# Patient Record
Sex: Female | Born: 1940 | Race: White | Hispanic: No | Smoking: Former smoker
Health system: Southern US, Community
[De-identification: ages and names within clinical notes are randomized; demographics above are authoritative.]

## PROBLEM LIST (undated history)

## (undated) DIAGNOSIS — K5792 Diverticulitis of intestine, part unspecified, without perforation or abscess without bleeding: Secondary | ICD-10-CM

## (undated) DIAGNOSIS — E559 Vitamin D deficiency, unspecified: Secondary | ICD-10-CM

## (undated) DIAGNOSIS — R011 Cardiac murmur, unspecified: Secondary | ICD-10-CM

## (undated) DIAGNOSIS — H409 Unspecified glaucoma: Secondary | ICD-10-CM

## (undated) DIAGNOSIS — K5790 Diverticulosis of intestine, part unspecified, without perforation or abscess without bleeding: Secondary | ICD-10-CM

## (undated) DIAGNOSIS — J189 Pneumonia, unspecified organism: Secondary | ICD-10-CM

## (undated) DIAGNOSIS — E782 Mixed hyperlipidemia: Secondary | ICD-10-CM

## (undated) DIAGNOSIS — F32A Depression, unspecified: Secondary | ICD-10-CM

## (undated) DIAGNOSIS — K219 Gastro-esophageal reflux disease without esophagitis: Secondary | ICD-10-CM

## (undated) DIAGNOSIS — E119 Type 2 diabetes mellitus without complications: Secondary | ICD-10-CM

## (undated) DIAGNOSIS — I10 Essential (primary) hypertension: Secondary | ICD-10-CM

## (undated) DIAGNOSIS — F329 Major depressive disorder, single episode, unspecified: Secondary | ICD-10-CM

## (undated) DIAGNOSIS — D649 Anemia, unspecified: Secondary | ICD-10-CM

## (undated) DIAGNOSIS — C801 Malignant (primary) neoplasm, unspecified: Secondary | ICD-10-CM

## (undated) DIAGNOSIS — D126 Benign neoplasm of colon, unspecified: Secondary | ICD-10-CM

## (undated) HISTORY — PX: ABDOMINAL HYSTERECTOMY: SHX81

## (undated) HISTORY — DX: Gastro-esophageal reflux disease without esophagitis: K21.9

## (undated) HISTORY — DX: Mixed hyperlipidemia: E78.2

## (undated) HISTORY — DX: Essential (primary) hypertension: I10

## (undated) HISTORY — DX: Diverticulitis of intestine, part unspecified, without perforation or abscess without bleeding: K57.92

## (undated) HISTORY — DX: Diverticulosis of intestine, part unspecified, without perforation or abscess without bleeding: K57.90

## (undated) HISTORY — DX: Type 2 diabetes mellitus without complications: E11.9

## (undated) HISTORY — PX: OTHER SURGICAL HISTORY: SHX169

## (undated) HISTORY — PX: LITHOTRIPSY: SUR834

## (undated) HISTORY — DX: Depression, unspecified: F32.A

## (undated) HISTORY — PX: BREAST BIOPSY: SHX20

## (undated) HISTORY — DX: Unspecified glaucoma: H40.9

## (undated) HISTORY — PX: EYE SURGERY: SHX253

## (undated) HISTORY — DX: Major depressive disorder, single episode, unspecified: F32.9

## (undated) HISTORY — DX: Vitamin D deficiency, unspecified: E55.9

## (undated) HISTORY — DX: Benign neoplasm of colon, unspecified: D12.6

## (undated) HISTORY — DX: Anemia, unspecified: D64.9

---

## 1998-08-14 ENCOUNTER — Emergency Department (HOSPITAL_COMMUNITY): Admission: EM | Admit: 1998-08-14 | Discharge: 1998-08-14 | Payer: Self-pay | Admitting: Emergency Medicine

## 1999-08-15 ENCOUNTER — Other Ambulatory Visit: Admission: RE | Admit: 1999-08-15 | Discharge: 1999-08-15 | Payer: Self-pay | Admitting: Gynecology

## 2000-01-11 ENCOUNTER — Ambulatory Visit (HOSPITAL_COMMUNITY): Admission: RE | Admit: 2000-01-11 | Discharge: 2000-01-11 | Payer: Self-pay | Admitting: Internal Medicine

## 2000-01-11 ENCOUNTER — Encounter: Payer: Self-pay | Admitting: Internal Medicine

## 2001-02-21 ENCOUNTER — Ambulatory Visit (HOSPITAL_COMMUNITY): Admission: RE | Admit: 2001-02-21 | Discharge: 2001-02-21 | Payer: Self-pay | Admitting: Internal Medicine

## 2001-02-21 ENCOUNTER — Encounter: Payer: Self-pay | Admitting: Internal Medicine

## 2003-04-09 ENCOUNTER — Encounter: Payer: Self-pay | Admitting: Internal Medicine

## 2003-04-09 ENCOUNTER — Ambulatory Visit (HOSPITAL_COMMUNITY): Admission: RE | Admit: 2003-04-09 | Discharge: 2003-04-09 | Payer: Self-pay | Admitting: Internal Medicine

## 2003-04-26 ENCOUNTER — Other Ambulatory Visit: Admission: RE | Admit: 2003-04-26 | Discharge: 2003-04-26 | Payer: Self-pay | Admitting: Gynecology

## 2004-04-12 ENCOUNTER — Ambulatory Visit (HOSPITAL_COMMUNITY): Admission: RE | Admit: 2004-04-12 | Discharge: 2004-04-12 | Payer: Self-pay | Admitting: Internal Medicine

## 2005-04-17 ENCOUNTER — Ambulatory Visit (HOSPITAL_COMMUNITY): Admission: RE | Admit: 2005-04-17 | Discharge: 2005-04-17 | Payer: Self-pay | Admitting: Internal Medicine

## 2005-06-23 ENCOUNTER — Emergency Department (HOSPITAL_COMMUNITY): Admission: EM | Admit: 2005-06-23 | Discharge: 2005-06-23 | Payer: Self-pay | Admitting: Emergency Medicine

## 2005-06-25 ENCOUNTER — Ambulatory Visit (HOSPITAL_COMMUNITY): Admission: RE | Admit: 2005-06-25 | Discharge: 2005-06-25 | Payer: Self-pay | Admitting: Internal Medicine

## 2005-08-13 ENCOUNTER — Ambulatory Visit (HOSPITAL_COMMUNITY): Admission: RE | Admit: 2005-08-13 | Discharge: 2005-08-13 | Payer: Self-pay | Admitting: Internal Medicine

## 2005-12-31 ENCOUNTER — Other Ambulatory Visit: Admission: RE | Admit: 2005-12-31 | Discharge: 2005-12-31 | Payer: Self-pay | Admitting: Gynecology

## 2006-04-22 ENCOUNTER — Ambulatory Visit (HOSPITAL_COMMUNITY): Admission: RE | Admit: 2006-04-22 | Discharge: 2006-04-22 | Payer: Self-pay | Admitting: Internal Medicine

## 2007-04-24 ENCOUNTER — Ambulatory Visit (HOSPITAL_COMMUNITY): Admission: RE | Admit: 2007-04-24 | Discharge: 2007-04-24 | Payer: Self-pay | Admitting: Internal Medicine

## 2007-07-02 ENCOUNTER — Ambulatory Visit: Payer: Self-pay | Admitting: Internal Medicine

## 2007-07-17 ENCOUNTER — Ambulatory Visit: Payer: Self-pay | Admitting: Internal Medicine

## 2007-07-17 ENCOUNTER — Encounter: Payer: Self-pay | Admitting: Internal Medicine

## 2008-06-14 ENCOUNTER — Ambulatory Visit (HOSPITAL_COMMUNITY): Admission: RE | Admit: 2008-06-14 | Discharge: 2008-06-14 | Payer: Self-pay | Admitting: Internal Medicine

## 2008-09-27 ENCOUNTER — Ambulatory Visit: Payer: Self-pay

## 2008-09-27 ENCOUNTER — Encounter (INDEPENDENT_AMBULATORY_CARE_PROVIDER_SITE_OTHER): Payer: Self-pay | Admitting: Internal Medicine

## 2010-05-10 ENCOUNTER — Ambulatory Visit (HOSPITAL_COMMUNITY): Admission: RE | Admit: 2010-05-10 | Discharge: 2010-05-10 | Payer: Self-pay | Admitting: Internal Medicine

## 2011-04-09 ENCOUNTER — Other Ambulatory Visit (HOSPITAL_COMMUNITY): Payer: Self-pay | Admitting: Orthopedic Surgery

## 2011-04-09 DIAGNOSIS — R52 Pain, unspecified: Secondary | ICD-10-CM

## 2011-04-12 ENCOUNTER — Ambulatory Visit (HOSPITAL_COMMUNITY)
Admission: RE | Admit: 2011-04-12 | Discharge: 2011-04-12 | Disposition: A | Discharge status: Home / Self Care | Payer: Medicare Other | Source: Ambulatory Visit | Attending: Orthopedic Surgery | Admitting: Orthopedic Surgery

## 2011-04-12 DIAGNOSIS — Z8781 Personal history of (healed) traumatic fracture: Secondary | ICD-10-CM | POA: Insufficient documentation

## 2011-04-12 DIAGNOSIS — M25579 Pain in unspecified ankle and joints of unspecified foot: Secondary | ICD-10-CM | POA: Insufficient documentation

## 2011-04-12 DIAGNOSIS — Z13820 Encounter for screening for osteoporosis: Secondary | ICD-10-CM | POA: Insufficient documentation

## 2011-04-12 DIAGNOSIS — R52 Pain, unspecified: Secondary | ICD-10-CM

## 2011-08-15 ENCOUNTER — Other Ambulatory Visit (HOSPITAL_COMMUNITY): Payer: Self-pay | Admitting: Internal Medicine

## 2011-08-15 ENCOUNTER — Ambulatory Visit (HOSPITAL_COMMUNITY)
Admission: RE | Admit: 2011-08-15 | Discharge: 2011-08-15 | Disposition: A | Discharge status: Home / Self Care | Payer: Medicare Other | Source: Ambulatory Visit | Attending: Internal Medicine | Admitting: Internal Medicine

## 2011-08-15 DIAGNOSIS — I10 Essential (primary) hypertension: Secondary | ICD-10-CM

## 2011-08-15 DIAGNOSIS — Z0000 Encounter for general adult medical examination without abnormal findings: Secondary | ICD-10-CM | POA: Insufficient documentation

## 2011-08-15 DIAGNOSIS — E119 Type 2 diabetes mellitus without complications: Secondary | ICD-10-CM | POA: Insufficient documentation

## 2011-08-21 ENCOUNTER — Encounter: Payer: Self-pay | Admitting: Internal Medicine

## 2011-10-03 ENCOUNTER — Other Ambulatory Visit: Payer: Self-pay | Admitting: Dermatology

## 2011-10-12 ENCOUNTER — Encounter: Payer: Self-pay | Admitting: Internal Medicine

## 2011-10-17 ENCOUNTER — Ambulatory Visit (INDEPENDENT_AMBULATORY_CARE_PROVIDER_SITE_OTHER): Payer: Medicare Other | Admitting: Internal Medicine

## 2011-10-17 ENCOUNTER — Encounter: Payer: Self-pay | Admitting: Internal Medicine

## 2011-10-17 VITALS — BP 132/70 | HR 77 | Ht 66.0 in | Wt 129.9 lb

## 2011-10-17 DIAGNOSIS — R195 Other fecal abnormalities: Secondary | ICD-10-CM

## 2011-10-17 MED ORDER — PEG-KCL-NACL-NASULF-NA ASC-C 100 G PO SOLR: 1.0000 | Freq: Once | ORAL | Status: DC

## 2011-10-17 NOTE — Progress Notes (Signed)
Brandy Short July 15, 1941 MRN [id number]   History of Present Illness:  This is a 70 year old white female with one out of three stool cards positive for blood. She denies visible blood per rectum or abdominal pain. She has occasional diarrhea which she attributes to irritable bowel syndrome. She is a diabetic on metformin which may be causing diarrhea. She had a colonoscopy in 2005 with findings of an adenomatous polyp. She also had severe diverticulosis of the sigmoid colon. A repeat colonoscopy in July 2008 showed a small polyp which was shown to be polypoid mucosa. She says that she collected her Hemoccult cards several days after she ate a rare roast beef. She also points out that she has some rectal itching at times which may be causing local irritation.    Past Medical History  Diagnosis Date  . Hypertension   . Type II or unspecified type diabetes mellitus without mention of complication, not stated as uncontrolled   . Mixed hyperlipidemia   . Depression   . Diverticulosis   . Colon polyps   . Asthma    Past Surgical History  Procedure Date  . Abdominal hysterectomy   . Breast biopsy   . Cyst removed from lower spine   . Lithotripsy     reports that she quit smoking about 12 years ago. She has never used smokeless tobacco. She reports that she drinks alcohol. She reports that she does not use illicit drugs. family history includes Breast cancer in her sister; Diabetes in her mother; and Hyperlipidemia in her sister. Allergies  Allergen Reactions  . Dilaudid (Hydromorphone Hcl)     rash  . Quinine Derivatives   . Sulfur     Breaks out into a rash  . Tetracyclines & Related     rash        Review of Systems: She denies any upper GI symptoms of heartburn epigastric pain all melenic stools  The remainder of the 10 point ROS is negative except as outlined in H&P   Physical Exam: General appearance  Well developed, in no distress. Eyes- non icteric. HEENT  nontraumatic, normocephalic. Mouth no lesions, tongue papillated, no cheilosis. Neck supple without adenopathy, thyroid not enlarged, no carotid bruits, no JVD. Lungs Clear to auscultation bilaterally. Cor normal S1, normal S2, regular rhythm, no murmur,  quiet precordium. Abdomen: Soft nontender with normal active bowel sounds. No tenderness. Liver edge at costal margin. Rectal: Normal perianal area. Normal rectal sphincter tone. Hemoccult negative stool. Extremities no pedal edema. Skin no lesions. Neurological alert and oriented x 3. Psychological normal mood and affect.  Assessment and Plan:  Problem # 1 Hemoccult-positive stool on a home test. Patient denies any visible blood. A positive test could not be reproduced on today's rectal exam. She has a personal history of adenomatous polyps. We have discussed at length advantages of colonoscopy versus virtual colonoscopy. We also considered the possibility of repeating the Hemoccult cards. Apparently, she is not anemic. She would like to go ahead and have a colonoscopy now instead of at a recall date in 2015. We have discussed her prep and also modification of her diabetic medications prior to the colonoscopy.   10/17/2011 [name]

## 2011-10-17 NOTE — Patient Instructions (Signed)
You have been scheduled for a colonoscopy. Please follow written instructions given to you at your visit today.  Please pick up your prep kit at the pharmacy within the next 2-3 days. CC: Dr Oneta Rack

## 2011-10-25 ENCOUNTER — Encounter: Payer: Self-pay | Admitting: Internal Medicine

## 2011-10-25 ENCOUNTER — Ambulatory Visit (AMBULATORY_SURGERY_CENTER): Payer: Medicare Other | Admitting: Internal Medicine

## 2011-10-25 VITALS — BP 139/68 | HR 67 | Temp 96.9°F | Resp 18 | Ht 66.0 in | Wt 129.0 lb

## 2011-10-25 DIAGNOSIS — K921 Melena: Secondary | ICD-10-CM

## 2011-10-25 DIAGNOSIS — D126 Benign neoplasm of colon, unspecified: Secondary | ICD-10-CM

## 2011-10-25 DIAGNOSIS — R195 Other fecal abnormalities: Secondary | ICD-10-CM

## 2011-10-25 LAB — GLUCOSE, CAPILLARY

## 2011-10-25 MED ORDER — SODIUM CHLORIDE 0.9 % IV SOLN: 500.0000 mL | INTRAVENOUS | Status: DC

## 2011-10-25 NOTE — Patient Instructions (Signed)
PLease read the handouts given to you by your recovery room nurse.    You need to increase the fiber in your diet due to the severe diverticulosis in your colon.   You may resume your routine medications today.    The polyp results will be mailed to you within two weeks.   Metamucil 1 tsp would be good for you every day.   Thank-you for choosing [facility] today for your medical needs.

## 2011-10-26 ENCOUNTER — Telehealth: Payer: Self-pay | Admitting: *Deleted

## 2011-10-26 NOTE — Telephone Encounter (Signed)

## 2011-10-30 ENCOUNTER — Encounter: Payer: Self-pay | Admitting: Internal Medicine

## 2011-11-07 ENCOUNTER — Telehealth: Payer: Self-pay | Admitting: Internal Medicine

## 2011-11-07 NOTE — Telephone Encounter (Signed)
Spoke with patient and reviewed letter she was sent re: pathology. Faxed a copy of path to Dr. Evern Bio as requested by patient.

## 2012-01-08 ENCOUNTER — Other Ambulatory Visit: Payer: Self-pay | Admitting: Dermatology

## 2012-06-04 ENCOUNTER — Other Ambulatory Visit: Payer: Self-pay

## 2012-06-17 ENCOUNTER — Other Ambulatory Visit: Payer: Self-pay | Admitting: Dermatology

## 2012-07-28 ENCOUNTER — Other Ambulatory Visit: Payer: Self-pay

## 2012-08-14 ENCOUNTER — Other Ambulatory Visit: Payer: Self-pay | Admitting: Dermatology

## 2012-08-29 ENCOUNTER — Other Ambulatory Visit: Payer: Self-pay

## 2012-09-04 ENCOUNTER — Other Ambulatory Visit: Payer: Self-pay | Admitting: Dermatology

## 2012-09-15 ENCOUNTER — Other Ambulatory Visit: Payer: Self-pay | Admitting: Dermatology

## 2012-09-24 ENCOUNTER — Other Ambulatory Visit: Payer: Self-pay | Admitting: Dermatology

## 2012-11-28 ENCOUNTER — Other Ambulatory Visit: Payer: Self-pay

## 2013-01-08 ENCOUNTER — Other Ambulatory Visit: Payer: Self-pay | Admitting: Dermatology

## 2013-01-28 ENCOUNTER — Other Ambulatory Visit: Payer: Self-pay

## 2013-02-16 ENCOUNTER — Other Ambulatory Visit: Payer: Self-pay | Admitting: Dermatology

## 2013-02-18 ENCOUNTER — Emergency Department (HOSPITAL_COMMUNITY)
Admission: EM | Admit: 2013-02-18 | Discharge: 2013-02-19 | Disposition: A | Discharge status: Home / Self Care | Payer: Medicare Other | Attending: Emergency Medicine | Admitting: Emergency Medicine

## 2013-02-18 ENCOUNTER — Encounter (HOSPITAL_COMMUNITY): Payer: Self-pay | Admitting: Emergency Medicine

## 2013-02-18 DIAGNOSIS — Y929 Unspecified place or not applicable: Secondary | ICD-10-CM | POA: Insufficient documentation

## 2013-02-18 DIAGNOSIS — W010XXA Fall on same level from slipping, tripping and stumbling without subsequent striking against object, initial encounter: Secondary | ICD-10-CM | POA: Insufficient documentation

## 2013-02-18 DIAGNOSIS — IMO0002 Reserved for concepts with insufficient information to code with codable children: Secondary | ICD-10-CM | POA: Insufficient documentation

## 2013-02-18 DIAGNOSIS — N390 Urinary tract infection, site not specified: Secondary | ICD-10-CM | POA: Insufficient documentation

## 2013-02-18 DIAGNOSIS — Z87891 Personal history of nicotine dependence: Secondary | ICD-10-CM | POA: Insufficient documentation

## 2013-02-18 DIAGNOSIS — Z860100 Personal history of colon polyps, unspecified: Secondary | ICD-10-CM | POA: Insufficient documentation

## 2013-02-18 DIAGNOSIS — Z79899 Other long term (current) drug therapy: Secondary | ICD-10-CM | POA: Insufficient documentation

## 2013-02-18 DIAGNOSIS — S0993XA Unspecified injury of face, initial encounter: Secondary | ICD-10-CM | POA: Insufficient documentation

## 2013-02-18 DIAGNOSIS — I10 Essential (primary) hypertension: Secondary | ICD-10-CM | POA: Insufficient documentation

## 2013-02-18 DIAGNOSIS — Z8659 Personal history of other mental and behavioral disorders: Secondary | ICD-10-CM | POA: Insufficient documentation

## 2013-02-18 DIAGNOSIS — Y939 Activity, unspecified: Secondary | ICD-10-CM | POA: Insufficient documentation

## 2013-02-18 DIAGNOSIS — E119 Type 2 diabetes mellitus without complications: Secondary | ICD-10-CM | POA: Insufficient documentation

## 2013-02-18 DIAGNOSIS — Z8719 Personal history of other diseases of the digestive system: Secondary | ICD-10-CM | POA: Insufficient documentation

## 2013-02-18 DIAGNOSIS — E782 Mixed hyperlipidemia: Secondary | ICD-10-CM | POA: Insufficient documentation

## 2013-02-18 DIAGNOSIS — J45909 Unspecified asthma, uncomplicated: Secondary | ICD-10-CM | POA: Insufficient documentation

## 2013-02-18 LAB — CBC
HCT: 34.7 % — ABNORMAL LOW (ref 36.0–46.0)
Hemoglobin: 12.2 g/dL (ref 12.0–15.0)
RBC: 3.68 MIL/uL — ABNORMAL LOW (ref 3.87–5.11)
WBC: 10.4 10*3/uL (ref 4.0–10.5)

## 2013-02-18 LAB — BASIC METABOLIC PANEL
BUN: 30 mg/dL — ABNORMAL HIGH (ref 6–23)
CO2: 23 mEq/L (ref 19–32)
Chloride: 101 mEq/L (ref 96–112)
Glucose, Bld: 206 mg/dL — ABNORMAL HIGH (ref 70–99)
Potassium: 4.4 mEq/L (ref 3.5–5.1)

## 2013-02-18 LAB — URINE MICROSCOPIC-ADD ON

## 2013-02-18 LAB — URINALYSIS, ROUTINE W REFLEX MICROSCOPIC
Bilirubin Urine: NEGATIVE
Specific Gravity, Urine: 1.015 (ref 1.005–1.030)
pH: 6 (ref 5.0–8.0)

## 2013-02-18 NOTE — ED Notes (Addendum)
Patient fell today while walking; denies loss of consciousness.  Patient hypertensive; has history of hypertension (patient has not taken her evening dose of hypertension medications).  Patient has abrasions to head (superficial abrasion), right pinky, right knee, and left side of face/lip.  Patient denies any pain.  Denies dizziness.  Patient alert and oriented x4; PERRL present.  Patient ambulatory on scene.  Patient was driven to fire station by family member.  Hand grips and foot pushes are bilaterally equal and strong; no facial droop present.

## 2013-02-19 ENCOUNTER — Emergency Department (HOSPITAL_COMMUNITY): Payer: Medicare Other

## 2013-02-19 ENCOUNTER — Encounter (HOSPITAL_COMMUNITY): Payer: Self-pay | Admitting: Radiology

## 2013-02-19 LAB — GLUCOSE, CAPILLARY: Glucose-Capillary: 172 mg/dL — ABNORMAL HIGH (ref 70–99)

## 2013-02-19 MED ORDER — CEPHALEXIN 500 MG PO CAPS: 500.0000 mg | ORAL_CAPSULE | Freq: Four times a day (QID) | ORAL | Status: DC

## 2013-02-19 NOTE — ED Provider Notes (Signed)
History     CSN: [id number]  Arrival date & time 02/18/13  1925   First MD Initiated Contact with Patient 02/19/13 [phone]      Chief Complaint  Patient presents with  . Fall    (Consider location/radiation/quality/duration/timing/severity/associated sxs/prior treatment) Patient is a 72 y.o. female presenting with fall. The history is provided by the patient and a relative.  Fall   patient here complaining of head and face pain after fall today when she tripped. No loss of consciousness. Complains of pain to the top of her scalp as well as her mid face. No blurred vision. Denies any neck pain. No upper lower extremity weakness. Denies any abdominal or chest pain. Denies any dysuria or hematuria. Nausea used prior to arrival. Patient unable to have a tetanus shot  Past Medical History  Diagnosis Date  . Hypertension   . Type II or unspecified type diabetes mellitus without mention of complication, not stated as uncontrolled   . Mixed hyperlipidemia   . Depression   . Diverticulosis   . Colon polyps   . Asthma     Past Surgical History  Procedure Laterality Date  . Abdominal hysterectomy    . Breast biopsy    . Cyst removed from lower spine    . Lithotripsy      Family History  Problem Relation Age of Onset  . Breast cancer Sister   . Diabetes Mother     brother and sisters  . Hyperlipidemia Sister     History  Substance Use Topics  . Smoking status: Former Smoker    Quit date: 10/17/1999  . Smokeless tobacco: Never Used  . Alcohol Use: Yes     Comment: ocassional    OB History   Grav Para Term Preterm Abortions TAB SAB Ect Mult Living                  Review of Systems  All other systems reviewed and are negative.    Allergies  Dilaudid; Losartan; Sulfur; Tetracyclines & related; and Quinine derivatives  Home Medications   Current Outpatient Rx  Name  Route  Sig  Dispense  Refill  . acidophilus (RISAQUAD) CAPS   Oral   Take 1 capsule by mouth  daily.           Marland Kitchen amLODipine (NORVASC) 10 MG tablet   Oral   Take 10 mg by mouth daily.           . bisoprolol (ZEBETA) 10 MG tablet   Oral   Take 5 mg by mouth daily.          . cholecalciferol (VITAMIN D) 1000 UNITS tablet   Oral   Take 1,000 Units by mouth daily.           Marland Kitchen glipiZIDE (GLUCOTROL) 10 MG tablet   Oral   Take 10 mg by mouth 2 (two) times daily before a meal.           . hydrochlorothiazide (HYDRODIURIL) 25 MG tablet   Oral   Take 25 mg by mouth daily.         . MetFORMIN HCl (FORTAMET PO)   Oral   Take 500 mg by mouth 4 (four) times daily.          . mometasone (ASMANEX) 220 MCG/INH inhaler   Inhalation   Inhale 2 puffs into the lungs 2 (two) times daily as needed (for congestion due to allergies).         Marland Kitchen  phenylephrine (SUDAFED PE) 10 MG TABS   Oral   Take 10 mg by mouth every 4 (four) hours as needed (for nasal congestion).         . Red Yeast Rice 600 MG CAPS   Oral   Take 600 mg by mouth daily.           . sitaGLIPtin (JANUVIA) 100 MG tablet   Oral   Take 100 mg by mouth daily.           . vitamin B-12 (CYANOCOBALAMIN) 100 MCG tablet   Oral   Take 50 mcg by mouth daily.           . ONE TOUCH ULTRA TEST test strip                 BP 168/72  Pulse 73  Temp(Src) 97.2 F (36.2 C) (Oral)  Resp 16  SpO2 96%  Physical Exam  Nursing note and vitals reviewed. Constitutional: She is oriented to person, place, and time. She appears well-developed and well-nourished.  Non-toxic appearance. No distress.  HENT:  Head: Normocephalic and atraumatic.  Multiple facial contusions along with abrasion to top of head.  Eyes: Conjunctivae, EOM and lids are normal. Pupils are equal, round, and reactive to light.  Neck: Normal range of motion. Neck supple. No tracheal deviation present. No mass present.  Cardiovascular: Normal rate, regular rhythm and normal heart sounds.  Exam reveals no gallop.   No murmur  heard. Pulmonary/Chest: Effort normal and breath sounds normal. No stridor. No respiratory distress. She has no decreased breath sounds. She has no wheezes. She has no rhonchi. She has no rales.  Abdominal: Soft. Normal appearance and bowel sounds are normal. She exhibits no distension. There is no tenderness. There is no rebound and no CVA tenderness.  Musculoskeletal: Normal range of motion. She exhibits no edema and no tenderness.  Neurological: She is alert and oriented to person, place, and time. She has normal strength. No cranial nerve deficit or sensory deficit. GCS eye subscore is 4. GCS verbal subscore is 5. GCS motor subscore is 6.  Skin: Skin is warm and dry. No abrasion and no rash noted.  Psychiatric: She has a normal mood and affect. Her speech is normal and behavior is normal.    ED Course  Procedures (including critical care time)  Labs Reviewed  GLUCOSE, CAPILLARY - Abnormal; Notable for the following:    Glucose-Capillary 184 (*)    All other components within normal limits  CBC - Abnormal; Notable for the following:    RBC 3.68 (*)    HCT 34.7 (*)    All other components within normal limits  BASIC METABOLIC PANEL - Abnormal; Notable for the following:    Glucose, Bld 206 (*)    BUN 30 (*)    GFR calc non Af Amer 55 (*)    GFR calc Af Amer 64 (*)    All other components within normal limits  URINALYSIS, ROUTINE W REFLEX MICROSCOPIC - Abnormal; Notable for the following:    APPearance CLOUDY (*)    Hgb urine dipstick TRACE (*)    Protein, ur >300 (*)    Leukocytes, UA TRACE (*)    All other components within normal limits  URINE MICROSCOPIC-ADD ON - Abnormal; Notable for the following:    Bacteria, UA MANY (*)    Casts HYALINE CASTS (*)    All other components within normal limits  URINE CULTURE   No results found.  No diagnosis found.    MDM  Patient with negative workup here with the exception of urinary tract infection. She is stable for  discharge        Toy Baker, MD 02/19/13 [phone]

## 2013-02-20 LAB — URINE CULTURE

## 2013-02-21 ENCOUNTER — Telehealth (HOSPITAL_COMMUNITY): Payer: Self-pay | Admitting: Emergency Medicine

## 2013-02-21 NOTE — ED Notes (Signed)
+   urine culture. Treated with Keflex, sensitive to same per protocol MD. 

## 2013-07-08 ENCOUNTER — Encounter: Payer: Self-pay | Admitting: *Deleted

## 2013-08-04 ENCOUNTER — Other Ambulatory Visit: Payer: Self-pay

## 2013-08-11 ENCOUNTER — Other Ambulatory Visit (INDEPENDENT_AMBULATORY_CARE_PROVIDER_SITE_OTHER): Payer: Medicare Other

## 2013-08-11 ENCOUNTER — Ambulatory Visit (INDEPENDENT_AMBULATORY_CARE_PROVIDER_SITE_OTHER): Payer: Medicare Other | Admitting: Internal Medicine

## 2013-08-11 ENCOUNTER — Encounter: Payer: Self-pay | Admitting: Internal Medicine

## 2013-08-11 VITALS — BP 110/60 | HR 60 | Ht 66.0 in | Wt 118.4 lb

## 2013-08-11 DIAGNOSIS — D509 Iron deficiency anemia, unspecified: Secondary | ICD-10-CM

## 2013-08-11 LAB — CBC WITH DIFFERENTIAL/PLATELET
Basophils Relative: 0.3 % (ref 0.0–3.0)
Eosinophils Absolute: 0.2 10*3/uL (ref 0.0–0.7)
Eosinophils Relative: 2.9 % (ref 0.0–5.0)
Hemoglobin: 11.8 g/dL — ABNORMAL LOW (ref 12.0–15.0)
Lymphocytes Relative: 13.6 % (ref 12.0–46.0)
MCHC: 34.5 g/dL (ref 30.0–36.0)
Monocytes Relative: 4.9 % (ref 3.0–12.0)
Neutro Abs: 5.4 10*3/uL (ref 1.4–7.7)
Neutrophils Relative %: 78.3 % — ABNORMAL HIGH (ref 43.0–77.0)
RBC: 3.52 Mil/uL — ABNORMAL LOW (ref 3.87–5.11)
WBC: 6.9 10*3/uL (ref 4.5–10.5)

## 2013-08-11 LAB — IBC PANEL: Transferrin: 216.7 mg/dL (ref 212.0–360.0)

## 2013-08-11 NOTE — Patient Instructions (Addendum)
You have been scheduled for an endoscopy with propofol. Please follow written instructions given to you at your visit today. If you use inhalers (even only as needed), please bring them with you on the day of your procedure. Your physician has requested that you go to www.[email or web address].com and enter the access code given to you at your visit today. This web site gives a general overview about your procedure. However, you should still follow specific instructions given to you by our office regarding your preparation for the procedure.  Your physician has requested that you go to the basement for the following lab work before leaving today: Celiac 10, IBC, CBC  CC:Dr Lucky Cowboy

## 2013-08-11 NOTE — Progress Notes (Signed)
Brandy Short January 03, 1941 MRN [id number]  History of Present Illness:  This is a 72 year old white female who is here today to evaluate  a drop in her hemoglobin and hematocrit. In December 2013, her hemoglobin was 11.6. Currently, it is 10.6 with  hematocrit is 31.3. Iron saturation was 21% in December 2013. She denies visible blood per rectum. She has normal bowel habits 3 times a day.  She gives a history of a hiatal hernia ,she has never had an endoscopy. She has occasional dysphagia. She takes over-the-counter antacids for indigestion and reflux. A screening colonoscopy in 2005 showed adenomatous polyps. In 2008, she had a hyperplastic polyp and most recently, in November 2012, she had severe diverticulosis of the left colon and a tubular adenoma which was removed. There is no family history of colon cancer. She denies a history of anemia.   Past Medical History  Diagnosis Date  . Hypertension   . Type II or unspecified type diabetes mellitus without mention of complication, not stated as uncontrolled   . Mixed hyperlipidemia   . Depression   . Diverticulosis   . Adenomatous colon polyp   . Asthma    Past Surgical History  Procedure Laterality Date  . Abdominal hysterectomy    . Breast biopsy    . Cyst removed from lower spine    . Lithotripsy      reports that she quit smoking about 13 years ago. She has never used smokeless tobacco. She reports that  drinks alcohol. She reports that she does not use illicit drugs. family history includes Breast cancer in her sister; Diabetes in her brother, mother, and sister; Hyperlipidemia in her sister. Allergies  Allergen Reactions  . Dilaudid [Hydromorphone Hcl]     rash  . Sulfur     Breaks out into a rash  . Tetracyclines & Related     rash  . Quinine Derivatives Rash        Review of Systems: Positive for intermittent dysphagia and reflux, also mild weight loss  The remainder of the 10 point ROS is negative except as outlined in  H&P   Physical Exam: General appearance  Well developed, in no distress. Eyes- non icteric. HEENT nontraumatic, normocephalic. Mouth no lesions, tongue papillated, no cheilosis. Neck supple without adenopathy, thyroid not enlarged, no carotid bruits, no JVD. Lungs Clear to auscultation bilaterally. Mild kyphosis Cor normal S1, normal S2, regular rhythm, no murmur,  quiet precordium. Abdomen: Soft nontender with hyperactive bowel sounds. No tenderness. No palpable mass. Liver edge at costal margin. Rectal: Normal rectal sphincter tone. Normal perianal area. Soft Hemoccult negative stool. Extremities no pedal edema. Skin no lesions. Dupuytren's contractures. Neurological alert and oriented x 3. Psychological normal mood and affect.  Assessment and Plan:  Problem #53 72 year old white female with decrease in her hemoglobin, she is hemoccult negative  on my exam today. She had a colonoscopy in 2012 for Hemoccult-positive stool but no underlying reason was found. She has a history of adenomatous polyps. She is having upper GI symptoms suggestive of a hiatal hernia and reflux which could potentially be the source of her low-grade GI blood loss. She has mild weight loss which she attributes to dietary modifications as a diabetic. We will proceed with an upper endoscopy and small bowel biopsy as well as appropriate biopsies from the stomach or esophagus as indicated. I will also check her sprue profile today and make sure she can absorb the iron. We will repeat her iron levels today.  I told her to continue taking the antacids and will consider using PPI or H2RA depending on the findings on upper endoscopy.   08/11/2013 Brandy Short

## 2013-08-12 LAB — CELIAC PANEL 10
Gliadin IgG: 4.2 U/mL (ref <20)
IgA: 274 mg/dL (ref 69–380)

## 2013-08-21 ENCOUNTER — Ambulatory Visit (AMBULATORY_SURGERY_CENTER): Payer: Medicare Other | Admitting: Internal Medicine

## 2013-08-21 ENCOUNTER — Encounter: Payer: Self-pay | Admitting: Internal Medicine

## 2013-08-21 VITALS — BP 155/70 | HR 85 | Temp 98.1°F | Resp 20 | Ht 66.0 in | Wt 118.0 lb

## 2013-08-21 DIAGNOSIS — K2960 Other gastritis without bleeding: Secondary | ICD-10-CM

## 2013-08-21 DIAGNOSIS — D509 Iron deficiency anemia, unspecified: Secondary | ICD-10-CM

## 2013-08-21 DIAGNOSIS — D131 Benign neoplasm of stomach: Secondary | ICD-10-CM

## 2013-08-21 DIAGNOSIS — D1330 Benign neoplasm of unspecified part of small intestine: Secondary | ICD-10-CM

## 2013-08-21 DIAGNOSIS — K222 Esophageal obstruction: Secondary | ICD-10-CM

## 2013-08-21 MED ORDER — SODIUM CHLORIDE 0.9 % IV SOLN: 500.0000 mL | INTRAVENOUS | Status: DC

## 2013-08-21 MED ORDER — RANITIDINE HCL 150 MG PO TABS: 150.0000 mg | ORAL_TABLET | Freq: Every day | ORAL | Status: DC

## 2013-08-21 NOTE — Patient Instructions (Addendum)
YOU HAD AN ENDOSCOPIC PROCEDURE TODAY AT THE [facility]: Refer to the procedure report that was given to you for any specific questions about what was found during the examination.  If the procedure report does not answer your questions, please call your gastroenterologist to clarify.  If you requested that your care partner not be given the details of your procedure findings, then the procedure report has been included in a sealed envelope for you to review at your convenience later.  YOU SHOULD EXPECT: Some feelings of bloating in the abdomen. Passage of more gas than usual.  Walking can help get rid of the air that was put into your GI tract during the procedure and reduce the bloating. If you had a lower endoscopy (such as a colonoscopy or flexible sigmoidoscopy) you may notice spotting of blood in your stool or on the toilet paper. If you underwent a bowel prep for your procedure, then you may not have a normal bowel movement for a few days.  DIET: FOLLOW DILATATION DIET GIVEN TO YOU TODAY , TOMORROW MAY RETURN TO USUAL DIET   ACTIVITY: Your care partner should take you home directly after the procedure.  You should plan to take it easy, moving slowly for the rest of the day.  You can resume normal activity the day after the procedure however you should NOT DRIVE or use heavy machinery for 24 hours (because of the sedation medicines used during the test).    SYMPTOMS TO REPORT IMMEDIATELY: A gastroenterologist can be reached at any hour.  During normal business hours, 8:30 AM to 5:00 PM Monday through Friday, call [phone].  After hours and on weekends, please call the GI answering service at [phone] who will take a message and have the physician on call contact you.   Following lower endoscopy (colonoscopy or flexible sigmoidoscopy):  Excessive amounts of blood in the stool  Significant tenderness or worsening of abdominal pains  Swelling of the abdomen that is new,  acute  Fever of 100F or higher  Following upper endoscopy (EGD)  Vomiting of blood or coffee ground material  New chest pain or pain under the shoulder blades  Painful or persistently difficult swallowing  New shortness of breath  Fever of 100F or higher  Black, tarry-looking stools  FOLLOW UP: If any biopsies were taken you will be contacted by phone or by letter within the next 1-3 weeks.  Call your gastroenterologist if you have not heard about the biopsies in 3 weeks.  Our staff will call the home number listed on your records the next business day following your procedure to check on you and address any questions or concerns that you may have at that time regarding the information given to you following your procedure. This is a courtesy call and so if there is no answer at the home number and we have not heard from you through the emergency physician on call, we will assume that you have returned to your regular daily activities without incident.  SIGNATURES/CONFIDENTIALITY: You and/or your care partner have signed paperwork which will be entered into your electronic medical record.  These signatures attest to the fact that that the information above on your After Visit Summary has been reviewed and is understood.  Full responsibility of the confidentiality of this discharge information lies with you and/or your care-partner.   INFORMATION ON HIATAL HERNIA  GIVEN TO YOU TODAY  DILATATION DIET( LIQUIDS THEN SOFT FOODS THE REST OF TODAY )  GIVEN TO YOU TO FOLLOW TODAY DUE TO DILATION OF YOUR ESOPHAGUS  RANITIDINE (ZANTAC) RX SENT TO YOUR [facility] [address] , TO TAKE ONE EACH NIGHT

## 2013-08-21 NOTE — Progress Notes (Signed)
Called to room to assist during endoscopic procedure.  Patient ID and intended procedure confirmed with present staff. Received instructions for my participation in the procedure from the performing physician.  

## 2013-08-21 NOTE — Progress Notes (Signed)
Patient did not experience any of the following events: a burn prior to discharge; a fall within the facility; wrong site/side/patient/procedure/implant event; or a hospital transfer or hospital admission upon discharge from the facility. (G8907) Patient did not have preoperative order for IV antibiotic SSI prophylaxis. (G8918)  

## 2013-08-21 NOTE — Op Note (Signed)
[facility] [address] [place], [address]   ENDOSCOPY PROCEDURE REPORT  PATIENT: Brandy, Short  MR#: [id number] BIRTHDATE: Aug 31, 1941 , 72  yrs. old GENDER: Female ENDOSCOPIST: Hart Carwin, MD REFERRED BY:  Lucky Cowboy, M.D. PROCEDURE DATE:  08/21/2013 PROCEDURE:  EGD w/ biopsy and Maloney dilation of esophagus ASA CLASS:     Class III INDICATIONS:  Unexplained iron deficiency anemia.   Hgb has dropped from 11.6 to 10.6, stool is hemoccult negative, Iron saturation 21%, she is up to date on colonoscopy ( 2012).. MEDICATIONS: MAC sedation, administered by CRNA and Fentanyl-Detailed 120 mg IV TOPICAL ANESTHETIC: none  DESCRIPTION OF PROCEDURE: After the risks benefits and alternatives of the procedure were thoroughly explained, informed consent was obtained.  The LB [id number] endoscope was introduced through the mouth and advanced to the second portion of the duodenum. Without limitations.  The instrument was slowly withdrawn as the mucosa was fully examined.     esophagus: Esophageal mucosa appeared normal in the proximal and mid esophagus. There was a mild esophageal stricture at the GE junction which allowed the endoscope to traverse into a hiatal hernia. With several Cameron erosions in the hiatal hernia which was sliding between 0-4 cm, biopsies were taken from the [facility] erosions Stomach: Gastric by gastric antrum appeared normal. there was a bile reflux with coating of the gastric mucosa but the bile.  Gastric outlet was patent. The retroflexion confirmed presence of small hiatal hernia Duodenum duodenal bulb and descending duodenum was normal. Biopsies were frank taken from second portion duodenum to rule out villous atrophy[          The scope was then withdrawn from the patient and the procedure completed. Maloney dilators 48 [place] passed through the esophageal stricture without resistance. There was small amount of blood on  the dilator  COMPLICATIONS: There were no complications. ENDOSCOPIC IMPRESSION: 3-4 cm sliding hiatal hernia with Sheria Lang erosions she may because of the GI blood loss That is post biopsies from a gastric cardia Normal descending duodenum. Status post biopsies to rule out sprue Mild nonobstructing esophageal stricture. Status post passage of 51 [place] Maloney dilator RECOMMENDATIONS: Await biopsy results antireflux measures cont meds Iron supplements, monitor H/H  REPEAT EXAM: no recall  eSigned:  Hart Carwin, MD 08/21/2013 10:12 AM   CC:  PATIENT NAME:  Brandy, Short MR#: [id number]

## 2013-08-21 NOTE — Progress Notes (Signed)
No egg or soy allergy. ewm No problems with past sedation. ewm 

## 2013-08-21 NOTE — Progress Notes (Signed)
Report to pacu rn, vss, bbs=clear 

## 2013-08-25 ENCOUNTER — Telehealth: Payer: Self-pay

## 2013-08-25 NOTE — Telephone Encounter (Signed)
  Follow up Call-  Call back number 08/21/2013 10/25/2011  Post procedure Call Back phone  # [phone]  Permission to leave phone message Yes -     Patient questions:  Do you have a fever, pain , or abdominal swelling? no Pain Score  0 *  Have you tolerated food without any problems? yes  Have you been able to return to your normal activities? yes  Do you have any questions about your discharge instructions: Diet   no Medications  no Follow up visit  no  Do you have questions or concerns about your Care? no  Actions: * If pain score is 4 or above: No action needed, pain <4.

## 2013-08-27 ENCOUNTER — Encounter: Payer: Self-pay | Admitting: Internal Medicine

## 2013-09-01 ENCOUNTER — Telehealth: Payer: Self-pay | Admitting: Internal Medicine

## 2013-09-01 NOTE — Telephone Encounter (Signed)
Spoke with patient and sent procedure note and letter to Dr. Oneta Rack via [employer].

## 2013-10-21 ENCOUNTER — Other Ambulatory Visit: Payer: Self-pay | Admitting: Internal Medicine

## 2013-10-31 ENCOUNTER — Encounter: Payer: Self-pay | Admitting: Internal Medicine

## 2013-10-31 DIAGNOSIS — I10 Essential (primary) hypertension: Secondary | ICD-10-CM | POA: Insufficient documentation

## 2013-10-31 DIAGNOSIS — K219 Gastro-esophageal reflux disease without esophagitis: Secondary | ICD-10-CM | POA: Insufficient documentation

## 2013-10-31 DIAGNOSIS — E782 Mixed hyperlipidemia: Secondary | ICD-10-CM | POA: Insufficient documentation

## 2013-10-31 DIAGNOSIS — E559 Vitamin D deficiency, unspecified: Secondary | ICD-10-CM | POA: Insufficient documentation

## 2013-10-31 DIAGNOSIS — H409 Unspecified glaucoma: Secondary | ICD-10-CM | POA: Insufficient documentation

## 2013-10-31 DIAGNOSIS — D649 Anemia, unspecified: Secondary | ICD-10-CM | POA: Insufficient documentation

## 2013-11-02 ENCOUNTER — Other Ambulatory Visit: Payer: Self-pay | Admitting: Internal Medicine

## 2013-11-03 ENCOUNTER — Ambulatory Visit: Payer: Medicare Other | Admitting: Internal Medicine

## 2013-11-03 ENCOUNTER — Encounter: Payer: Self-pay | Admitting: Internal Medicine

## 2013-11-03 VITALS — BP 132/70 | HR 68 | Temp 97.9°F | Resp 16 | Ht 65.0 in | Wt 119.0 lb

## 2013-11-03 DIAGNOSIS — Z4802 Encounter for removal of sutures: Secondary | ICD-10-CM

## 2013-11-03 NOTE — Patient Instructions (Signed)
Observe biopsy site fir recurrence and return if questions  Basal Cell Carcinoma Basal cell carcinoma is the most common form of skin cancer. It begins in the basal cells, which are at the bottom of the outer skin layer (epidermis). CAUSES  Sun exposure is the most common cause of basal cell carcinoma. Basal cell carcinoma occurs most often on parts of the body that are frequently exposed to the sun, including the:  Scalp.  Ears.  Neck.  Face.  Arms.  Backs of the hands.  Legs. However, basal cell carcinoma can occur anywhere on the body. Rarely, tumors develop on areas not exposed to the sun. Other causes of basal cell carcinoma can include:  Exposure to arsenic.  Exposure to radiation.  Certain genetic syndromes, such as xeroderma pigmentosum. RISK FACTORS People at highest risk for basal cell carcinoma include those with:  Fair skin.  Blonde or red hair.  Blue, green, or gray eyes.  Childhood freckling. Factors that increase your risk for basal cell carcinoma include:  Sun exposure over long periods of time. Childhood sun exposure appears to be a more significant factor than sun exposure as an adult.  Repeated sunburns.  Use of tanning beds.  Having a weakened immune system. SYMPTOMS Five signs of basal cell carcinoma are:  An open sore that bleeds, oozes, or crusts. The sore may remain open for 3 or more weeks. This can be an early sign of basal cell carcinoma. Basal cell carcinoma can mimic a pimple that will not heal.  A reddish or irritated area which may crust, itch, or cause discomfort. This may occur on areas expose d to the sun. These patches might be easier felt than seen.  A shiny, pearly, or translucent bump that is pink, red, or white. The bump may also be tan, black, or brown, especially in dark haired people. These bumps can be confused with moles.  A pink growth with a slightly elevated, rolled border, and a crusted indentation in the center.  As the growth slowly enlarges, tiny blood vessels may develop on the surface.  A scar-like white, yellow, or waxy area that looks like shiny, stretched skin. It often has irregular borders. This may be a sign of more aggressive basal cell carcinoma. DIAGNOSIS  Your caregiver may be able to tell what is wrong by doing a physical exam. Often, a tissue sample (biopsy) is also taken. The tissue is examined under a microscope.  TREATMENT  The treatment for basal cell carcinoma depends on the type, size, location, and number of tumors. Possible treatments include:   Mohs surgery. This is a procedure done by a skin doctor (dermatologist or Mohs surgeon) in his or her office. The cancerous cells are removed layer by layer. This treatment has a high cure rate.  Surgical removal of the tumor.  Freezing the tumor with liquid nitrogen (cryosurgery).  Plastic surgery to remove the tumor, in the case of large tumors.  Radiation. This may be used for tumors on the face.  Photodynamic therapy. A chemical cream is applied to the skin and light exposure is used to activate the chemical.  Chemical treatments, such as imiquimod cream and interferon injections. This may be used to remove superficial tumors with minimal scarring.  Electrodesiccation and curettage. This involves alternately scraping and burning the tumor, using an electric current to control bleeding. Basal cell carcinoma can almost always be cured. It rarely spreads to other areas of the body (metastasizes). Basal cell carcinoma may come back  at the same location (recur), but it can be treated again if this occurs. PREVENTION  Avoid the sun between 10:00 a.m. and 4:00 pm when it is the strongest.  Use a sunscreen or sunblock with a sun protection factor of 30 or greater.  Apply sunscreen at least 30 minutes before exposure to the sun.  Reapply sunscreen every 2 to 4 hours while you are outside, after swimming, and after excessive  sweating.  Always wear protective hats, clothing, and sunglasses with ultraviolet protection.  Avoid tanning beds. HOME CARE INSTRUCTIONS   Avoid unprotected sun exposure.  Follow your caregiver's instructions for self-exams. Look for new spots or changes in your skin.  Keep all follow-up appointments as directed by your caregiver. SEEK MEDICAL CARE IF:   You notice any new spots or changes in your skin.  You have had a basal cell carcinoma tumor removed and you notice a new growth in the same location. Document Released: 06/16/2003 Document Revised: 06/10/2012 Document Reviewed: 09/03/2011 [facility] Patient Information 2014 [place], [place].

## 2013-11-03 NOTE — Progress Notes (Signed)
Patient ID: Brandy Short, female   DOB: [birth year], 72 y.o.   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 The wound is well healed without signs of infection.  The sutures are removed. Wound care and activity instructions given. Return prn.

## 2013-11-16 ENCOUNTER — Ambulatory Visit: Payer: Medicare Other | Admitting: Emergency Medicine

## 2013-11-16 ENCOUNTER — Encounter: Payer: Self-pay | Admitting: Emergency Medicine

## 2013-11-16 VITALS — BP 138/62 | HR 78 | Temp 98.2°F | Resp 18 | Ht 65.0 in | Wt 117.0 lb

## 2013-11-16 DIAGNOSIS — J209 Acute bronchitis, unspecified: Secondary | ICD-10-CM

## 2013-11-16 DIAGNOSIS — J329 Chronic sinusitis, unspecified: Secondary | ICD-10-CM

## 2013-11-16 DIAGNOSIS — J309 Allergic rhinitis, unspecified: Secondary | ICD-10-CM

## 2013-11-16 MED ORDER — FEXOFENADINE HCL 60 MG PO TABS: 60.0000 mg | ORAL_TABLET | Freq: Two times a day (BID) | ORAL | Status: DC

## 2013-11-16 MED ORDER — PREDNISONE 10 MG PO TABS: ORAL_TABLET | ORAL | Status: DC

## 2013-11-16 MED ORDER — AZITHROMYCIN 250 MG PO TABS: ORAL_TABLET | ORAL | Status: AC

## 2013-11-16 MED ORDER — BENZONATATE 100 MG PO CAPS: 100.0000 mg | ORAL_CAPSULE | Freq: Three times a day (TID) | ORAL | Status: DC | PRN

## 2013-11-16 MED ORDER — ALBUTEROL SULFATE HFA 108 (90 BASE) MCG/ACT IN AERS: 2.0000 | INHALATION_SPRAY | Freq: Four times a day (QID) | RESPIRATORY_TRACT | Status: DC | PRN

## 2013-11-16 NOTE — Patient Instructions (Signed)
Sinusitis Sinusitis is redness, soreness, and puffiness (inflammation) of the air pockets in the bones of your face (sinuses). The redness, soreness, and puffiness can cause air and mucus to get trapped in your sinuses. This can allow germs to grow and cause an infection.  HOME CARE   Drink enough fluids to keep your pee (urine) clear or pale yellow.  Use a humidifier in your home.  Run a hot shower to create steam in the bathroom. Sit in the bathroom with the door closed. Breathe in the steam 3 4 times a day.  Put a warm, moist washcloth on your face 3 4 times a day, or as told by your doctor.  Use salt water sprays (saline sprays) to wet the thick fluid in your nose. This can help the sinuses drain.  Only take medicine as told by your doctor. GET HELP RIGHT AWAY IF:   Your pain gets worse.  You have very bad headaches.  You are sick to your stomach (nauseous).  You throw up (vomit).  You are very sleepy (drowsy) all the time.  Your face is puffy (swollen).  Your vision changes.  You have a stiff neck.  You have trouble breathing. MAKE SURE YOU:   Understand these instructions.  Will watch your condition.  Will get help right away if you are not doing well or get worse. Document Released: 05/28/2008 Document Revised: 09/03/2012 Document Reviewed: 07/15/2012 [employer] Patient Information 2014 [employer].  

## 2013-11-16 NOTE — Progress Notes (Signed)
Subjective:    Patient ID: Brandy Short, female    DOB: [birth year], 72 y.o.   MRN: [id number]  HPI Comments: 1 week of increased allergy drainage was clear and has now turned green. She has noticed increased sinus pressure behind eyes and forehead. She has a dry cough with occasional wheeze.  No relief with sudafed.  Sinusitis Associated symptoms include coughing, headaches and sinus pressure.  Headache  Associated symptoms include coughing and sinus pressure.  Cough Associated symptoms include headaches and postnasal drip.    Current Outpatient Prescriptions on File Prior to Visit  Medication Sig Dispense Refill  . acidophilus (RISAQUAD) CAPS Take 1 capsule by mouth daily.       . betamethasone dipropionate (DIPROLENE) 0.05 % cream Apply topically 2 (two) times daily.       . bisoprolol (ZEBETA) 10 MG tablet Take 5 mg by mouth daily.       . cholecalciferol (VITAMIN D) 1000 UNITS tablet Take 1,000 Units by mouth daily. Takes 3 qd      . clobetasol (TEMOVATE) 0.05 % external solution Apply 1 application topically 2 (two) times daily.       . dorzolamide-timolol (COSOPT) 22.3-6.8 MG/ML ophthalmic solution Place 1 drop into the left eye 2 (two) times daily.       Marland Kitchen glipiZIDE (GLUCOTROL) 10 MG tablet Take 10 mg by mouth 2 (two) times daily before a meal.        . hydrochlorothiazide (HYDRODIURIL) 25 MG tablet Take 25 mg by mouth daily.      Marland Kitchen losartan (COZAAR) 100 MG tablet Take 100 mg by mouth daily.       . MetFORMIN HCl (FORTAMET PO) Take 500 mg by mouth 4 (four) times daily.       . mometasone (ASMANEX) 220 MCG/INH inhaler Inhale 2 puffs into the lungs 2 (two) times daily as needed (for congestion due to allergies).      . ONE TOUCH ULTRA TEST test strip       . phenylephrine (SUDAFED PE) 10 MG TABS Take 10 mg by mouth every 4 (four) hours as needed (for nasal congestion).      . ranitidine (ZANTAC) 150 MG tablet Take 1 tablet (150 mg total) by mouth at bedtime.  30 tablet  6  . Red  Yeast Rice 600 MG CAPS Take 600 mg by mouth daily.        . vitamin B-12 (CYANOCOBALAMIN) 100 MCG tablet Take 50 mcg by mouth daily. Takes 3 qd       No current facility-administered medications on file prior to visit.   ALLERGIES Ace inhibitors; Actonel; Dilaudid; Hyosol-sl; Metformin and related; Phenobarbital; Sulfur; Tetracyclines & related; and Quinine derivatives  Past Medical History  Diagnosis Date  . Depression   . Diverticulosis   . Adenomatous colon polyp   . Asthma   . Hypertension   . Mixed hyperlipidemia   . Type II or unspecified type diabetes mellitus without mention of complication, not stated as uncontrolled   . Anemia   . GERD (gastroesophageal reflux disease)   . Vitamin D deficiency   . Glaucoma   . Diverticulitis      Review of Systems  HENT: Positive for postnasal drip and sinus pressure.   Respiratory: Positive for cough.   Neurological: Positive for headaches.  All other systems reviewed and are negative.    BP 138/62  Pulse 78  Temp(Src) 98.2 F (36.8 C) (Temporal)  Resp 18  Ht 5\' 5"  (  1.651 m)  Wt 117 lb (53.071 kg)  BMI 19.47 kg/m2     Objective:   Physical Exam  Nursing note and vitals reviewed. Constitutional: She appears well-developed and well-nourished.  HENT:  Head: Normocephalic and atraumatic.  Right Ear: External ear normal.  Left Ear: External ear normal.  Nose: Nose normal.  Mouth/Throat: Oropharynx is clear and moist.  Yellow TMs, frontal/ maxillary tenderness  Eyes: Pupils are equal, round, and reactive to light.  Neck: Normal range of motion.  Cardiovascular: Normal rate, regular rhythm, normal heart sounds and intact distal pulses.   Pulmonary/Chest: Effort normal. She has wheezes.  Musculoskeletal: Normal range of motion.  Lymphadenopathy:    She has no cervical adenopathy.  Neurological: She is alert.  Skin: Skin is warm and dry.  Psychiatric: She has a normal mood and affect. Judgment normal.           Assessment & Plan:  Sinus/ bronchitis/ allergic rhinitis-  Zpak/ Tesalo perles, Allegra, Alb inhaler AD. Increase H2o W/C if symptoms increase

## 2013-11-26 ENCOUNTER — Ambulatory Visit (INDEPENDENT_AMBULATORY_CARE_PROVIDER_SITE_OTHER): Payer: Medicare Other | Admitting: Physician Assistant

## 2013-11-26 ENCOUNTER — Encounter: Payer: Self-pay | Admitting: Physician Assistant

## 2013-11-26 VITALS — BP 118/68 | HR 60 | Temp 98.4°F | Resp 16 | Ht 65.0 in | Wt 116.0 lb

## 2013-11-26 DIAGNOSIS — E782 Mixed hyperlipidemia: Secondary | ICD-10-CM

## 2013-11-26 DIAGNOSIS — E559 Vitamin D deficiency, unspecified: Secondary | ICD-10-CM

## 2013-11-26 DIAGNOSIS — E119 Type 2 diabetes mellitus without complications: Secondary | ICD-10-CM

## 2013-11-26 DIAGNOSIS — I10 Essential (primary) hypertension: Secondary | ICD-10-CM

## 2013-11-26 LAB — HEPATIC FUNCTION PANEL
ALT: 10 U/L (ref 0–35)
Albumin: 3.8 g/dL (ref 3.5–5.2)
Alkaline Phosphatase: 67 U/L (ref 39–117)
Total Bilirubin: 0.6 mg/dL (ref 0.3–1.2)
Total Protein: 6.2 g/dL (ref 6.0–8.3)

## 2013-11-26 LAB — BASIC METABOLIC PANEL WITH GFR
BUN: 22 mg/dL (ref 6–23)
CO2: 27 mEq/L (ref 19–32)
Calcium: 9.3 mg/dL (ref 8.4–10.5)
GFR, Est African American: 68 mL/min
Glucose, Bld: 207 mg/dL — ABNORMAL HIGH (ref 70–99)

## 2013-11-26 LAB — LIPID PANEL
Cholesterol: 185 mg/dL (ref 0–200)
HDL: 47 mg/dL (ref >39)
Total CHOL/HDL Ratio: 3.9 Ratio
Triglycerides: 176 mg/dL — ABNORMAL HIGH (ref <150)

## 2013-11-26 LAB — CBC WITH DIFFERENTIAL/PLATELET
Basophils Relative: 1 % (ref 0–1)
Eosinophils Absolute: 0.1 10*3/uL (ref 0.0–0.7)
HCT: 34 % — ABNORMAL LOW (ref 36.0–46.0)
Hemoglobin: 11.5 g/dL — ABNORMAL LOW (ref 12.0–15.0)
Lymphocytes Relative: 15 % (ref 12–46)
MCH: 33.1 pg (ref 26.0–34.0)
MCHC: 33.8 g/dL (ref 30.0–36.0)
Monocytes Absolute: 0.4 10*3/uL (ref 0.1–1.0)
Monocytes Relative: 6 % (ref 3–12)
Neutrophils Relative %: 76 % (ref 43–77)
RDW: 13.4 % (ref 11.5–15.5)
WBC: 6.8 10*3/uL (ref 4.0–10.5)

## 2013-11-26 LAB — HEMOGLOBIN A1C: Mean Plasma Glucose: 258 mg/dL — ABNORMAL HIGH (ref <117)

## 2013-11-26 NOTE — Progress Notes (Signed)
HPI Patient presents for 3 month follow up with hypertension, hyperlipidemia, prediabetes and vitamin D. Patient's blood pressure has been controlled at home. Patient denies chest pain, shortness of breath, dizziness.  Patient's cholesterol is diet controlled. In addition they are on RYRE  and denies myalgias. The cholesterol last visit was LDL 93. The patient has been working on diet and exercise for prediabetes, and denies changes in vision, polys, and paresthesias. Patient states that her sugars have been higher in the morning since she has had her sinus infection and was treated for that here. She never took the prednisone that she was given and states it is doing better. A1C 8.3. She has increased her glipizide to 10mg  TID.Sugars at home have been 160's.   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  Patient is on Vitamin D supplement.  Current Medications:  Current Outpatient Prescriptions on File Prior to Visit  Medication Sig Dispense Refill  . acidophilus (RISAQUAD) CAPS Take 1 capsule by mouth daily.       Marland Kitchen albuterol (PROVENTIL HFA;VENTOLIN HFA) 108 (90 BASE) MCG/ACT inhaler Inhale 2 puffs into the lungs every 6 (six) hours as needed for wheezing or shortness of breath.  1 Inhaler  2  . benzonatate (TESSALON PERLES) 100 MG capsule Take 1 capsule (100 mg total) by mouth 3 (three) times daily as needed for cough.  42 capsule  1  . betamethasone dipropionate (DIPROLENE) 0.05 % cream Apply topically 2 (two) times daily.       . bisoprolol (ZEBETA) 10 MG tablet Take 5 mg by mouth daily.       . cholecalciferol (VITAMIN D) 1000 UNITS tablet Take 1,000 Units by mouth daily. Takes 3 qd      . clobetasol (TEMOVATE) 0.05 % external solution Apply 1 application topically 2 (two) times daily.       . dorzolamide-timolol (COSOPT) 22.3-6.8 MG/ML ophthalmic solution Place 1 drop into the left eye 2 (two) times daily.       . fexofenadine (ALLEGRA) 60 MG tablet Take 1 tablet (60 mg total) by  mouth 2 (two) times daily.  60 tablet  2  . glipiZIDE (GLUCOTROL) 10 MG tablet Take 10 mg by mouth 2 (two) times daily before a meal.        . hydrochlorothiazide (HYDRODIURIL) 25 MG tablet Take 25 mg by mouth daily.      Marland Kitchen losartan (COZAAR) 100 MG tablet Take 100 mg by mouth daily.       . MetFORMIN HCl (FORTAMET PO) Take 500 mg by mouth 4 (four) times daily.       . mometasone (ASMANEX) 220 MCG/INH inhaler Inhale 2 puffs into the lungs 2 (two) times daily as needed (for congestion due to allergies).      . ONE TOUCH ULTRA TEST test strip       . phenylephrine (SUDAFED PE) 10 MG TABS Take 10 mg by mouth every 4 (four) hours as needed (for nasal congestion).      . predniSONE (DELTASONE) 10 MG tablet 1 po TID x 3days, 1 po BID x 3 days, 1 po QD x 5days  20 tablet  0  . ranitidine (ZANTAC) 150 MG tablet Take 1 tablet (150 mg total) by mouth at bedtime.  30 tablet  6  . Red Yeast Rice 600 MG CAPS Take 600 mg by mouth daily.        . vitamin B-12 (CYANOCOBALAMIN) 100 MCG tablet Take 50 mcg by mouth daily.  Takes 3 qd       No current facility-administered medications on file prior to visit.   Medical History:  Past Medical History  Diagnosis Date  . Depression   . Diverticulosis   . Adenomatous colon polyp   . Asthma   . Hypertension   . Mixed hyperlipidemia   . Type II or unspecified type diabetes mellitus without mention of complication, not stated as uncontrolled   . Anemia   . GERD (gastroesophageal reflux disease)   . Vitamin D deficiency   . Glaucoma   . Diverticulitis    Allergies:  Allergies  Allergen Reactions  . Ace Inhibitors   . Actonel [Risedronate Sodium]     Dyspepsia  . Dilaudid [Hydromorphone Hcl]     rash  . Hyosol-Sl [Hyoscyamine]     Dry mouth  . Metformin And Related     Diarrhea  . Phenobarbital   . Sulfur     Breaks out into a rash  . Tetracyclines & Related     rash  . Quinine Derivatives Rash    ROS Constitutional: Denies fever, chills, weight  loss/gain, headaches, insomnia, fatigue, night sweats, and change in appetite. Eyes: Denies redness, blurred vision, diplopia, discharge, itchy, watery eyes.  ENT: Denies discharge, congestion, post nasal drip, sore throat, earache, dental pain, Tinnitus, Vertigo, Sinus pain, snoring.  Cardio: Denies chest pain, palpitations, irregular heartbeat,  dyspnea, diaphoresis, orthopnea, PND, claudication, edema Respiratory: denies cough, dyspnea,pleurisy, hoarseness, wheezing.  Gastrointestinal: Denies dysphagia, heartburn,  water brash, pain, cramps, nausea, vomiting, bloating, diarrhea, constipation, hematemesis, melena, hematochezia,  hemorrhoids Genitourinary: Denies dysuria, frequency, urgency, nocturia, hesitancy, discharge, hematuria, flank pain Musculoskeletal: Denies arthralgia, myalgia, stiffness, Jt. Swelling, pain, limp, and strain/sprain. Skin: Denies pruritis, rash, hives, warts, acne, eczema, changing in skin lesion Neuro: Denies Weakness, tremor, incoordination, spasms, paresthesia, pain Psychiatric: Denies confusion, memory loss, sensory loss Endocrine: Denies change in weight, skin, hair change, nocturia, and paresthesia, Diabetic Polys, Denies visual blurring, hyper /hypo glycemic episodes.  Heme/Lymph: Denies Excessive bleeding, bruising, enlarged lymph nodes  Family history- Review and unchanged Social history- Review and unchanged Physical Exam: Filed Vitals:   11/26/13 1004  BP: 118/68  Pulse: 60  Temp: 98.4 F (36.9 C)  Resp: 16   Filed Weights   11/26/13 1004  Weight: 116 lb (52.617 kg)   General Appearance: Well nourished, in no apparent distress. Eyes: PERRLA, EOMs, conjunctiva no swelling or erythema, normal fundi and vessels. Sinuses: No Frontal/maxillary tenderness ENT/Mouth: Ext aud canals clear, with TMs without erythema, bulging.No erythema, swelling, or exudate on post pharynx.  Tonsils not swollen or erythematous. Hearing normal.  Neck: Supple, thyroid  normal.  Respiratory: Respiratory effort normal, BS equal bilaterally without rales, rhonchi, wheezing or stridor.  Cardio: Heart sounds normal, regular rate and rhythm without murmurs, rubs or gallops. Peripheral pulses brisk and equal bilaterally, without edema.  Abdomen: Flat, soft, with bowel sounds. Non tender, no guarding, rebound, hernias, masses, or organomegaly.  Lymphatics: Non tender without lymphadenopathy.  Musculoskeletal: Full ROM all peripheral extremities, joint stability, 5/5 strength, and normal gait. Skin: Warm, dry without rashes, lesions, ecchymosis.  Neuro: Cranial nerves intact, reflexes equal bilaterally. Normal muscle tone, no cerebellar symptoms. Sensation intact.  Psych: Awake and oriented X 3, normal affect, Insight and Judgment appropriate.   Assessment and Plan:  Hypertension: Continue medication, monitor blood pressure at home. Continue DASH diet. Cholesterol: Continue diet and exercise. Check cholesterol.  Pre-diabetes-Long discussion on diet and exercise. Check A1C Vitamin D Def- check  level and continue medications.  Hypomag- can not tolerate pill will look into getting a liquid form online.   Quentin Mulling 10:28 AM

## 2013-11-26 NOTE — Patient Instructions (Addendum)
   Bad carbs also include fruit juice, alcohol, and sweet tea. These are empty calories that do not signal to your brain that you are full.   Please remember the good carbs are still carbs which convert into sugar. So please measure them out no more than 1/2-1 cup of rice, oatmeal, pasta, and beans.  Veggies are however free foods! Pile them on.   I like lean protein at every meal such as chicken, [place], pork chops, cottage cheese, etc. Just do not fry these meats and please center your meal around vegetable, the meats should be a side dish.   No all fruit is created equal. Please see the list below, the fruit at the bottom is higher in sugars than the fruit at the top   Look into buying liquid magnesium can find it in a spray or a liquid online.

## 2013-11-27 LAB — INSULIN, FASTING: Insulin fasting, serum: 8 u[IU]/mL (ref 3–28)

## 2014-01-01 ENCOUNTER — Other Ambulatory Visit: Payer: Self-pay

## 2014-01-06 ENCOUNTER — Ambulatory Visit: Payer: Self-pay | Admitting: Physician Assistant

## 2014-01-07 ENCOUNTER — Ambulatory Visit (INDEPENDENT_AMBULATORY_CARE_PROVIDER_SITE_OTHER): Payer: Medicare Other | Admitting: Physician Assistant

## 2014-01-07 ENCOUNTER — Encounter: Payer: Self-pay | Admitting: Physician Assistant

## 2014-01-07 VITALS — BP 140/70 | HR 72 | Temp 98.1°F | Resp 16 | Ht 65.0 in | Wt 117.0 lb

## 2014-01-07 DIAGNOSIS — I10 Essential (primary) hypertension: Secondary | ICD-10-CM

## 2014-01-07 DIAGNOSIS — E782 Mixed hyperlipidemia: Secondary | ICD-10-CM

## 2014-01-07 DIAGNOSIS — E559 Vitamin D deficiency, unspecified: Secondary | ICD-10-CM

## 2014-01-07 DIAGNOSIS — E119 Type 2 diabetes mellitus without complications: Secondary | ICD-10-CM

## 2014-01-07 NOTE — Patient Instructions (Signed)
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
## 2014-01-20 ENCOUNTER — Emergency Department (HOSPITAL_COMMUNITY): Payer: Medicare Other

## 2014-01-20 ENCOUNTER — Encounter: Payer: Self-pay | Admitting: Internal Medicine

## 2014-01-20 ENCOUNTER — Emergency Department (HOSPITAL_COMMUNITY)
Admission: EM | Admit: 2014-01-20 | Discharge: 2014-01-20 | Discharge status: Against Medical Advice | Payer: Medicare Other | Attending: Emergency Medicine | Admitting: Emergency Medicine

## 2014-01-20 ENCOUNTER — Ambulatory Visit (INDEPENDENT_AMBULATORY_CARE_PROVIDER_SITE_OTHER): Payer: Medicare Other | Admitting: Internal Medicine

## 2014-01-20 ENCOUNTER — Encounter (HOSPITAL_COMMUNITY): Payer: Self-pay | Admitting: Emergency Medicine

## 2014-01-20 ENCOUNTER — Other Ambulatory Visit: Payer: Self-pay | Admitting: Internal Medicine

## 2014-01-20 VITALS — BP 156/80 | HR 84 | Temp 98.6°F | Resp 18 | Wt 118.2 lb

## 2014-01-20 DIAGNOSIS — E1165 Type 2 diabetes mellitus with hyperglycemia: Secondary | ICD-10-CM

## 2014-01-20 DIAGNOSIS — E119 Type 2 diabetes mellitus without complications: Secondary | ICD-10-CM | POA: Insufficient documentation

## 2014-01-20 DIAGNOSIS — J45909 Unspecified asthma, uncomplicated: Secondary | ICD-10-CM

## 2014-01-20 DIAGNOSIS — J209 Acute bronchitis, unspecified: Secondary | ICD-10-CM

## 2014-01-20 DIAGNOSIS — R0602 Shortness of breath: Secondary | ICD-10-CM

## 2014-01-20 DIAGNOSIS — R059 Cough, unspecified: Secondary | ICD-10-CM

## 2014-01-20 DIAGNOSIS — J3489 Other specified disorders of nose and nasal sinuses: Secondary | ICD-10-CM | POA: Insufficient documentation

## 2014-01-20 DIAGNOSIS — R509 Fever, unspecified: Secondary | ICD-10-CM | POA: Insufficient documentation

## 2014-01-20 DIAGNOSIS — R05 Cough: Secondary | ICD-10-CM

## 2014-01-20 DIAGNOSIS — E559 Vitamin D deficiency, unspecified: Secondary | ICD-10-CM | POA: Insufficient documentation

## 2014-01-20 DIAGNOSIS — Z860100 Personal history of colon polyps, unspecified: Secondary | ICD-10-CM | POA: Insufficient documentation

## 2014-01-20 DIAGNOSIS — I10 Essential (primary) hypertension: Secondary | ICD-10-CM | POA: Insufficient documentation

## 2014-01-20 DIAGNOSIS — Z87891 Personal history of nicotine dependence: Secondary | ICD-10-CM | POA: Insufficient documentation

## 2014-01-20 DIAGNOSIS — E860 Dehydration: Secondary | ICD-10-CM

## 2014-01-20 DIAGNOSIS — Z79899 Other long term (current) drug therapy: Secondary | ICD-10-CM | POA: Insufficient documentation

## 2014-01-20 DIAGNOSIS — IMO0002 Reserved for concepts with insufficient information to code with codable children: Secondary | ICD-10-CM | POA: Insufficient documentation

## 2014-01-20 DIAGNOSIS — IMO0001 Reserved for inherently not codable concepts without codable children: Secondary | ICD-10-CM

## 2014-01-20 DIAGNOSIS — H40009 Preglaucoma, unspecified, unspecified eye: Secondary | ICD-10-CM | POA: Insufficient documentation

## 2014-01-20 DIAGNOSIS — R9431 Abnormal electrocardiogram [ECG] [EKG]: Secondary | ICD-10-CM | POA: Insufficient documentation

## 2014-01-20 DIAGNOSIS — Z862 Personal history of diseases of the blood and blood-forming organs and certain disorders involving the immune mechanism: Secondary | ICD-10-CM | POA: Insufficient documentation

## 2014-01-20 DIAGNOSIS — J45901 Unspecified asthma with (acute) exacerbation: Secondary | ICD-10-CM | POA: Insufficient documentation

## 2014-01-20 DIAGNOSIS — K219 Gastro-esophageal reflux disease without esophagitis: Secondary | ICD-10-CM | POA: Insufficient documentation

## 2014-01-20 DIAGNOSIS — Z8659 Personal history of other mental and behavioral disorders: Secondary | ICD-10-CM | POA: Insufficient documentation

## 2014-01-20 LAB — BASIC METABOLIC PANEL
BUN: 24 mg/dL — ABNORMAL HIGH (ref 6–23)
CO2: 22 meq/L (ref 19–32)
Calcium: 8.5 mg/dL (ref 8.4–10.5)
Chloride: 98 mEq/L (ref 96–112)
Creatinine, Ser: 1.16 mg/dL — ABNORMAL HIGH (ref 0.50–1.10)
GFR calc Af Amer: 53 mL/min — ABNORMAL LOW (ref >90)
GFR calc non Af Amer: 46 mL/min — ABNORMAL LOW (ref >90)
Glucose, Bld: 313 mg/dL — ABNORMAL HIGH (ref 70–99)
POTASSIUM: 4.2 meq/L (ref 3.7–5.3)
SODIUM: 136 meq/L — AB (ref 137–147)

## 2014-01-20 LAB — TROPONIN I

## 2014-01-20 LAB — CBC
HEMATOCRIT: 30.9 % — AB (ref 36.0–46.0)
HEMOGLOBIN: 10.5 g/dL — AB (ref 12.0–15.0)
MCH: 32.9 pg (ref 26.0–34.0)
MCHC: 34 g/dL (ref 30.0–36.0)
MCV: 96.9 fL (ref 78.0–100.0)
Platelets: 327 10*3/uL (ref 150–400)
RBC: 3.19 MIL/uL — AB (ref 3.87–5.11)
RDW: 12.7 % (ref 11.5–15.5)
WBC: 11.7 10*3/uL — ABNORMAL HIGH (ref 4.0–10.5)

## 2014-01-20 LAB — GLUCOSE, CAPILLARY: Glucose-Capillary: 300 mg/dL — ABNORMAL HIGH (ref 70–99)

## 2014-01-20 LAB — PRO B NATRIURETIC PEPTIDE: Pro B Natriuretic peptide (BNP): 5546 pg/mL — ABNORMAL HIGH (ref 0–125)

## 2014-01-20 LAB — D-DIMER, QUANTITATIVE (NOT AT ARMC): D DIMER QUANT: 1.28 ug{FEU}/mL — AB (ref 0.00–0.48)

## 2014-01-20 MED ORDER — IOHEXOL 350 MG/ML SOLN
80.0000 mL | Freq: Once | INTRAVENOUS | Status: AC | PRN
  Administered 2014-01-20: 80 mL via INTRAVENOUS

## 2014-01-20 MED ORDER — PREDNISONE 20 MG PO TABS: 20.0000 mg | ORAL_TABLET | Freq: Every day | ORAL | Status: DC

## 2014-01-20 MED ORDER — SODIUM CHLORIDE 0.9 % IV BOLUS (SEPSIS)
500.0000 mL | Freq: Once | INTRAVENOUS | Status: AC
  Administered 2014-01-20: 500 mL via INTRAVENOUS

## 2014-01-20 MED ORDER — LEVOFLOXACIN 500 MG PO TABS: 500.0000 mg | ORAL_TABLET | Freq: Every day | ORAL | Status: AC

## 2014-01-20 MED ORDER — ALBUTEROL SULFATE (2.5 MG/3ML) 0.083% IN NEBU
5.0000 mg | INHALATION_SOLUTION | Freq: Once | RESPIRATORY_TRACT | Status: AC
  Administered 2014-01-20: 5 mg via RESPIRATORY_TRACT
  Filled 2014-01-20: qty 6

## 2014-01-20 NOTE — Progress Notes (Signed)
   Subjective:    Patient ID: Brandy Short, female    DOB: [birth year], 73 y.o.   MRN: [id number]  Cough Associated symptoms include chest pain, chills, a fever, headaches, myalgias, nasal congestion, rhinorrhea, a sore throat, shortness of breath and wheezing. Pertinent negatives include no ear pain or hemoptysis. Her past medical history is significant for asthma.  Fever  Associated symptoms include chest pain, congestion, coughing, headaches, a sore throat and wheezing. Pertinent negatives include no ear pain.  Asthma She complains of chest tightness, cough, difficulty breathing, shortness of breath, sputum production and wheezing. There is no frequent throat clearing, hemoptysis or hoarse voice. This is a recurrent problem. The current episode started in the past 7 days. The problem occurs 2 to 4 times per day. The problem has been gradually worsening. The cough is productive of sputum, barking, croupy, dry and hacking. Associated symptoms include chest pain, a fever, headaches, malaise/fatigue, myalgias, nasal congestion, rhinorrhea, a sore throat and trouble swallowing. Pertinent negatives include no ear pain. Her past medical history is significant for asthma.      Review of Systems  Constitutional: Positive for fever, chills, malaise/fatigue, diaphoresis and fatigue.  HENT: Positive for congestion, rhinorrhea, sinus pressure, sore throat, trouble swallowing and voice change. Negative for ear discharge, ear pain, facial swelling, hoarse voice, mouth sores and nosebleeds.   Eyes: Negative.   Respiratory: Positive for cough, sputum production, chest tightness, shortness of breath and wheezing. Negative for apnea, hemoptysis, choking and stridor.   Cardiovascular: Positive for chest pain.  Gastrointestinal: Negative.   Endocrine: Negative.   Genitourinary: Negative.   Musculoskeletal: Positive for myalgias.  Neurological: Positive for headaches.       Objective:   Physical Exam   Constitutional: She appears distressed.  HENT:  Head: Normocephalic and atraumatic.  Eyes: EOM are normal. Pupils are equal, round, and reactive to light. Right eye exhibits no discharge. Left eye exhibits no discharge.  Neck: Normal range of motion. Neck supple. No JVD present. No thyromegaly present.  Cardiovascular: Normal rate and regular rhythm.   No murmur heard. Pulmonary/Chest: No stridor. No respiratory distress. She has wheezes. She has rales. She exhibits tenderness.  Abdominal: Soft. She exhibits no distension and no mass. There is no tenderness. There is no rebound and no guarding.  Lymphadenopathy:    She has no cervical adenopathy.  Neurological: No cranial nerve deficit. Coordination normal.  Seemed somewhat dulled   Skin: Skin is warm and dry. No rash noted. She is not diaphoretic. No erythema.   Stat glucose was 354 mg% O2 sat was 97 % on Rm Air  Assessment & Plan:   Patient was felt to be dehydrated and possibly in early DKA with possible PNA vs Asthmatic TracheoBronchitis and was not felt alert enough to drive, so EMS was called for transport to the ER and apparently she had labs, Chest CTscan and was given IVF, but declined admission and signed out of the ER AMA. Sh e the returned to the office feeling somewhat better after the IVF and was given an RX Levaquin 500 mg x 10 days and Prednisone pulse/taper.

## 2014-01-20 NOTE — ED Notes (Signed)
Patient presents today via EMS from her Dr's office with a chief complaint of cough and shortness of breath. MD office sent patient due to elevated blood pressure in 579'J systolic. Patient alert and orientedx4 and denies chest pain. Patient also reports elevated blood sugar over past 3 months and reports compliance with medication.

## 2014-01-20 NOTE — ED Notes (Signed)
Bed: YM41 Expected date:  Expected time:  Means of arrival:  Comments: htn 362 cbg

## 2014-01-20 NOTE — ED Provider Notes (Signed)
CSN: [id number]     Arrival date & time 01/20/14  1246 History   First MD Initiated Contact with Patient 01/20/14 1255     Chief Complaint  Patient presents with  . Cough   (Consider location/radiation/quality/duration/timing/severity/associated sxs/prior Treatment) HPI Pt presents with c/o cough and nasal congestion.  She states she has had symptoms for the past 3-4 days with some mild shortness of breath.  She feels she may have had low grade fever as well.  Some hoarse voice.   She states she has had contact with multiple other people with similar symptoms.  No leg swelling, denies chest pain.  No vomiting or diarrhea.  She reports her blood sugar has not been under good control over the past several months.  She states she has been taking her medications regularly and was given a rx by her doctor for new DM med.  She was seen by PMD this morning and sent to the ED due to elevated blood glucose and hypertension.  There are no other associated systemic symptoms, there are no other alleviating or modifying factors.   Past Medical History  Diagnosis Date  . Depression   . Diverticulosis   . Adenomatous colon polyp   . Asthma   . Hypertension   . Mixed hyperlipidemia   . Type II or unspecified type diabetes mellitus without mention of complication, not stated as uncontrolled   . Anemia   . GERD (gastroesophageal reflux disease)   . Vitamin D deficiency   . Glaucoma   . Diverticulitis    Past Surgical History  Procedure Laterality Date  . Abdominal hysterectomy    . Breast biopsy    . Cyst removed from lower spine    . Lithotripsy    . Eye surgery Bilateral 09/2003 Dr. Katy Fitch     Glaucoma   Family History  Problem Relation Age of Onset  . Breast cancer Sister   . Diabetes Sister   . Heart disease Sister   . Diabetes Mother   . Hypertension Mother   . Heart disease Mother   . Hyperlipidemia Sister   . Arthritis Brother   . Diabetes Sister   . Esophageal cancer Neg Hx   .  Rectal cancer Neg Hx   . Stomach cancer Neg Hx   . Colon cancer Neg Hx   . Heart disease Father   . Diabetes Brother    History  Substance Use Topics  . Smoking status: Former Smoker    Quit date: 12/24/2002  . Smokeless tobacco: Never Used  . Alcohol Use: Yes     Comment: ocassional   OB History   Grav Para Term Preterm Abortions TAB SAB Ect Mult Living                 Review of Systems ROS reviewed and all otherwise negative except for mentioned in HPI  Allergies  Actonel; Hyosol-sl; Metformin and related; Phenobarbital; Ace inhibitors; Dilaudid; Quinine derivatives; Sulfur; and Tetracyclines & related  Home Medications   Current Outpatient Rx  Name  Route  Sig  Dispense  Refill  . acidophilus (RISAQUAD) CAPS   Oral   Take 1 capsule by mouth daily.          Marland Kitchen albuterol (PROVENTIL HFA;VENTOLIN HFA) 108 (90 BASE) MCG/ACT inhaler   Inhalation   Inhale 1 puff into the lungs 2 (two) times daily as needed for wheezing or shortness of breath.         . betamethasone dipropionate (  DIPROLENE) 0.05 % cream   Topical   Apply 1 application topically daily. Uses for skin cancer         . bisoprolol (ZEBETA) 10 MG tablet   Oral   Take 5 mg by mouth daily.          . brimonidine (ALPHAGAN) 0.15 % ophthalmic solution      1 drop.          . cholecalciferol (VITAMIN D) 1000 UNITS tablet   Oral   Take 1,000 Units by mouth daily. Takes 3 qd         . clobetasol (TEMOVATE) 0.05 % external solution   Topical   Apply 1 application topically 2 (two) times daily.          . dorzolamide-timolol (COSOPT) 22.3-6.8 MG/ML ophthalmic solution   Left Eye   Place 1 drop into the left eye 2 (two) times daily.          Marland Kitchen glipiZIDE (GLUCOTROL) 10 MG tablet   Oral   Take 10 mg by mouth 2 (two) times daily before a meal.           . hydrochlorothiazide (HYDRODIURIL) 25 MG tablet   Oral   Take 25 mg by mouth daily.         Marland Kitchen losartan (COZAAR) 100 MG tablet    Oral   Take 100 mg by mouth daily.          Marland Kitchen LUMIGAN 0.01 % SOLN               . metFORMIN (GLUCOPHAGE) 500 MG tablet   Oral   Take 500 mg by mouth 4 (four) times daily.         . mometasone (ASMANEX) 220 MCG/INH inhaler   Inhalation   Inhale 2 puffs into the lungs 2 (two) times daily as needed (for congestion due to allergies).         . phenylephrine (SUDAFED PE) 10 MG TABS   Oral   Take 10 mg by mouth every 4 (four) hours as needed (for nasal congestion).         . ranitidine (ZANTAC) 150 MG tablet   Oral   Take 1 tablet (150 mg total) by mouth at bedtime.   30 tablet   6   . Red Yeast Rice 600 MG CAPS   Oral   Take 600 mg by mouth daily.           . saxagliptin HCl (ONGLYZA) 5 MG TABS tablet   Oral   Take 5 mg by mouth daily.         . vitamin B-12 (CYANOCOBALAMIN) 100 MCG tablet   Oral   Take 50 mcg by mouth daily. Takes 3 qd          BP 164/104  Temp(Src) 98.2 F (36.8 C) (Oral)  Resp 20  SpO2 98% Vitals reviewed Physical Exam Physical Examination: General appearance - alert, well appearing, and in no distress Mental status - alert, oriented to person, place, and time Eyes - no conjunctival injection, no scleral icterus Mouth - mucous membranes moist, pharynx normal without lesions Chest - clear to auscultation, no wheezes, rales or rhonchi, symmetric air entry Heart - normal rate, regular rhythm, normal S1, S2, no murmurs, rubs, clicks or gallops Abdomen - soft, nontender, nondistended, no masses or organomegaly Neurological - alert, oriented x 3, normal speech, normal gait Extremities - peripheral pulses normal, no pedal edema, no clubbing or cyanosis  Skin - normal coloration and turgor, no rashes psych- normal mood and affect  ED Course  Procedures (including critical care time)  3:49 PM pt requesting to leave the ED.  When I went to talk with her she was standing up in the room pulling her EKG stickers off.  I have explained her  results to her and my concerns about the ST changes on her EKG.  I have advised that she remain for hospitalization for further evaluation.  Pt was visibly agitated, she said she wanted to leave and go to be seen at Dr. Idell Pickles office.  I reminded her that their office was who sent her to the ED. She states that all they wanted was for her to have a chest xray.  She states she has put money down on a car and that she is afraid she will lose the car.  She asked if someone can drive her back to Dr. Jacelyn Pi office.  I asked if there was someone she could call for help or how we could assist her and re-iterated that she may be having a heart attack and it would be safest to stay for admission.  She continued to say "this is the worst day of my life" and that she would get better care at Dr. Jacelyn Pi office.  I tried multiple times to explain the workup in the ED, the reasons for the workup, her risk factors for heart disease, the results.  She then said "Lady, I'm not naive, I have got to leave".  I stressed to her that we are only trying to make sure she is as healthy as possible and she was welcome to return to the ED at any time.  I warned her of the risks of leaving without further treatment.  She verbalized understanding and continued to say that she would go back to her doctor's office and that she did not want any further treatment today.   Labs Review Labs Reviewed  GLUCOSE, CAPILLARY - Abnormal; Notable for the following:    Glucose-Capillary 300 (*)    All other components within normal limits  CBC - Abnormal; Notable for the following:    WBC 11.7 (*)    RBC 3.19 (*)    Hemoglobin 10.5 (*)    HCT 30.9 (*)    All other components within normal limits  BASIC METABOLIC PANEL - Abnormal; Notable for the following:    Sodium 136 (*)    Glucose, Bld 313 (*)    BUN 24 (*)    Creatinine, Ser 1.16 (*)    GFR calc non Af Amer 46 (*)    GFR calc Af Amer 53 (*)    All other components within normal  limits  PRO B NATRIURETIC PEPTIDE - Abnormal; Notable for the following:    Pro B Natriuretic peptide (BNP) 5546.0 (*)    All other components within normal limits  D-DIMER, QUANTITATIVE - Abnormal; Notable for the following:    D-Dimer, Quant 1.28 (*)    All other components within normal limits  TROPONIN I   Imaging Review Dg Chest 2 View  01/20/2014   CLINICAL DATA:  Cough, fever, smoker  EXAM: CHEST  2 VIEW  COMPARISON:  DG CHEST 2 VIEW dated 08/15/2011  FINDINGS: There is no focal parenchymal opacity, pleural effusion, or pneumothorax. The heart and mediastinal contours are unremarkable.  There is an issue scoliosis of the thoracolumbar spine.  IMPRESSION: No active cardiopulmonary disease.   Electronically Signed  By: Kathreen Devoid   On: 01/20/2014 13:26    EKG Interpretation    Date/Time:  Wednesday January 20 2014 12:56:09 EST Ventricular Rate:  76 PR Interval:  150 QRS Duration: 68 QT Interval:  405 QTC Calculation: 455 R Axis:   68 Text Interpretation:  Sinus rhythm Probable LVH with secondary repol abnrm Anteroseptal infarct, old ST depr, consider ischemia, inferior leads No old tracing to compare Confirmed by [facility]  MD, [place] [phone]) on 01/20/2014 2:21:12 PM            MDM   1. Cough   2. Shortness of breath   3. Abnormal finding on EKG    Pt presenting with cough, shortness of breath.  Symptoms sound most c/w bronchitis, no pneumonia on CXR.  However pt is mildly tachypneic on exam and has mild ST depressions in inferior leads on EKG.  No prior EKG available for comparison.  i have reviewed chart and there is no mention of prior ekg abnormalities in chart.  BNP elevated, d-dimer elevated as well.  Troponin is negative.  CT angio obtained to r/o PE.  No signs of fluid overload on EKG.  Pt is also hypertensive and hyperglcyemic.  Also has hx of hyperlipidemia.  With these risk factors, shrotness of breath and EKG changes she will need admission for ROMI and possible  echo.  When I discussed these results and plan with patient she became upset (had already told other staff that she wanted to leave the ED, and had expressed to me earlier that she really wanted to be sure she got to go home) and refused admission.  See note above, I tried exhaustively to discuss the importance of further workup and the risks of leaving including morbidity and even mortality, but patient continued to decline admission.     Threasa Beards, MD 01/20/14 [phone]

## 2014-01-20 NOTE — ED Notes (Addendum)
Pt was approached by several staff members to try and get her to stay, including Dr. Canary Brim who explained to her the risks to her health of leaving. Pt stated that she had business to attend to and seemed upset that her doctor had sent her here. Nurse offered to call a taxi, one of the patient's friends or give the pt a bus pass to which she all refused.Pt also refused to sign stating that she was leaving AMA.  Pt was ambulatory upon departure.

## 2014-01-28 ENCOUNTER — Encounter (INDEPENDENT_AMBULATORY_CARE_PROVIDER_SITE_OTHER): Payer: Medicare Other | Admitting: Ophthalmology

## 2014-01-28 DIAGNOSIS — E1165 Type 2 diabetes mellitus with hyperglycemia: Secondary | ICD-10-CM

## 2014-01-28 DIAGNOSIS — I10 Essential (primary) hypertension: Secondary | ICD-10-CM

## 2014-01-28 DIAGNOSIS — E11311 Type 2 diabetes mellitus with unspecified diabetic retinopathy with macular edema: Secondary | ICD-10-CM

## 2014-01-28 DIAGNOSIS — E1139 Type 2 diabetes mellitus with other diabetic ophthalmic complication: Secondary | ICD-10-CM

## 2014-01-28 DIAGNOSIS — E11319 Type 2 diabetes mellitus with unspecified diabetic retinopathy without macular edema: Secondary | ICD-10-CM

## 2014-01-28 DIAGNOSIS — H35039 Hypertensive retinopathy, unspecified eye: Secondary | ICD-10-CM

## 2014-01-28 DIAGNOSIS — H43819 Vitreous degeneration, unspecified eye: Secondary | ICD-10-CM

## 2014-01-29 ENCOUNTER — Encounter (INDEPENDENT_AMBULATORY_CARE_PROVIDER_SITE_OTHER): Payer: Medicare Other | Admitting: Ophthalmology

## 2014-01-29 DIAGNOSIS — H35039 Hypertensive retinopathy, unspecified eye: Secondary | ICD-10-CM

## 2014-01-29 DIAGNOSIS — E11311 Type 2 diabetes mellitus with unspecified diabetic retinopathy with macular edema: Secondary | ICD-10-CM

## 2014-01-29 DIAGNOSIS — I10 Essential (primary) hypertension: Secondary | ICD-10-CM

## 2014-01-29 DIAGNOSIS — E1165 Type 2 diabetes mellitus with hyperglycemia: Secondary | ICD-10-CM

## 2014-01-29 DIAGNOSIS — E1139 Type 2 diabetes mellitus with other diabetic ophthalmic complication: Secondary | ICD-10-CM

## 2014-01-29 DIAGNOSIS — H43819 Vitreous degeneration, unspecified eye: Secondary | ICD-10-CM

## 2014-01-29 DIAGNOSIS — E11319 Type 2 diabetes mellitus with unspecified diabetic retinopathy without macular edema: Secondary | ICD-10-CM

## 2014-02-01 ENCOUNTER — Other Ambulatory Visit: Payer: Self-pay | Admitting: Internal Medicine

## 2014-02-01 MED ORDER — GLIPIZIDE 10 MG PO TABS: 10.0000 mg | ORAL_TABLET | Freq: Two times a day (BID) | ORAL | Status: DC

## 2014-02-24 ENCOUNTER — Encounter: Payer: Self-pay | Admitting: Internal Medicine

## 2014-02-24 ENCOUNTER — Encounter (INDEPENDENT_AMBULATORY_CARE_PROVIDER_SITE_OTHER): Payer: Medicare Other | Admitting: Ophthalmology

## 2014-02-24 ENCOUNTER — Ambulatory Visit (INDEPENDENT_AMBULATORY_CARE_PROVIDER_SITE_OTHER): Payer: Medicare Other | Admitting: Internal Medicine

## 2014-02-24 VITALS — BP 148/82 | HR 72 | Temp 98.2°F | Resp 18 | Wt 111.2 lb

## 2014-02-24 DIAGNOSIS — E119 Type 2 diabetes mellitus without complications: Secondary | ICD-10-CM

## 2014-02-24 DIAGNOSIS — Z79899 Other long term (current) drug therapy: Secondary | ICD-10-CM

## 2014-02-24 DIAGNOSIS — E559 Vitamin D deficiency, unspecified: Secondary | ICD-10-CM

## 2014-02-24 DIAGNOSIS — E1029 Type 1 diabetes mellitus with other diabetic kidney complication: Secondary | ICD-10-CM | POA: Insufficient documentation

## 2014-02-24 DIAGNOSIS — E1129 Type 2 diabetes mellitus with other diabetic kidney complication: Secondary | ICD-10-CM

## 2014-02-24 DIAGNOSIS — I10 Essential (primary) hypertension: Secondary | ICD-10-CM

## 2014-02-24 DIAGNOSIS — E782 Mixed hyperlipidemia: Secondary | ICD-10-CM

## 2014-02-24 LAB — CBC WITH DIFFERENTIAL/PLATELET
Basophils Absolute: 0.1 10*3/uL (ref 0.0–0.1)
Basophils Relative: 1 % (ref 0–1)
Eosinophils Absolute: 0.1 10*3/uL (ref 0.0–0.7)
Eosinophils Relative: 2 % (ref 0–5)
HEMATOCRIT: 32.6 % — AB (ref 36.0–46.0)
Hemoglobin: 10.8 g/dL — ABNORMAL LOW (ref 12.0–15.0)
LYMPHS ABS: 0.9 10*3/uL (ref 0.7–4.0)
LYMPHS PCT: 13 % (ref 12–46)
MCH: 32.4 pg (ref 26.0–34.0)
MCHC: 33.1 g/dL (ref 30.0–36.0)
MCV: 97.9 fL (ref 78.0–100.0)
MONO ABS: 0.3 10*3/uL (ref 0.1–1.0)
MONOS PCT: 5 % (ref 3–12)
NEUTROS PCT: 79 % — AB (ref 43–77)
Neutro Abs: 5.3 10*3/uL (ref 1.7–7.7)
Platelets: 272 10*3/uL (ref 150–400)
RBC: 3.33 MIL/uL — AB (ref 3.87–5.11)
RDW: 14.1 % (ref 11.5–15.5)
WBC: 6.7 10*3/uL (ref 4.0–10.5)

## 2014-02-24 LAB — BASIC METABOLIC PANEL WITH GFR
BUN: 23 mg/dL (ref 6–23)
CHLORIDE: 102 meq/L (ref 96–112)
CO2: 25 mEq/L (ref 19–32)
Calcium: 8.8 mg/dL (ref 8.4–10.5)
Creat: 1 mg/dL (ref 0.50–1.10)
GFR, Est African American: 65 mL/min
GFR, Est Non African American: 56 mL/min — ABNORMAL LOW
Glucose, Bld: 249 mg/dL — ABNORMAL HIGH (ref 70–99)
POTASSIUM: 4.1 meq/L (ref 3.5–5.3)
SODIUM: 138 meq/L (ref 135–145)

## 2014-02-24 LAB — LIPID PANEL
CHOL/HDL RATIO: 4.1 ratio
Cholesterol: 197 mg/dL (ref 0–200)
HDL: 48 mg/dL (ref >39)
LDL CALC: 112 mg/dL — AB (ref 0–99)
Triglycerides: 187 mg/dL — ABNORMAL HIGH (ref <150)
VLDL: 37 mg/dL (ref 0–40)

## 2014-02-24 LAB — HEPATIC FUNCTION PANEL
ALK PHOS: 69 U/L (ref 39–117)
ALT: 8 U/L (ref 0–35)
AST: 14 U/L (ref 0–37)
Albumin: 3.5 g/dL (ref 3.5–5.2)
BILIRUBIN DIRECT: 0.1 mg/dL (ref 0.0–0.3)
BILIRUBIN INDIRECT: 0.4 mg/dL (ref 0.2–1.2)
Total Bilirubin: 0.5 mg/dL (ref 0.2–1.2)
Total Protein: 6.1 g/dL (ref 6.0–8.3)

## 2014-02-24 LAB — MAGNESIUM: Magnesium: 1.1 mg/dL — ABNORMAL LOW (ref 1.5–2.5)

## 2014-02-24 LAB — TSH: TSH: 2.79 u[IU]/mL (ref 0.350–4.500)

## 2014-02-24 MED ORDER — INSULIN DETEMIR 100 UNIT/ML FLEXPEN: PEN_INJECTOR | SUBCUTANEOUS | Status: DC

## 2014-02-24 NOTE — Patient Instructions (Addendum)
Type 1 Diabetes Mellitus, Adult Type 1 diabetes mellitus, often simply referred to as diabetes, is a long-term (chronic) disease. It occurs when the islet cells in the pancreas that make insulin (a hormone) are destroyed and can no longer make insulin. Insulin is needed to move sugars from food into the tissue cells. The tissue cells use the sugars for energy. In people with type 1 diabetes, the sugars build up in the blood instead of going into the tissue cells. As a result, high blood sugar (hyperglycemia) develops. Without insulin, the body breaks down fat cells for the needed energy. This breakdown of fat cells produces acid chemicals (ketones), which increases the acid levels in the body. The effect of either high ketone or sugar (glucose) levels can be life-threatening.  Type 1 diabetes was also previously called juvenile diabetes. It most often occurs before the age of 30, but it can occur at any age. RISK FACTORS A person is predisposed to developing type 1 diabetes if someone in his or her family has the disease and is exposed to certain additional environmental triggers.  SYMPTOMS  Symptoms of type 1 diabetes may develop gradually over days to weeks or suddenly. The symptoms occur due to hyperglycemia. The symptoms can include:   Increased thirst (polydipsia).  Increased urination (polyuria).  Increased urination during the night (nocturia).  Weight loss. This weight loss may be rapid.  Frequent, recurring infections.  Tiredness (fatigue).  Weakness.  Vision changes, such as blurred vision.  Fruity smell to your breath.  Abdominal pain.  Nausea or vomiting. DIAGNOSIS  Type 1 diabetes is diagnosed when symptoms of diabetes are present and when blood glucose levels are increased. Your blood glucose level may be checked by one or more of the following blood tests:  A fasting blood glucose test. You will not be allowed to eat for at least 8 hours before a blood sample is  taken.  A random blood glucose test. Your blood glucose is checked at any time of the day regardless of when you ate.  A hemoglobin A1c blood glucose test. A hemoglobin A1c test provides information about blood glucose control over the previous 3 months. TREATMENT  Although type 1 diabetes cannot be prevented, it can be managed with insulin, diet, and exercise.  You will need to take insulin daily to keep blood glucose in the desired range.  You will need to match insulin dosing with exercise and healthy food choices. The treatment goal is to maintain the before-meal blood sugar (preprandial glucose) level at 70 130 mg/dL.  HOME CARE INSTRUCTIONS   Have your hemoglobin A1c level checked twice a year.  Perform daily blood glucose monitoring as directed by your caregiver.  Monitor urine ketones when you are ill and as directed by your caregiver.  Take your insulin as directed by your caregiver to maintain your blood glucose level in the desired range.  Never run out of insulin. It is needed every day.  Adjust insulin based on your intake of carbohydrates. Carbohydrates can raise blood glucose levels but need to be included in your diet. Carbohydrates provide vitamins, minerals, and fiber, which are an essential part of a healthy diet. Carbohydrates are found in fruits, vegetables, whole grains, dairy products, legumes, and foods containing added sugars.    Eat healthy foods. Alternate 3 meals with 3 snacks.  Maintain a healthy weight.  Carry a medical alert card or wear your medical alert jewelry.  Carry a 15 gram carbohydrate snack with you   at all times to treat low blood glucose (hypoglycemia). Some examples of 15 gram carbohydrate snacks include:  Glucose tablets, 3 or 4.   Glucose gel, 15 gram tube.  Raisins, 2 tablespoons (24 grams).  Jelly beans, 6.  Animal crackers, 8.  Fruit juice, regular soda, or low-fat milk, 4 ounces (120 mL).  Gummy treats,  9.    Recognize hypoglycemia. Hypoglycemia occurs with blood glucose levels of 70 mg/dL and below. The risk for hypoglycemia increases when fasting or skipping meals, during or after intense exercise, and during sleep. Hypoglycemia symptoms can include:  Tremors or shakes.  Decreased ability to concentrate.  Sweating.  Increased heart rate.  Headache.  Dry mouth.  Hunger.  Irritability.  Anxiety.  Restless sleep.  Altered speech or coordination.  Confusion.  Treat hypoglycemia promptly. If you are alert and able to safely swallow, follow the 15:15 rule:  Take 15 20 grams of rapid-acting glucose or carbohydrate. Rapid-acting options include glucose gel, glucose tablets, or 4 ounces (120 mL) of fruit juice, regular soda, or low-fat milk.  Check your blood glucose level 15 minutes after taking the glucose.   Take 15 20 grams more of glucose if the repeat blood glucose level is still 70 mg/dL or below.  Eat a meal or snack within 1 hour once blood glucose levels return to normal.  Be alert to polyuria and polydipsia, which are early signs of hyperglycemia. An early awareness of hyperglycemia allows for prompt treatment. Treat hyperglycemia as directed by your caregiver.  Engage in at least 150 minutes of moderate-intensity physical activity a week, spread over at least 3 days of the week or as directed by your caregiver.  Adjust your insulin dosing and food intake as needed if you start a new exercise or sport.  Follow your sick day plan at any time you are unable to eat or drink as usual.   Avoid tobacco use.  Limit alcohol intake to no more than 1 drink per day for nonpregnant women and 2 drinks per day for men. You should drink alcohol only when you are also eating food. Talk with your caregiver about whether alcohol is safe for you. Tell your caregiver if you drink alcohol several times a week.  Follow up with your caregiver regularly.  Schedule an eye exam  within 5 years of diagnosis and then annually.  Perform daily skin and foot care. Examine your skin and feet daily for cuts, bruises, redness, nail problems, bleeding, blisters, or sores. A foot exam by a caregiver should be done annually.  Brush your teeth and gums at least twice a day and floss at least once a day. Follow up with your dentist regularly.  Share your diabetes management plan with your workplace or school.  Stay up-to-date with immunizations.  Learn to manage stress.  Obtain ongoing diabetes education and support as needed.  Participate or seek rehabilitation as needed to maintain or improve independence and quality of life. Request a physical or occupational therapy referral if you are having foot or hand numbness or difficulties with grooming, dressing, eating, or physical activity. SEEK MEDICAL CARE IF:   You are unable to eat food or drink fluids for more than 6 hours.  You have nausea and vomiting for more than 6 hours.  Your blood glucose level is over 240 mg/dL.  There is a change in mental status.  You develop an additional serious illness.  You have diarrhea for more than 6 hours.  You have been   sick or have had a fever for a couple of days and are not getting better.  You have pain during any physical activity. SEEK IMMEDIATE MEDICAL CARE IF:  You have difficulty breathing.  You have moderate to large ketone levels. MAKE SURE YOU:  Understand these instructions.  Will watch your condition.  Will get help right away if you are not doing well or get worse. Document Released: 12/07/2000 Document Revised: 09/03/2012 Document Reviewed: 07/08/2012 [employer] Patient Information 2014 [employer].    Hypertension As your heart beats, it forces blood through your arteries. This force is your blood pressure. If the pressure is too high, it is called hypertension (HTN) or high blood pressure. HTN is dangerous because you may have it and not know it.  High blood pressure may mean that your heart has to work harder to pump blood. Your arteries may be narrow or stiff. The extra work puts you at risk for heart disease, stroke, and other problems.  Blood pressure consists of two numbers, a higher number over a lower, 110/72, for example. It is stated as "110 over 72." The ideal is below 120 for the top number (systolic) and under 80 for the bottom (diastolic). Write down your blood pressure today. You should pay close attention to your blood pressure if you have certain conditions such as:  Heart failure.  Prior heart attack.  Diabetes  Chronic kidney disease.  Prior stroke.  Multiple risk factors for heart disease. To see if you have HTN, your blood pressure should be measured while you are seated with your arm held at the level of the heart. It should be measured at least twice. A one-time elevated blood pressure reading (especially in the Emergency Department) does not mean that you need treatment. There may be conditions in which the blood pressure is different between your right and left arms. It is important to see your caregiver soon for a recheck. Most people have essential hypertension which means that there is not a specific cause. This type of high blood pressure may be lowered by changing lifestyle factors such as:  Stress.  Smoking.  Lack of exercise.  Excessive weight.  Drug/tobacco/alcohol use.  Eating less salt. Most people do not have symptoms from high blood pressure until it has caused damage to the body. Effective treatment can often prevent, delay or reduce that damage. TREATMENT  When a cause has been identified, treatment for high blood pressure is directed at the cause. There are a large number of medications to treat HTN. These fall into several categories, and your caregiver will help you select the medicines that are best for you. Medications may have side effects. You should review side effects with your  caregiver. If your blood pressure stays high after you have made lifestyle changes or started on medicines,   Your medication(s) may need to be changed.  Other problems may need to be addressed.  Be certain you understand your prescriptions, and know how and when to take your medicine.  Be sure to follow up with your caregiver within the time frame advised (usually within two weeks) to have your blood pressure rechecked and to review your medications.  If you are taking more than one medicine to lower your blood pressure, make sure you know how and at what times they should be taken. Taking two medicines at the same time can result in blood pressure that is too low. SEEK IMMEDIATE MEDICAL CARE IF:  You develop a severe headache,   blurred or changing vision, or confusion.  You have unusual weakness or numbness, or a faint feeling.  You have severe chest or abdominal pain, vomiting, or breathing problems. MAKE SURE YOU:   Understand these instructions.  Will watch your condition.  Will get help right away if you are not doing well or get worse.   Diabetes and Exercise Exercising regularly is important. It is not just about losing weight. It has many health benefits, such as:  Improving your overall fitness, flexibility, and endurance.  Increasing your bone density.  Helping with weight control.  Decreasing your body fat.  Increasing your muscle strength.  Reducing stress and tension.  Improving your overall health. People with diabetes who exercise gain additional benefits because exercise:  Reduces appetite.  Improves the body's use of blood sugar (glucose).  Helps lower or control blood glucose.  Decreases blood pressure.  Helps control blood lipids (such as cholesterol and triglycerides).  Improves the body's use of the hormone insulin by:  Increasing the body's insulin sensitivity.  Reducing the body's insulin needs.  Decreases the risk for heart disease  because exercising:  Lowers cholesterol and triglycerides levels.  Increases the levels of good cholesterol (such as high-density lipoproteins [HDL]) in the body.  Lowers blood glucose levels. YOUR ACTIVITY PLAN  Choose an activity that you enjoy and set realistic goals. Your health care provider or diabetes educator can help you make an activity plan that works for you. You can break activities into 2 or 3 sessions throughout the day. Doing so is as good as one long session. Exercise ideas include:  Taking the dog for a walk.  Taking the stairs instead of the elevator.  Dancing to your favorite song.  Doing your favorite exercise with a friend. RECOMMENDATIONS FOR EXERCISING WITH TYPE 1 OR TYPE 2 DIABETES   Check your blood glucose before exercising. If blood glucose levels are greater than 240 mg/dL, check for urine ketones. Do not exercise if ketones are present.  Avoid injecting insulin into areas of the body that are going to be exercised. For example, avoid injecting insulin into:  The arms when playing tennis.  The legs when jogging.  Keep a record of:  Food intake before and after you exercise.  Expected peak times of insulin action.  Blood glucose levels before and after you exercise.  The type and amount of exercise you have done.  Review your records with your health care provider. Your health care provider will help you to develop guidelines for adjusting food intake and insulin amounts before and after exercising.  If you take insulin or oral hypoglycemic agents, watch for signs and symptoms of hypoglycemia. They include:  Dizziness.  Shaking.  Sweating.  Chills.  Confusion.  Drink plenty of water while you exercise to prevent dehydration or heat stroke. Body water is lost during exercise and must be replaced.  Talk to your health care provider before starting an exercise program to make sure it is safe for you. Remember, almost any type of activity  is better than none.    Cholesterol Cholesterol is a white, waxy, fat-like protein needed by your body in small amounts. The liver makes all the cholesterol you need. It is carried from the liver by the blood through the blood vessels. Deposits (plaque) may build up on blood vessel walls. This makes the arteries narrower and stiffer. Plaque increases the risk for heart attack and stroke. You cannot feel your cholesterol level even if it is   very high. The only way to know is by a blood test to check your lipid (fats) levels. Once you know your cholesterol levels, you should keep a record of the test results. Work with your caregiver to to keep your levels in the desired range. WHAT THE RESULTS MEAN:  Total cholesterol is a rough measure of all the cholesterol in your blood.  LDL is the so-called bad cholesterol. This is the type that deposits cholesterol in the walls of the arteries. You want this level to be low.  HDL is the good cholesterol because it cleans the arteries and carries the LDL away. You want this level to be high.  Triglycerides are fat that the body can either burn for energy or store. High levels are closely linked to heart disease. DESIRED LEVELS:  Total cholesterol below 200.  LDL below 100 for people at risk, below 70 for very high risk.  HDL above 50 is good, above 60 is best.  Triglycerides below 150. HOW TO LOWER YOUR CHOLESTEROL:  Diet.  Choose fish or white meat chicken and turkey, roasted or baked. Limit fatty cuts of red meat, fried foods, and processed meats, such as sausage and lunch meat.  Eat lots of fresh fruits and vegetables. Choose whole grains, beans, pasta, potatoes and cereals.  Use only small amounts of olive, corn or canola oils. Avoid butter, mayonnaise, shortening or palm kernel oils. Avoid foods with trans-fats.  Use skim/nonfat milk and low-fat/nonfat yogurt and cheeses. Avoid whole milk, cream, ice cream, egg yolks and cheeses. Healthy  desserts include angel food cake, ginger snaps, animal crackers, hard candy, popsicles, and low-fat/nonfat frozen yogurt. Avoid pastries, cakes, pies and cookies.  Exercise.  A regular program helps decrease LDL and raises HDL.  Helps with weight control.  Do things that increase your activity level like gardening, walking, or taking the stairs.  Medication.  May be prescribed by your caregiver to help lowering cholesterol and the risk for heart disease.  You may need medicine even if your levels are normal if you have several risk factors. HOME CARE INSTRUCTIONS   Follow your diet and exercise programs as suggested by your caregiver.  Take medications as directed.  Have blood work done when your caregiver feels it is necessary. MAKE SURE YOU:   Understand these instructions.  Will watch your condition.  Will get help right away if you are not doing well or get worse.      Vitamin D Deficiency Vitamin D is an important vitamin that your body needs. Having too little of it in your body is called a deficiency. A very bad deficiency can make your bones soft and can cause a condition called rickets.  Vitamin D is important to your body for different reasons, such as:   It helps your body absorb 2 minerals called calcium and phosphorus.  It helps make your bones healthy.  It may prevent some diseases, such as diabetes and multiple sclerosis.  It helps your muscles and heart. You can get vitamin D in several ways. It is a natural part of some foods. The vitamin is also added to some dairy products and cereals. Some people take vitamin D supplements. Also, your body makes vitamin D when you are in the sun. It changes the sun's rays into a form of the vitamin that your body can use. CAUSES   Not eating enough foods that contain vitamin D.  Not getting enough sunlight.  Having certain digestive system diseases that   make it hard to absorb vitamin D. These diseases include  Crohn's disease, chronic pancreatitis, and cystic fibrosis.  Having a surgery in which part of the stomach or small intestine is removed.  Being obese. Fat cells pull vitamin D out of your blood. That means that obese people may not have enough vitamin D left in their blood and in other body tissues.  Having chronic kidney or liver disease. RISK FACTORS Risk factors are things that make you more likely to develop a vitamin D deficiency. They include:  Being older.  Not being able to get outside very much.  Living in a nursing home.  Having had broken bones.  Having weak or thin bones (osteoporosis).  Having a disease or condition that changes how your body absorbs vitamin D.  Having dark skin.  Some medicines such as seizure medicines or steroids.  Being overweight or obese. SYMPTOMS Mild cases of vitamin D deficiency may not have any symptoms. If you have a very bad case, symptoms may include:  Bone pain.  Muscle pain.  Falling often.  Broken bones caused by a minor injury, due to osteoporosis. DIAGNOSIS A blood test is the best way to tell if you have a vitamin D deficiency. TREATMENT Vitamin D deficiency can be treated in different ways. Treatment for vitamin D deficiency depends on what is causing it. Options include:  Taking vitamin D supplements.  Taking a calcium supplement. Your caregiver will suggest what dose is best for you. HOME CARE INSTRUCTIONS  Take any supplements that your caregiver prescribes. Follow the directions carefully. Take only the suggested amount.  Have your blood tested 2 months after you start taking supplements.  Eat foods that contain vitamin D. Healthy choices include:  Fortified dairy products, cereals, or juices. Fortified means vitamin D has been added to the food. Check the label on the package to be sure.  Fatty fish like salmon or trout.  Eggs.  Oysters.  Do not use a tanning bed.  Keep your weight at a healthy  level. Lose weight if you need to.  Keep all follow-up appointments. Your caregiver will need to perform blood tests to make sure your vitamin D deficiency is going away. SEEK MEDICAL CARE IF:  You have any questions about your treatment.  You continue to have symptoms of vitamin D deficiency.  You have nausea or vomiting.  You are constipated.  You feel confused.  You have severe abdominal or back pain. MAKE SURE YOU:  Understand these instructions.  Will watch your condition.  Will get help right away if you are not doing well or get worse.   

## 2014-02-24 NOTE — Progress Notes (Signed)
Patient ID: Brandy Short, female   DOB: 03-13-1941, 73 y.o.   MRN: [id number]    [name] presents for 3 month follow up with Hypertension, Hyperlipidemia, T2 NIDDM w / CKD and Vitamin D Deficiency.    HTN predates since 45. Today's BP: 148/82 mmHg . Patient denies any cardiac type chest pain, palpitations, dyspnea/orthopnea/PND, dizziness, claudication, or dependent edema.   Hyperlipidemia is controlled with diet & meds. Last lipid as below at  goal. Patient denies myalgias or other med SE's.  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 She reports glucoses are still running in the 200's. Patient denies any symptoms of reactive hypoglycemia, diabetic polys  or paresthesias. Currently she is followed by Dr Katy Fitch and Renford Dills and receiving Avastin Injections for wet MD and is followed also for glaucoma.   Further, Patient has history of Vitamin D Deficiency of 31 in 2009 with last vitamin D of 81 in Aug 2014. Patient supplements vitamin D without any suspected side-effects.    Medication List       acidophilus Caps capsule  Take 1 capsule by mouth daily.     albuterol 108 (90 BASE) MCG/ACT inhaler  Commonly known as:  PROVENTIL HFA;VENTOLIN HFA  Inhale 1 puff into the lungs 2 (two) times daily as needed for wheezing or shortness of breath.     betamethasone dipropionate 0.05 % cream  Commonly known as:  DIPROLENE  Apply 1 application topically daily. Uses for skin cancer     bisoprolol 10 MG tablet  Commonly known as:  ZEBETA  Take 5 mg by mouth daily.     brimonidine 0.15 % ophthalmic solution  Commonly known as:  ALPHAGAN  1 drop.     cholecalciferol 1000 UNITS tablet  Commonly known  as:  VITAMIN D  Take 1,000 Units by mouth daily. Takes 3 qd     clobetasol 0.05 % external solution  Commonly known as:  TEMOVATE  Apply 1 application topically 2 (two) times daily.     dorzolamide-timolol 22.3-6.8 MG/ML ophthalmic solution  Commonly known as:  COSOPT  Place 1 drop into the left eye 2 (two) times daily.     glipiZIDE 10 MG tablet  Commonly known as:  GLUCOTROL  Take 1 tablet (10 mg total) by mouth 2 (two) times daily before a meal.     hydrochlorothiazide 25 MG tablet  Commonly known as:  HYDRODIURIL  Take 25 mg by mouth daily.     Insulin Detemir 100 UNIT/ML Pen  Commonly known as:  LEVEMIR  Inject 50 units subcutaneous daily or as directed     losartan 100 MG tablet  Commonly known as:  COZAAR  Take 100 mg by mouth daily.     LUMIGAN 0.01 % Soln  Generic drug:  bimatoprost     metFORMIN 500 MG tablet  Commonly known as:  GLUCOPHAGE  Take 500 mg by mouth 4 (four) times daily.     mometasone 220 MCG/INH inhaler  Commonly known as:  ASMANEX  Inhale 2 puffs into the lungs 2 (two) times daily as needed (for congestion due to allergies).  ONE TOUCH ULTRA TEST test strip  Generic drug:  glucose blood     ONGLYZA 5 MG Tabs tablet  Generic drug:  saxagliptin HCl  Take 5 mg by mouth daily.     phenylephrine 10 MG Tabs tablet  Commonly known as:  SUDAFED PE  Take 10 mg by mouth every 4 (four) hours as needed (for nasal congestion).     ranitidine 150 MG tablet  Commonly known as:  ZANTAC  Take 1 tablet (150 mg total) by mouth at bedtime.     Red Yeast Rice 600 MG Caps  Take 600 mg by mouth daily.     vitamin B-12 100 MCG tablet  Commonly known as:  CYANOCOBALAMIN  Take 50 mcg by mouth daily. Takes 3 qd         Allergies  Allergen Reactions  . Actonel [Risedronate Sodium]     Dyspepsia  . Hyosol-Sl [Hyoscyamine] Other (See Comments)    Dry mouth  . Metformin And Related Diarrhea  . Phenobarbital Diarrhea  . Ace Inhibitors Rash  .  Dilaudid [Hydromorphone Hcl] Rash  . Quinine Derivatives Rash  . Sulfur Rash  . Tetracyclines & Related Rash    Skin peeled off     PMHx:   Past Medical History  Diagnosis Date  . Depression   . Diverticulosis   . Adenomatous colon polyp   . Asthma   . Hypertension   . Mixed hyperlipidemia   . Type II or unspecified type diabetes mellitus without mention of complication, not stated as uncontrolled   . Anemia   . GERD (gastroesophageal reflux disease)   . Vitamin D deficiency   . Glaucoma   . Diverticulitis     FHx:    Reviewed / unchanged  SHx:    Reviewed / unchanged  Systems Review: Constitutional: Denies fever, chills, wt changes, headaches, insomnia, fatigue, night sweats, change in appetite. Eyes: Denies redness, blurred vision, diplopia, discharge, itchy, watery eyes.  ENT: Denies discharge, congestion, post nasal drip, epistaxis, sore throat, earache, hearing loss, dental pain, tinnitus, vertigo, sinus pain, snoring.  CV: Denies chest pain, palpitations, irregular heartbeat, syncope, dyspnea, diaphoresis, orthopnea, PND, claudication, edema. Respiratory: denies cough, dyspnea, DOE, pleurisy, hoarseness, laryngitis, wheezing.  Gastrointestinal: Denies dysphagia, odynophagia, heartburn, reflux, water brash, abdominal pain or cramps, nausea, vomiting, bloating, diarrhea, constipation, hematemesis, melena, hematochezia,  or hemorrhoids. Genitourinary: Denies dysuria, frequency, urgency, nocturia, hesitancy, discharge, hematuria, flank pain. Musculoskeletal: Denies arthralgias, myalgias, stiffness, jt. swelling, pain, limp, strain/sprain.  Skin: Denies pruritus, rash, hives, warts, acne, eczema, change in skin lesion(s). Neuro: No weakness, tremor, incoordination, spasms, paresthesia, or pain. Psychiatric: Denies confusion, memory loss, or sensory loss. Endo: Denies change in weight, skin, hair change.  Heme/Lymph: No excessive bleeding, bruising, orenlarged lymph  nodes.  BP: 148/82  Pulse: 72  Temp: 98.2 F (36.8 C)  Resp: 18    Estimated body mass index is 18.5 kg/(m^2) as calculated from the following:   Height as of 01/07/14: 5\' 5"  (1.651 m).   Weight as of this encounter: 111 lb 3.2 oz (50.44 kg).  On Exam: Appears well nourished - in no distress. Eyes: PERRLA, EOMs, conjunctiva no swelling or erythema. Sinuses: No frontal/maxillary tenderness ENT/Mouth: EAC's clear, TM's nl w/o erythema, bulging. Nares clear w/o erythema, swelling, exudates. Oropharynx clear without erythema or exudates. Oral hygiene is good. Tongue normal, non obstructing. Hearing intact.  Neck: Supple. Thyroid nl. Car 2+/2+ without bruits, nodes or JVD. Chest: Respirations nl with BS clear & equal  w/o rales, rhonchi, wheezing or stridor.  Cor: Heart sounds normal w/ regular rate and rhythm without sig. murmurs, gallops, clicks, or rubs. Peripheral pulses normal and equal  without edema.  Abdomen: Soft & bowel sounds normal. Non-tender w/o guarding, rebound, hernias, masses, or organomegaly.  Lymphatics: Unremarkable.  Musculoskeletal: Full ROM all peripheral extremities, joint stability, 5/5 strength, and normal gait.  Skin: Warm, dry without exposed rashes, lesions, ecchymosis apparent.  Neuro: Cranial nerves intact, reflexes equal bilaterally. Sensory-motor testing grossly intact. Tendon reflexes grossly intact.  Pysch: Alert & oriented x 3. Insight and judgement nl & appropriate. No ideations.  Assessment and Plan:  1. Hypertension - Continue monitor blood pressure at home. Continue diet/meds same.  2. Hyperlipidemia - Continue diet/meds, exercise,& lifestyle modifications. Continue monitor periodic cholesterol/liver & renal functions   3. 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  4. Vitamin D Deficiency - Continue supplementation.  5. Glaucoma  and Wet Macular Degeneration  Recommended regular exercise, BP monitoring, weight control, and discussed med and SE's. Recommended labs to assess and monitor clinical status. Further disposition pending results of labs. Rx sent to her mail order pharmacy for [place] and she will return for nurse teaching injection techniques. Patient wa sadvised wit start of insulin to stop glipizide and hopefully when glucoses better controlled to taper and d/c the Onglyza.

## 2014-02-25 LAB — HEMOGLOBIN A1C
HEMOGLOBIN A1C: 10.3 % — AB (ref <5.7)
Mean Plasma Glucose: 249 mg/dL — ABNORMAL HIGH (ref <117)

## 2014-02-25 LAB — INSULIN, FASTING: Insulin fasting, serum: 6 u[IU]/mL (ref 3–28)

## 2014-02-25 LAB — VITAMIN D 25 HYDROXY (VIT D DEFICIENCY, FRACTURES): Vit D, 25-Hydroxy: 61 ng/mL (ref 30–89)

## 2014-03-03 ENCOUNTER — Encounter (INDEPENDENT_AMBULATORY_CARE_PROVIDER_SITE_OTHER): Payer: Medicare Other | Admitting: Ophthalmology

## 2014-03-03 DIAGNOSIS — H35039 Hypertensive retinopathy, unspecified eye: Secondary | ICD-10-CM

## 2014-03-03 DIAGNOSIS — I10 Essential (primary) hypertension: Secondary | ICD-10-CM

## 2014-03-03 DIAGNOSIS — E1139 Type 2 diabetes mellitus with other diabetic ophthalmic complication: Secondary | ICD-10-CM

## 2014-03-03 DIAGNOSIS — E11319 Type 2 diabetes mellitus with unspecified diabetic retinopathy without macular edema: Secondary | ICD-10-CM

## 2014-03-03 DIAGNOSIS — H43819 Vitreous degeneration, unspecified eye: Secondary | ICD-10-CM

## 2014-03-03 DIAGNOSIS — E1165 Type 2 diabetes mellitus with hyperglycemia: Secondary | ICD-10-CM

## 2014-03-03 DIAGNOSIS — E11311 Type 2 diabetes mellitus with unspecified diabetic retinopathy with macular edema: Secondary | ICD-10-CM

## 2014-03-09 ENCOUNTER — Ambulatory Visit (INDEPENDENT_AMBULATORY_CARE_PROVIDER_SITE_OTHER): Payer: Medicare Other | Admitting: Physician Assistant

## 2014-03-09 ENCOUNTER — Ambulatory Visit: Payer: Self-pay

## 2014-03-09 ENCOUNTER — Encounter: Payer: Self-pay | Admitting: Physician Assistant

## 2014-03-09 VITALS — BP 110/62 | HR 68 | Temp 98.2°F | Resp 16 | Ht 65.0 in | Wt 110.0 lb

## 2014-03-09 DIAGNOSIS — E1129 Type 2 diabetes mellitus with other diabetic kidney complication: Secondary | ICD-10-CM

## 2014-03-09 NOTE — Progress Notes (Signed)
Diabetes Education and Follow-Up Visit  73 y.o.female presents for diabetic education and insulin instruction. We have been unable to control her sugars with oral agents so she is starting on Levemir.  She complains of visual disturbances. The patient is checking her sugars at home.   Home BG Monitoring:  Checking 1 times a day. Average:  250  High: 400        Low:  150  Low fat/carbohydrate diet?  No Nicotine Abuse?  No Medication Compliance?  Yes Exercise?  No Alcohol Abuse?  No  ROS: no polyuria or polydipsia, no chest pain, dyspnea or TIA's, no numbness, tingling or pain in extremities  Physical Exam: Blood pressure 110/62, pulse 68, temperature 98.2 F (36.8 C), resp. rate 16, height 5\' 5"  (1.651 m), weight 110 lb (49.896 kg). Body mass index is 18.31 kg/(m^2). General Appearance:  alert, oriented, no acute distress and cachectic heart sounds regular rate and rhythm, S1, S2 normal, no murmur, click, rub or gallop, chest clear, no hepatosplenomegaly, no carotid bruits, feet: normal DP and PT pulses, no trophic changes or ulcerative lesions and reduced sensation at bottom of feet.  Labs: Lab Results  Component Value Date   HGBA1C 10.3* 02/24/2014    No results found for this basenameDerl Barrow    Lab Results  Component Value Date   CHOL 197 02/24/2014   HDL 48 02/24/2014   LDLCALC 112* 02/24/2014   TRIG 187* 02/24/2014   CHOLHDL 4.1 02/24/2014     Assessment: 1.  Diabetes type II  2. BP is at goal. 3. Cholesterol is not at goal.   Plan: Discussed general issues about diabetes pathophysiology and management. Counseling at today's visit: reminded to check sugars regularly and to bring readings in at the time of the next visit and discussed DASH diet. Neurosurgeon distributed. Addressed ADA diet. Suggested low cholesterol diet. Encouraged aerobic exercise. Discussed foot care. Reminded to get yearly retinal exam. Discussed ways to avoid symptomatic  hypoglycemia. Added insulin: Levemir Flex touch start at 15 units daily. Reminded to bring in blood sugar diary at next visit. Follow up in 3 weeks or as needed.  Recommendations: 1.  Patient is counseled on appropriate foot care. 2.  BP goal < 130/80. 3.  LDL goal of < 100, HDL > 40 and TG < 150. 4.  Eye Exam yearly and Dental Exam every 6 months. 5.  Dietary recommendations 6.  Physical Activity recommendations   OVER 30 minutes of exam, counseling, chart review, and insulin teaching

## 2014-03-09 NOTE — Patient Instructions (Addendum)
Please STOP the glipizide and start the insulin Do 15 units daily for 3 days if you your sugar is above 180 please increase to 20 units after the 3 days.   Hypoglycemia (Low Blood Sugar) Hypoglycemia is when the glucose (sugar) in your blood is too low. Hypoglycemia can happen for many reasons. It can happen to people with or without diabetes. Hypoglycemia can develop quickly and can be a medical emergency.  CAUSES  Having hypoglycemia does not mean that you will develop diabetes. Different causes include:  Missed or delayed meals or not enough carbohydrates eaten.  Medication overdose. This could be by accident or deliberate. If by accident, your medication may need to be adjusted or changed.  Exercise or increased activity without adjustments in carbohydrates or medications.  A nerve disorder that affects body functions like your heart rate, blood pressure and digestion (autonomic neuropathy).  A condition where the stomach muscles do not function properly (gastroparesis). Therefore, medications may not absorb properly.  The inability to recognize the signs of hypoglycemia (hypoglycemic unawareness).  Absorption of insulin  may be altered.  Alcohol consumption.  Pregnancy/menstrual cycles/postpartum. This may be due to hormones.  Certain kinds of tumors. This is very rare. SYMPTOMS   Sweating.  Hunger.  Dizziness.  Blurred vision.  Drowsiness.  Weakness.  Headache.  Rapid heart beat.  Shakiness.  Nervousness. DIAGNOSIS  Diagnosis is made by monitoring blood glucose in one or all of the following ways:  Fingerstick blood glucose monitoring.  Laboratory results. TREATMENT  If you think your blood glucose is low:  Check your blood glucose, if possible. If it is less than 70 mg/dl, take one of the following:  3-4 glucose tablets.   cup juice (prefer clear like apple).   cup "regular" soda pop.  1 cup milk.  -1 tube of glucose gel.  5-6 hard  candies.  Do not over treat because your blood glucose (sugar) will only go too high.  Wait 15 minutes and recheck your blood glucose. If it is still less than 70 mg/dl (or below your target range), repeat treatment.  Eat a snack if it is more than one hour until your next meal. Sometimes, your blood glucose may go so low that you are unable to treat yourself. You may need someone to help you. You may even pass out or be unable to swallow. This may require you to get an injection of glucagon, which raises the blood glucose. HOME CARE INSTRUCTIONS  Check blood glucose as recommended by your caregiver.  Take medication as prescribed by your caregiver.  Follow your meal plan. Do not skip meals. Eat on time.  If you are going to drink alcohol, drink it only with meals.  Check your blood glucose before driving.  Check your blood glucose before and after exercise. If you exercise longer or different than usual, be sure to check blood glucose more frequently.  Always carry treatment with you. Glucose tablets are the easiest to carry.  Always wear medical alert jewelry or carry some form of identification that states that you have diabetes. This will alert people that you have diabetes. If you have hypoglycemia, they will have a better idea on what to do. SEEK MEDICAL CARE IF:   You are having problems keeping your blood sugar at target range.  You are having frequent episodes of hypoglycemia.  You feel you might be having side effects from your medicines.  You have symptoms of an illness that is not improving  after 3-4 days.  You notice a change in vision or a new problem with your vision. SEEK IMMEDIATE MEDICAL CARE IF:   You are a family member or friend of a person whose blood glucose goes below 70 mg/dl and is accompanied by:  Confusion.  A change in mental status.  The inability to swallow.  Passing out. Document Released: 12/10/2005 Document Revised: 03/03/2012 Document  Reviewed: 04/07/2012 [facility] Patient Information 2014 [place], [place]. How and Where to Give Insulin Injections, Adult USING INSULIN PENS 1. Wash your hands with soap and water. 2. If you are using the "cloudy" insulin, roll the pen between your palms several times or rotate the pen top to bottom several times. 3. Remove the insulin pen cap. 4. Clean the rubber stopper of the cartridge with an alcohol wipe. 5. Remove the protective paper tab from the disposable needle. 6. Screw the needle onto the pen. 7. Remove the outer plastic needle cover. 8. Remove the inner plastic needle cover. 9. Prime the insulin pen by turning the button (dial) to 2 units. Hold the pen with the needle pointing up, and push the dial on the opposite end until a drop of insulin appears at the needle tip. If no insulin appears, repeat this step. 10. Dial the number of units of insulin you will inject. 11. Use an alcohol wipe to clean the area of the body to be injected. 12. Pinch up 1 inch of skin and hold it. 13. Put the needle straight into the skin (90-degree angle). 14. Push the dial down to push the insulin into the fat tissue. 15. Count to 10 slowly. Then, remove the needle from the fat tissue. 16. Carefully replace the larger outer plastic needle cover over the needle and unscrew the capped needle. THROWING AWAY SUPPLIES  Discard used needles in a puncture proof sharps disposal container. Follow disposal regulations for the area where you live.You can put the needles in a milk jug.   Vials and empty disposable pens may be thrown away in the regular trash. Document Released: 03/01/2004 Document Revised: 03/03/2012 Document Reviewed: 05/19/2013 [facility] Patient Information 2014 [place], [place].

## 2014-03-15 ENCOUNTER — Other Ambulatory Visit: Payer: Self-pay | Admitting: Emergency Medicine

## 2014-03-16 ENCOUNTER — Telehealth: Payer: Self-pay

## 2014-03-16 NOTE — Telephone Encounter (Signed)
Received call from patient concerning her a.m. Blood sugars, she states that her blood sugar is 70 in the mornings using 15 units Levemir flexpen, per Vicie Mutters, PA advised patient to decrease from 15 units to 10 units and advise if still falling below 100, patient verbalized understanding

## 2014-03-24 ENCOUNTER — Ambulatory Visit: Payer: Self-pay | Admitting: Internal Medicine

## 2014-03-31 ENCOUNTER — Telehealth: Payer: Self-pay

## 2014-03-31 NOTE — Telephone Encounter (Signed)
[facility] from [facility] called requesting last OV note, labs and EKG. Records were faxed to [phone]

## 2014-04-02 ENCOUNTER — Ambulatory Visit (INDEPENDENT_AMBULATORY_CARE_PROVIDER_SITE_OTHER): Payer: Medicare Other | Admitting: Internal Medicine

## 2014-04-02 VITALS — BP 148/78 | HR 72 | Temp 98.1°F | Resp 16 | Ht 65.0 in | Wt 111.8 lb

## 2014-04-02 DIAGNOSIS — E1029 Type 1 diabetes mellitus with other diabetic kidney complication: Secondary | ICD-10-CM

## 2014-04-02 NOTE — Patient Instructions (Signed)
If Blood Sugars are consistently high  - running over 200 mg%  Then   you may increase Levemir -   adding 2 more units every 3 to 5 days  Type 1 Diabetes Mellitus, Adult Type 1 diabetes mellitus, often simply referred to as diabetes, is a long-term (chronic) disease. It occurs when the islet cells in the pancreas that make insulin (a hormone) are destroyed and can no longer make insulin. Insulin is needed to move sugars from food into the tissue cells. The tissue cells use the sugars for energy. In people with type 1 diabetes, the sugars build up in the blood instead of going into the tissue cells. As a result, high blood sugar (hyperglycemia) develops. Without insulin, the body breaks down fat cells for the needed energy. This breakdown of fat cells produces acid chemicals (ketones), which increases the acid levels in the body. The effect of either high ketone or sugar (glucose) levels can be life-threatening.  Type 1 diabetes was also previously called juvenile diabetes. It most often occurs before the age of 11, but it can occur at any age. RISK FACTORS A person is predisposed to developing type 1 diabetes if someone in his or her family has the disease and is exposed to certain additional environmental triggers.  SYMPTOMS  Symptoms of type 1 diabetes may develop gradually over days to weeks or suddenly. The symptoms occur due to hyperglycemia. The symptoms can include:   Increased thirst (polydipsia).  Increased urination (polyuria).  Increased urination during the night (nocturia).  Weight loss. This weight loss may be rapid.  Frequent, recurring infections.  Tiredness (fatigue).  Weakness.  Vision changes, such as blurred vision.  Fruity smell to your breath.  Abdominal pain.  Nausea or vomiting. DIAGNOSIS  Type 1 diabetes is diagnosed when symptoms of diabetes are present and when blood glucose levels are increased. Your blood glucose level may be checked by one or more  of the following blood tests:  A fasting blood glucose test. You will not be allowed to eat for at least 8 hours before a blood sample is taken.  A random blood glucose test. Your blood glucose is checked at any time of the day regardless of when you ate.  A hemoglobin A1c blood glucose test. A hemoglobin A1c test provides information about blood glucose control over the previous 3 months. TREATMENT  Although type 1 diabetes cannot be prevented, it can be managed with insulin, diet, and exercise.  You will need to take insulin daily to keep blood glucose in the desired range.  You will need to match insulin dosing with exercise and healthy food choices. The treatment goal is to maintain the before-meal blood sugar (preprandial glucose) level at 70 130 mg/dL.  HOME CARE INSTRUCTIONS   Have your hemoglobin A1c level checked twice a year.  Perform daily blood glucose monitoring as directed by your caregiver.  Monitor urine ketones when you are ill and as directed by your caregiver.  Take your insulin as directed by your caregiver to maintain your blood glucose level in the desired range.  Never run out of insulin. It is needed every day.  Adjust insulin based on your intake of carbohydrates. Carbohydrates can raise blood glucose levels but need to be included in your diet. Carbohydrates provide vitamins, minerals, and fiber, which are an essential part of a healthy diet. Carbohydrates are found in fruits, vegetables, whole grains, dairy products, legumes, and foods containing added sugars.    Eat healthy  foods. Alternate 3 meals with 3 snacks.  Maintain a healthy weight.  Carry a medical alert card or wear your medical alert jewelry.  Carry a 15 gram carbohydrate snack with you at all times to treat low blood glucose (hypoglycemia). Some examples of 15 gram carbohydrate snacks include:  Glucose tablets, 3 or 4.   Glucose gel, 15 gram tube.  Raisins, 2 tablespoons (24  grams).  Jelly beans, 6.  Animal crackers, 8.  Fruit juice, regular soda, or low-fat milk, 4 ounces (120 mL).  Gummy treats, 9.    Recognize hypoglycemia. Hypoglycemia occurs with blood glucose levels of 70 mg/dL and below. The risk for hypoglycemia increases when fasting or skipping meals, during or after intense exercise, and during sleep. Hypoglycemia symptoms can include:  Tremors or shakes.  Decreased ability to concentrate.  Sweating.  Increased heart rate.  Headache.  Dry mouth.  Hunger.  Irritability.  Anxiety.  Restless sleep.  Altered speech or coordination.  Confusion.  Treat hypoglycemia promptly. If you are alert and able to safely swallow, follow the 15:15 rule:  Take 15 20 grams of rapid-acting glucose or carbohydrate. Rapid-acting options include glucose gel, glucose tablets, or 4 ounces (120 mL) of fruit juice, regular soda, or low-fat milk.  Check your blood glucose level 15 minutes after taking the glucose.   Take 15 20 grams more of glucose if the repeat blood glucose level is still 70 mg/dL or below.  Eat a meal or snack within 1 hour once blood glucose levels return to normal.  Be alert to polyuria and polydipsia, which are early signs of hyperglycemia. An early awareness of hyperglycemia allows for prompt treatment. Treat hyperglycemia as directed by your caregiver.  Engage in at least 150 minutes of moderate-intensity physical activity a week, spread over at least 3 days of the week or as directed by your caregiver.  Adjust your insulin dosing and food intake as needed if you start a new exercise or sport.  Follow your sick day plan at any time you are unable to eat or drink as usual.   Avoid tobacco use.  Limit alcohol intake to no more than 1 drink per day for nonpregnant women and 2 drinks per day for men. You should drink alcohol only when you are also eating food. Talk with your caregiver about whether alcohol is safe for you.  Tell your caregiver if you drink alcohol several times a week.  Follow up with your caregiver regularly.  Schedule an eye exam within 5 years of diagnosis and then annually.  Perform daily skin and foot care. Examine your skin and feet daily for cuts, bruises, redness, nail problems, bleeding, blisters, or sores. A foot exam by a caregiver should be done annually.  Brush your teeth and gums at least twice a day and floss at least once a day. Follow up with your dentist regularly.  Share your diabetes management plan with your workplace or school.  Stay up-to-date with immunizations.  Learn to manage stress.  Obtain ongoing diabetes education and support as needed.  Participate or seek rehabilitation as needed to maintain or improve independence and quality of life. Request a physical or occupational therapy referral if you are having foot or hand numbness or difficulties with grooming, dressing, eating, or physical activity. SEEK MEDICAL CARE IF:   You are unable to eat food or drink fluids for more than 6 hours.  You have nausea and vomiting for more than 6 hours.  Your blood glucose  level is over 240 mg/dL.  There is a change in mental status.  You develop an additional serious illness.  You have diarrhea for more than 6 hours.  You have been sick or have had a fever for a couple of days and are not getting better.  You have pain during any physical activity. SEEK IMMEDIATE MEDICAL CARE IF:  You have difficulty breathing.  You have moderate to large ketone levels. MAKE SURE YOU:  Understand these instructions.  Will watch your condition.  Will get help right away if you are not doing well or get worse.

## 2014-04-04 NOTE — Progress Notes (Signed)
Subjective:    Patient ID: Brandy Short, female    DOB: 30-Aug-1941, 73 y.o.   MRN: [id number]  HPI Patient is a  73 YO WWF T2 DM who is seen in 3 week F/U after starting on Levemir for better control of Fasting and random CBG's , who now is also off of Glipizide and Onglyza(which incidentally she was unable to afford). She denies any diabetic poly's, paresthesias or hypoglycemic reactions with usual range of glucoses betw 150-250 mg%.   Medication Sig  . acidophilus (RISAQUAD) CAPS Take 1 capsule by mouth daily.   Marland Kitchen albuterol (PROVENTIL HFA;VENTOLIN HFA Inhale 1 puff into the lungs 2 (two) times daily as needed  . betamethasone dipropionate ( 0.05 % cream Apply 1 application topically daily. Uses for skin cancer  . bisoprolol (ZEBETA) 10 MG tablet Take 5 mg by mouth daily.   . brimonidine (ALPHAGAN) 0.15 % ophthalmic solution 1 drop.   . clobetasol (TEMOVATE) 0.05 % external solution Apply 1 application topically 2 (two) times daily.   . dorzolamide-timolol (COSOPT) ophthalmic  Place 1 drop into the left eye 2 (two) times daily.   . hydrochlorothiazide (HYDRODIURIL) 25 MG tablet Take 25 mg by mouth daily.  . Insulin Detemir (LEVEMIR) 100 UNIT/ML Pen Inject 50 units subcutaneous daily or as directed  . losartan (COZAAR) 100 MG tablet Take 100 mg by mouth daily.   Marland Kitchen LUMIGAN 0.01 % SOLN   . metFORMIN (GLUCOPHAGE) 500 MG tablet Take 500 mg by mouth 4 (four) times daily.  . mometasone (ASMANEX) 220 MCG/INH inhaler Inhale 2 puffs into the lungs 2 (two) times daily as needed (for congestion due to allergies).  . ONE TOUCH ULTRA TEST test strip CHECK BLOOD SUGAR EVERY DAY TO 3 TIMES A DAY  . phenylephrine (SUDAFED PE) 10 MG TABS Take 10 mg by mouth every 4 (four) hours as needed   . ranitidine (ZANTAC) 150 MG tablet Take 1 tablet (150 mg total) by mouth at bedtime.  . Red Yeast Rice 600 MG CAPS Take 600 mg by mouth daily.    . vitamin B-12 (CYANOCOBALAMIN) 100 MCG tablet Take 50 mcg by mouth  daily. Takes 3 qd   Allergies  Allergen Reactions  . Actonel [Risedronate Sodium]     Dyspepsia  . Hyosol-Sl [Hyoscyamine] Other (See Comments)    Dry mouth  . Metformin And Related Diarrhea  . Phenobarbital Diarrhea  . Ace Inhibitors Rash  . Dilaudid [Hydromorphone Hcl] Rash  . Quinine Derivatives Rash  . Sulfur Rash  . Tetracyclines & Related Rash    Skin peeled off    Past Medical History  Diagnosis Date  . Depression   . Diverticulosis   . Adenomatous colon polyp   . Asthma   . Hypertension   . Mixed hyperlipidemia   . Type II or unspecified type diabetes mellitus without mention of complication, not stated as uncontrolled   . Anemia   . GERD (gastroesophageal reflux disease)   . Vitamin D deficiency   . Glaucoma   . Diverticulitis    Review of Systems  Non contributory      Objective:   Physical Exam    BP 148/78  Pulse 72  Temp 98.1 F   Resp 16  Ht 5\' 5"    Wt 111 lb 12.8 oz   BMI 18.60 kg/m2  HEENT - Eac's patent. TM's Nl.EOM's full. PERRLA. NasoOroPharynx clear. Neck - supple. Nl Thyroid. No bruits nodes JVD Chest - Clear equal BS Cor -  Nl HS. RRR w/o sig MGR. PP 1(+) No edema. Abd - No palpable organomegaly, masses or tenderness. BS nl. MS- FROM. w/o deformities. Muscle power tone and bulk Nl. Gait Nl. Neuro - No obvious Cr N abnormalities. Sensory, motor and Cerebellar functions appear Nl w/o focal abnormalities.  Assessment & Plan:   1 -  HTN  2 - T2 DM - being transitioned to insulin for better control  - patient was re-educated in Insulin dynamics - effects and SE's and advised to increase daily basal dose 22-3 units every 3-5 days if glucoses continued to range over 180 mg% and to call if questions re: dosing. Also discussed appropriate food types, etc.

## 2014-04-05 ENCOUNTER — Encounter: Payer: Self-pay | Admitting: Internal Medicine

## 2014-04-07 ENCOUNTER — Encounter (INDEPENDENT_AMBULATORY_CARE_PROVIDER_SITE_OTHER): Payer: Medicare Other | Admitting: Ophthalmology

## 2014-04-27 ENCOUNTER — Encounter: Payer: Self-pay | Admitting: Internal Medicine

## 2014-04-27 ENCOUNTER — Ambulatory Visit (INDEPENDENT_AMBULATORY_CARE_PROVIDER_SITE_OTHER): Payer: Medicare Other | Admitting: Internal Medicine

## 2014-04-27 VITALS — BP 144/66 | HR 64 | Temp 97.4°F | Resp 18 | Ht 65.0 in | Wt 112.4 lb

## 2014-04-27 DIAGNOSIS — E1029 Type 1 diabetes mellitus with other diabetic kidney complication: Secondary | ICD-10-CM

## 2014-04-27 DIAGNOSIS — I10 Essential (primary) hypertension: Secondary | ICD-10-CM

## 2014-04-27 NOTE — Progress Notes (Signed)
   Subjective:    Patient ID: Brandy Short, female    DOB: Sep 19, 1941, 73 y.o.   MRN: [id number]  HPI Patient presents for 3 week F/U after adjusting Levemir dosing. Bid CBG's  have ranged betw 200-240 mg% on Levemir of 15-17 units/daily.  She also reports recent Lt. eye surgery by Dr Katy Fitch for glaucoma. Other current issues include insomnia.  Meds, Allergies, PMH, FH & SH reviewed  Review of Systems  In addition to the HPI above,  No Fever-chills,  No Headache, and vision improved in Lt eye,  No problems swallowing food or Liquids,  No Chest pain or productive Cough or Shortness of Breath,  No Abdominal pain, No Nausea or Vommitting, Bowel movements are regular,  No Blood in stool or Urine,  No dysuria,  No new skin rashes or bruises,  No new joints pains-aches,  No new weakness, tingling, numbness in any extremity,  No recent weight loss,  No polyuria, polydypsia or polyphagia,  No significant Mental Stressors.  A full 10 point Review of Systems was done, except as stated above, all other Review of Systems were negative  Objective:   Physical Exam  BP 144/66  Pulse 64  Temp 97.4 F   Resp 18  Ht 5\' 5"    Wt 112 lb 6.4 oz   BMI 18.70 kg/m2  HEENT - Eac's patent. TM's Nl.EOM's full. PERRLA. NasoOroPharynx clear. Neck - supple. Nl Thyroid. No bruits nodes JVD Chest - Clear equal BS Cor - Nl HS. RRR w/o sig MGR. PP 1(+) No edema. Abd - No palpable organomegaly, masses or tenderness. BS nl. MS- FROM. w/o deformities. Muscle power tone and bulk Nl. Gait Nl. Neuro - No obvious Cr N abnormalities. Sensory, motor and Cerebellar functions appear Nl w/o focal abnormalities.  Assessment & Plan:   1. 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  2. Hypertension

## 2014-04-27 NOTE — Patient Instructions (Signed)
Increase Levemir to 20 units  / daily as discussed  ++++++++++++++++++++++++++++++++++++++++++++++++++  Type 1 Diabetes Mellitus, Adult Type 1 diabetes mellitus, often simply referred to as diabetes, is a long-term (chronic) disease. It occurs when the islet cells in the pancreas that make insulin (a hormone) are destroyed and can no longer make insulin. Insulin is needed to move sugars from food into the tissue cells. The tissue cells use the sugars for energy. In people with type 1 diabetes, the sugars build up in the blood instead of going into the tissue cells. As a result, high blood sugar (hyperglycemia) develops. Without insulin, the body breaks down fat cells for the needed energy. This breakdown of fat cells produces acid chemicals (ketones), which increases the acid levels in the body. The effect of either high ketone or sugar (glucose) levels can be life-threatening.  Type 1 diabetes was also previously called juvenile diabetes. It most often occurs before the age of 25, but it can occur at any age. RISK FACTORS A person is predisposed to developing type 1 diabetes if someone in his or her family has the disease and is exposed to certain additional environmental triggers.  SYMPTOMS  Symptoms of type 1 diabetes may develop gradually over days to weeks or suddenly. The symptoms occur due to hyperglycemia. The symptoms can include:   Increased thirst (polydipsia).  Increased urination (polyuria).  Increased urination during the night (nocturia).  Weight loss. This weight loss may be rapid.  Frequent, recurring infections.  Tiredness (fatigue).  Weakness.  Vision changes, such as blurred vision.  Fruity smell to your breath.  Abdominal pain.  Nausea or vomiting. DIAGNOSIS  Type 1 diabetes is diagnosed when symptoms of diabetes are present and when blood glucose levels are increased. Your blood glucose level may be checked by one or more of the following blood tests:  A  fasting blood glucose test. You will not be allowed to eat for at least 8 hours before a blood sample is taken.  A random blood glucose test. Your blood glucose is checked at any time of the day regardless of when you ate.  A hemoglobin A1c blood glucose test. A hemoglobin A1c test provides information about blood glucose control over the previous 3 months. TREATMENT  Although type 1 diabetes cannot be prevented, it can be managed with insulin, diet, and exercise.  You will need to take insulin daily to keep blood glucose in the desired range.  You will need to match insulin dosing with exercise and healthy food choices. The treatment goal is to maintain the before-meal blood sugar (preprandial glucose) level at 70 130 mg/dL.  HOME CARE INSTRUCTIONS   Have your hemoglobin A1c level checked twice a year.  Perform daily blood glucose monitoring as directed by your caregiver.  Monitor urine ketones when you are ill and as directed by your caregiver.  Take your insulin as directed by your caregiver to maintain your blood glucose level in the desired range.  Never run out of insulin. It is needed every day.  Adjust insulin based on your intake of carbohydrates. Carbohydrates can raise blood glucose levels but need to be included in your diet. Carbohydrates provide vitamins, minerals, and fiber, which are an essential part of a healthy diet. Carbohydrates are found in fruits, vegetables, whole grains, dairy products, legumes, and foods containing added sugars.    Eat healthy foods. Alternate 3 meals with 3 snacks.  Maintain a healthy weight.  Carry a medical alert card  or wear your medical alert jewelry.  Carry a 15 gram carbohydrate snack with you at all times to treat low blood glucose (hypoglycemia). Some examples of 15 gram carbohydrate snacks include:  Glucose tablets, 3 or 4.   Glucose gel, 15 gram tube.  Raisins, 2 tablespoons (24 grams).  Jelly beans, 6.  Animal  crackers, 8.  Fruit juice, regular soda, or low-fat milk, 4 ounces (120 mL).  Gummy treats, 9.    Recognize hypoglycemia. Hypoglycemia occurs with blood glucose levels of 70 mg/dL and below. The risk for hypoglycemia increases when fasting or skipping meals, during or after intense exercise, and during sleep. Hypoglycemia symptoms can include:  Tremors or shakes.  Decreased ability to concentrate.  Sweating.  Increased heart rate.  Headache.  Dry mouth.  Hunger.  Irritability.  Anxiety.  Restless sleep.  Altered speech or coordination.  Confusion.  Treat hypoglycemia promptly. If you are alert and able to safely swallow, follow the 15:15 rule:  Take 15 20 grams of rapid-acting glucose or carbohydrate. Rapid-acting options include glucose gel, glucose tablets, or 4 ounces (120 mL) of fruit juice, regular soda, or low-fat milk.  Check your blood glucose level 15 minutes after taking the glucose.   Take 15 20 grams more of glucose if the repeat blood glucose level is still 70 mg/dL or below.  Eat a meal or snack within 1 hour once blood glucose levels return to normal.  Be alert to polyuria and polydipsia, which are early signs of hyperglycemia. An early awareness of hyperglycemia allows for prompt treatment. Treat hyperglycemia as directed by your caregiver.  Engage in at least 150 minutes of moderate-intensity physical activity a week, spread over at least 3 days of the week or as directed by your caregiver.  Adjust your insulin dosing and food intake as needed if you start a new exercise or sport.  Follow your sick day plan at any time you are unable to eat or drink as usual.   Avoid tobacco use.  Limit alcohol intake to no more than 1 drink per day for nonpregnant women and 2 drinks per day for men. You should drink alcohol only when you are also eating food. Talk with your caregiver about whether alcohol is safe for you. Tell your caregiver if you drink  alcohol several times a week.  Follow up with your caregiver regularly.  Schedule an eye exam within 5 years of diagnosis and then annually.  Perform daily skin and foot care. Examine your skin and feet daily for cuts, bruises, redness, nail problems, bleeding, blisters, or sores. A foot exam by a caregiver should be done annually.  Brush your teeth and gums at least twice a day and floss at least once a day. Follow up with your dentist regularly.  Share your diabetes management plan with your workplace or school.  Stay up-to-date with immunizations.  Learn to manage stress.  Obtain ongoing diabetes education and support as needed.  Participate or seek rehabilitation as needed to maintain or improve independence and quality of life. Request a physical or occupational therapy referral if you are having foot or hand numbness or difficulties with grooming, dressing, eating, or physical activity. SEEK MEDICAL CARE IF:   You are unable to eat food or drink fluids for more than 6 hours.  You have nausea and vomiting for more than 6 hours.  Your blood glucose level is over 240 mg/dL.  There is a change in mental status.  You develop an additional  serious illness.  You have diarrhea for more than 6 hours.  You have been sick or have had a fever for a couple of days and are not getting better.  You have pain during any physical activity. SEEK IMMEDIATE MEDICAL CARE IF:  You have difficulty breathing.  You have moderate to large ketone levels. MAKE SURE YOU:  Understand these instructions.  Will watch your condition.  Will get help right away if you are not doing well or get worse. Document Released: 12/07/2000 Document Revised: 09/03/2012 Document Reviewed: 07/08/2012 [facility] Patient Information 2014 [place].

## 2014-04-28 ENCOUNTER — Other Ambulatory Visit: Payer: Self-pay | Admitting: *Deleted

## 2014-04-28 MED ORDER — ALPRAZOLAM 1 MG PO TABS: ORAL_TABLET | ORAL | Status: DC

## 2014-05-05 ENCOUNTER — Other Ambulatory Visit: Payer: Self-pay | Admitting: Emergency Medicine

## 2014-05-05 ENCOUNTER — Other Ambulatory Visit: Payer: Self-pay | Admitting: Internal Medicine

## 2014-05-05 MED ORDER — BISOPROLOL FUMARATE 10 MG PO TABS: ORAL_TABLET | ORAL | Status: DC

## 2014-05-28 ENCOUNTER — Encounter: Payer: Self-pay | Admitting: Emergency Medicine

## 2014-05-28 ENCOUNTER — Ambulatory Visit (INDEPENDENT_AMBULATORY_CARE_PROVIDER_SITE_OTHER): Payer: Medicare Other | Admitting: Emergency Medicine

## 2014-05-28 VITALS — BP 122/68 | HR 72 | Temp 97.8°F | Resp 16 | Ht 65.0 in | Wt 116.0 lb

## 2014-05-28 DIAGNOSIS — I10 Essential (primary) hypertension: Secondary | ICD-10-CM

## 2014-05-28 DIAGNOSIS — E119 Type 2 diabetes mellitus without complications: Secondary | ICD-10-CM

## 2014-05-28 DIAGNOSIS — E782 Mixed hyperlipidemia: Secondary | ICD-10-CM

## 2014-05-28 NOTE — Progress Notes (Signed)
Subjective:    Patient ID: Brandy Short, female    DOB: Apr 08, 1941, 73 y.o.   MRN: [id number]  HPI Comments: 73 yo presents for 3 month F/U for HTN, Cholesterol, DM, D. Deficient. SHe checks feet routinely and denies skin break down or neuropathy increase. She has recently started Insulin due to poor A1c. She notes BS dropping at night and has decreased dose to 17 units in a.m. She is feeling less stressed with eyes getting better with surgery. She thinks stress decreasing will help with DM. BS 200s in a.m. 100 or less at night. She is only taking 2 pills a day of MF she was on 3. She is eating healthy and keeps busy.  WBC             6.7   02/24/2014 HGB            10.8   02/24/2014 HCT            32.6   02/24/2014 PLT             272   02/24/2014 GLUCOSE         249   02/24/2014 CHOL            197   02/24/2014 TRIG            187   02/24/2014 HDL              48   02/24/2014 LDLCALC         112   02/24/2014 ALT               8   02/24/2014 AST              14   02/24/2014 NA              138   02/24/2014 K               4.1   02/24/2014 CL              102   02/24/2014 CREATININE     1.00   02/24/2014 BUN              23   02/24/2014 CO2              25   02/24/2014 TSH           2.790   02/24/2014 HGBA1C         10.3   02/24/2014   Hypertension  Gastrophageal Reflux Associated symptoms include fatigue.  Diabetes Associated symptoms include fatigue.      Medication List       This list is accurate as of: 05/28/14 10:22 AM.  Always use your most recent med list.               acidophilus Caps capsule  Take 1 capsule by mouth daily.     albuterol 108 (90 BASE) MCG/ACT inhaler  Commonly known as:  PROVENTIL HFA;VENTOLIN HFA  Inhale 1 puff into the lungs 2 (two) times daily as needed for wheezing or shortness of breath.     ALPRAZolam 1 MG tablet  Commonly known as:  XANAX  Take 1/2 -1 tab at bedtime prn sleep     betamethasone dipropionate 0.05 % cream  Commonly known as:  DIPROLENE  Apply 1  application topically daily. Uses for skin cancer     bisoprolol 10 MG tablet  Commonly known as:  ZEBETA  Take 1/2 to 1 tablet daily as directed for BP     brimonidine 0.15 % ophthalmic solution  Commonly known as:  ALPHAGAN  1 drop.     cholecalciferol 1000 UNITS tablet  Commonly known as:  VITAMIN D  Take 1,000 Units by mouth daily. Takes 3 qd     clobetasol 0.05 % external solution  Commonly known as:  TEMOVATE  Apply 1 application topically 2 (two) times daily.     dorzolamide-timolol 22.3-6.8 MG/ML ophthalmic solution  Commonly known as:  COSOPT  Place 1 drop into the left eye 2 (two) times daily.     DUREZOL 0.05 % Emul  Generic drug:  Difluprednate  Apply 1 drop to eye 2 (two) times daily. 1 ggt to surgical eye BID     hydrochlorothiazide 25 MG tablet  Commonly known as:  HYDRODIURIL  Take 25 mg by mouth daily. Takes prn     Insulin Detemir 100 UNIT/ML Pen  Commonly known as:  LEVEMIR  Inject 50 units subcutaneous daily or as directed     losartan 100 MG tablet  Commonly known as:  COZAAR  Take 1 tablet by mouth  every day     LUMIGAN 0.01 % Soln  Generic drug:  bimatoprost     metFORMIN 500 MG 24 hr tablet  Commonly known as:  GLUCOPHAGE-XR  Take 2 tablets by mouth  twice a day for diabetes     ONE TOUCH ULTRA TEST test strip  Generic drug:  glucose blood  CHECK BLOOD SUGAR EVERY DAY TO 3 TIMES A DAY     phenylephrine 10 MG Tabs tablet  Commonly known as:  SUDAFED PE  Take 10 mg by mouth every 4 (four) hours as needed (for nasal congestion).     Red Yeast Rice 600 MG Caps  Take 600 mg by mouth daily.     vitamin B-12 100 MCG tablet  Commonly known as:  CYANOCOBALAMIN  Take 50 mcg by mouth daily. Takes 3 qd       Allergies  Allergen Reactions  . Actonel [Risedronate Sodium]     Dyspepsia  . Hyosol-Sl [Hyoscyamine] Other (See Comments)    Dry mouth  . Metformin And Related Diarrhea  . Phenobarbital Diarrhea  . Ace Inhibitors Rash  .  Dilaudid [Hydromorphone Hcl] Rash  . Quinine Derivatives Rash  . Sulfur Rash  . Tetracyclines & Related Rash    Skin peeled off    Past Medical History  Diagnosis Date  . Depression   . Diverticulosis   . Adenomatous colon polyp   . Asthma   . Hypertension   . Mixed hyperlipidemia   . Type II or unspecified type diabetes mellitus without mention of complication, not stated as uncontrolled   . Anemia   . GERD (gastroesophageal reflux disease)   . Vitamin D deficiency   . Glaucoma   . Diverticulitis      Review of Systems  Constitutional: Positive for fatigue.  All other systems reviewed and are negative.  BP 122/68  Pulse 72  Temp(Src) 97.8 F (36.6 C) (Temporal)  Resp 16  Ht 5\' 5"  (1.651 m)  Wt 116 lb (52.617 kg)  BMI 19.30 kg/m2     Objective:   Physical Exam  Nursing note and vitals reviewed. Constitutional: She is oriented to person, place, and time. She appears well-developed and well-nourished. No distress.  HENT:  Head: Normocephalic and atraumatic.  Right Ear: External ear normal.  Left Ear: External  ear normal.  Nose: Nose normal.  Mouth/Throat: Oropharynx is clear and moist.  Eyes: Conjunctivae and EOM are normal.  Neck: Normal range of motion. Neck supple. No JVD present. No thyromegaly present.  Cardiovascular: Normal rate, regular rhythm, normal heart sounds and intact distal pulses.   Pulmonary/Chest: Effort normal and breath sounds normal.  Abdominal: Soft. Bowel sounds are normal. She exhibits no distension and no mass. There is no tenderness. There is no rebound and no guarding.  Musculoskeletal: Normal range of motion. She exhibits no edema and no tenderness.  Lymphadenopathy:    She has no cervical adenopathy.  Neurological: She is alert and oriented to person, place, and time. No cranial nerve deficit.  Skin: Skin is warm and dry. No rash noted. No erythema. No pallor.  Psychiatric: She has a normal mood and affect. Her behavior is normal.  Judgment and thought content normal.          Assessment & Plan:  1.  3 month F/U for HTN, Cholesterol,DM, D. Deficient. Needs healthy diet, cardio QD and obtain healthy weight. Check Labs, Check BP if >130/80 call office, Check BS if >200 call office  2. Hypoglycemia- Change levemir dosing to late evening. Decrease Insulin to 15 units , increase MF to BID. Continue to eat small portions and increase protein snacks, call with results.

## 2014-06-02 ENCOUNTER — Other Ambulatory Visit: Payer: Medicare Other

## 2014-06-02 ENCOUNTER — Other Ambulatory Visit: Payer: Self-pay | Admitting: Emergency Medicine

## 2014-06-02 ENCOUNTER — Other Ambulatory Visit: Payer: Self-pay

## 2014-06-02 LAB — LIPID PANEL
CHOL/HDL RATIO: 3.6 ratio
Cholesterol: 167 mg/dL (ref 0–200)
HDL: 46 mg/dL (ref >39)
LDL Cholesterol: 79 mg/dL (ref 0–99)
Triglycerides: 209 mg/dL — ABNORMAL HIGH (ref <150)
VLDL: 42 mg/dL — ABNORMAL HIGH (ref 0–40)

## 2014-06-02 LAB — CBC WITH DIFFERENTIAL/PLATELET
Basophils Absolute: 0.1 10*3/uL (ref 0.0–0.1)
Basophils Relative: 1 % (ref 0–1)
Eosinophils Absolute: 0.2 10*3/uL (ref 0.0–0.7)
Eosinophils Relative: 2 % (ref 0–5)
HCT: 31.9 % — ABNORMAL LOW (ref 36.0–46.0)
Hemoglobin: 10.8 g/dL — ABNORMAL LOW (ref 12.0–15.0)
LYMPHS PCT: 11 % — AB (ref 12–46)
Lymphs Abs: 0.8 10*3/uL (ref 0.7–4.0)
MCH: 32.9 pg (ref 26.0–34.0)
MCHC: 33.9 g/dL (ref 30.0–36.0)
MCV: 97.3 fL (ref 78.0–100.0)
Monocytes Absolute: 0.5 10*3/uL (ref 0.1–1.0)
Monocytes Relative: 7 % (ref 3–12)
NEUTROS PCT: 79 % — AB (ref 43–77)
Neutro Abs: 6 10*3/uL (ref 1.7–7.7)
PLATELETS: 300 10*3/uL (ref 150–400)
RBC: 3.28 MIL/uL — ABNORMAL LOW (ref 3.87–5.11)
RDW: 13.3 % (ref 11.5–15.5)
WBC: 7.6 10*3/uL (ref 4.0–10.5)

## 2014-06-02 LAB — BASIC METABOLIC PANEL WITH GFR
BUN: 27 mg/dL — ABNORMAL HIGH (ref 6–23)
CO2: 27 mEq/L (ref 19–32)
Calcium: 9.2 mg/dL (ref 8.4–10.5)
Chloride: 105 mEq/L (ref 96–112)
Creat: 1.29 mg/dL — ABNORMAL HIGH (ref 0.50–1.10)
GFR, EST AFRICAN AMERICAN: 47 mL/min — AB
GFR, Est Non African American: 41 mL/min — ABNORMAL LOW
Glucose, Bld: 272 mg/dL — ABNORMAL HIGH (ref 70–99)
Potassium: 5.1 mEq/L (ref 3.5–5.3)
SODIUM: 142 meq/L (ref 135–145)

## 2014-06-02 LAB — HEPATIC FUNCTION PANEL
ALBUMIN: 3.3 g/dL — AB (ref 3.5–5.2)
ALK PHOS: 86 U/L (ref 39–117)
ALT: 10 U/L (ref 0–35)
AST: 14 U/L (ref 0–37)
BILIRUBIN TOTAL: 0.6 mg/dL (ref 0.2–1.2)
Bilirubin, Direct: 0.1 mg/dL (ref 0.0–0.3)
Indirect Bilirubin: 0.5 mg/dL (ref 0.2–1.2)
Total Protein: 6.1 g/dL (ref 6.0–8.3)

## 2014-06-02 LAB — HEMOGLOBIN A1C
Hgb A1c MFr Bld: 9.3 % — ABNORMAL HIGH (ref <5.7)
Mean Plasma Glucose: 220 mg/dL — ABNORMAL HIGH (ref <117)

## 2014-06-03 LAB — IRON AND TIBC
%SAT: 30 % (ref 20–55)
Iron: 91 ug/dL (ref 42–145)
TIBC: 303 ug/dL (ref 250–470)
UIBC: 212 ug/dL (ref 125–400)

## 2014-06-03 LAB — VITAMIN B12: Vitamin B-12: 438 pg/mL (ref 211–911)

## 2014-06-24 ENCOUNTER — Encounter (INDEPENDENT_AMBULATORY_CARE_PROVIDER_SITE_OTHER): Payer: Medicare Other | Admitting: Ophthalmology

## 2014-06-24 DIAGNOSIS — E11311 Type 2 diabetes mellitus with unspecified diabetic retinopathy with macular edema: Secondary | ICD-10-CM

## 2014-06-24 DIAGNOSIS — E1165 Type 2 diabetes mellitus with hyperglycemia: Secondary | ICD-10-CM

## 2014-06-24 DIAGNOSIS — E1139 Type 2 diabetes mellitus with other diabetic ophthalmic complication: Secondary | ICD-10-CM

## 2014-06-24 DIAGNOSIS — I10 Essential (primary) hypertension: Secondary | ICD-10-CM

## 2014-06-24 DIAGNOSIS — H43819 Vitreous degeneration, unspecified eye: Secondary | ICD-10-CM

## 2014-06-24 DIAGNOSIS — H35039 Hypertensive retinopathy, unspecified eye: Secondary | ICD-10-CM

## 2014-06-24 DIAGNOSIS — E11319 Type 2 diabetes mellitus with unspecified diabetic retinopathy without macular edema: Secondary | ICD-10-CM

## 2014-07-28 ENCOUNTER — Ambulatory Visit (INDEPENDENT_AMBULATORY_CARE_PROVIDER_SITE_OTHER): Payer: Medicare Other | Admitting: *Deleted

## 2014-07-28 DIAGNOSIS — N390 Urinary tract infection, site not specified: Secondary | ICD-10-CM

## 2014-07-28 MED ORDER — CIPROFLOXACIN HCL 500 MG PO TABS: 500.0000 mg | ORAL_TABLET | Freq: Two times a day (BID) | ORAL | Status: AC

## 2014-07-28 NOTE — Progress Notes (Signed)
Patient ID: Brandy Short, female   DOB: [birth year], 73 y.o.   MRN: [id number] Patient presents with c/o UTI symptoms.  Positive burning, increased frequency with decreased urine output.  Per Vicie Mutters, PA-C orders, patient will do UA,C&S today and Cipro abx will be sent into pharm to help relieve symptoms until culture is received.  Patient aware we may need to change abx at that time.

## 2014-07-28 NOTE — Addendum Note (Signed)
Addended by: Anastasio Wogan A on: 07/28/2014 10:12 AM   Modules accepted: Level of Service

## 2014-07-29 LAB — URINALYSIS, ROUTINE W REFLEX MICROSCOPIC
Bilirubin Urine: NEGATIVE
GLUCOSE, UA: NEGATIVE mg/dL
Ketones, ur: NEGATIVE mg/dL
Nitrite: NEGATIVE
Protein, ur: >300 mg/dL — AB
SPECIFIC GRAVITY, URINE: 1.017 (ref 1.005–1.030)
UROBILINOGEN UA: 0.2 mg/dL (ref 0.0–1.0)
pH: 6 (ref 5.0–8.0)

## 2014-07-29 LAB — URINALYSIS, MICROSCOPIC ONLY
BACTERIA UA: NONE SEEN
CRYSTALS: NONE SEEN
Casts: NONE SEEN
Squamous Epithelial / LPF: NONE SEEN

## 2014-07-31 LAB — URINE CULTURE

## 2014-08-03 ENCOUNTER — Ambulatory Visit (INDEPENDENT_AMBULATORY_CARE_PROVIDER_SITE_OTHER): Payer: Medicare Other | Admitting: Internal Medicine

## 2014-08-03 ENCOUNTER — Encounter: Payer: Self-pay | Admitting: Internal Medicine

## 2014-08-03 VITALS — BP 136/66 | HR 76 | Temp 98.2°F | Resp 16 | Ht 65.0 in | Wt 119.8 lb

## 2014-08-03 DIAGNOSIS — IMO0002 Reserved for concepts with insufficient information to code with codable children: Secondary | ICD-10-CM

## 2014-08-03 DIAGNOSIS — S60519A Abrasion of unspecified hand, initial encounter: Secondary | ICD-10-CM

## 2014-08-03 NOTE — Progress Notes (Signed)
   Subjective:    Patient ID: Brandy Short, female    DOB: [birth year], 73 y.o.   MRN: [id number]  HPI very nice 73 yo WWF T2_NIDDM apparently fell on July 30 w/o LOS , aura, palpitations, dizziness/vertigo. She relates she just tripped . Sustained skin flap lac of Rt hand and minor 1/2 cm abrasion on dorsal Lt Index finger.  Review of Systems Neg  Objective:   Physical Exam BP 136/66  Pulse 76  Temp(Src) 98.2 F (36.8 C) (Temporal)  Resp 16  Ht 5\' 5"  (1.651 m)  Wt 119 lb 12.8 oz (54.341 kg)  BMI 19.94 kg/m2  Above wounds clean w/o signs of infection.  Assessment & Plan:   1. Superficial Laceration(s) of hand(s)  Advise continue  same wound care cleaning with soap and water and applying Antibiotic ung.

## 2014-08-09 ENCOUNTER — Encounter (HOSPITAL_COMMUNITY): Payer: Self-pay | Admitting: Emergency Medicine

## 2014-08-09 ENCOUNTER — Emergency Department (HOSPITAL_COMMUNITY): Payer: Medicare Other

## 2014-08-09 ENCOUNTER — Emergency Department (HOSPITAL_COMMUNITY)
Admission: EM | Admit: 2014-08-09 | Discharge: 2014-08-09 | Disposition: A | Discharge status: Home / Self Care | Payer: Medicare Other | Attending: Emergency Medicine | Admitting: Emergency Medicine

## 2014-08-09 DIAGNOSIS — S46909A Unspecified injury of unspecified muscle, fascia and tendon at shoulder and upper arm level, unspecified arm, initial encounter: Secondary | ICD-10-CM | POA: Insufficient documentation

## 2014-08-09 DIAGNOSIS — Z862 Personal history of diseases of the blood and blood-forming organs and certain disorders involving the immune mechanism: Secondary | ICD-10-CM | POA: Insufficient documentation

## 2014-08-09 DIAGNOSIS — Z79899 Other long term (current) drug therapy: Secondary | ICD-10-CM | POA: Insufficient documentation

## 2014-08-09 DIAGNOSIS — I10 Essential (primary) hypertension: Secondary | ICD-10-CM | POA: Insufficient documentation

## 2014-08-09 DIAGNOSIS — J45909 Unspecified asthma, uncomplicated: Secondary | ICD-10-CM | POA: Diagnosis not present

## 2014-08-09 DIAGNOSIS — Y9269 Other specified industrial and construction area as the place of occurrence of the external cause: Secondary | ICD-10-CM | POA: Diagnosis not present

## 2014-08-09 DIAGNOSIS — Z8719 Personal history of other diseases of the digestive system: Secondary | ICD-10-CM | POA: Insufficient documentation

## 2014-08-09 DIAGNOSIS — E119 Type 2 diabetes mellitus without complications: Secondary | ICD-10-CM | POA: Diagnosis not present

## 2014-08-09 DIAGNOSIS — S4980XA Other specified injuries of shoulder and upper arm, unspecified arm, initial encounter: Secondary | ICD-10-CM | POA: Diagnosis present

## 2014-08-09 DIAGNOSIS — S42209A Unspecified fracture of upper end of unspecified humerus, initial encounter for closed fracture: Secondary | ICD-10-CM | POA: Insufficient documentation

## 2014-08-09 DIAGNOSIS — Y9389 Activity, other specified: Secondary | ICD-10-CM | POA: Insufficient documentation

## 2014-08-09 DIAGNOSIS — Z87891 Personal history of nicotine dependence: Secondary | ICD-10-CM | POA: Insufficient documentation

## 2014-08-09 DIAGNOSIS — Z860100 Personal history of colon polyps, unspecified: Secondary | ICD-10-CM | POA: Insufficient documentation

## 2014-08-09 DIAGNOSIS — S42201A Unspecified fracture of upper end of right humerus, initial encounter for closed fracture: Secondary | ICD-10-CM

## 2014-08-09 DIAGNOSIS — Z794 Long term (current) use of insulin: Secondary | ICD-10-CM | POA: Insufficient documentation

## 2014-08-09 DIAGNOSIS — Z8659 Personal history of other mental and behavioral disorders: Secondary | ICD-10-CM | POA: Diagnosis not present

## 2014-08-09 DIAGNOSIS — H409 Unspecified glaucoma: Secondary | ICD-10-CM | POA: Insufficient documentation

## 2014-08-09 DIAGNOSIS — W010XXA Fall on same level from slipping, tripping and stumbling without subsequent striking against object, initial encounter: Secondary | ICD-10-CM | POA: Diagnosis not present

## 2014-08-09 DIAGNOSIS — IMO0002 Reserved for concepts with insufficient information to code with codable children: Secondary | ICD-10-CM | POA: Insufficient documentation

## 2014-08-09 LAB — CBG MONITORING, ED
Glucose-Capillary: 324 mg/dL — ABNORMAL HIGH (ref 70–99)
Glucose-Capillary: 156 mg/dL — ABNORMAL HIGH (ref 70–99)

## 2014-08-09 MED ORDER — ACETAMINOPHEN 325 MG PO TABS
650.0000 mg | ORAL_TABLET | Freq: Once | ORAL | Status: AC
  Administered 2014-08-09: 650 mg via ORAL

## 2014-08-09 MED ORDER — HYDROCODONE-ACETAMINOPHEN 5-325 MG PO TABS: 1.0000 | ORAL_TABLET | Freq: Four times a day (QID) | ORAL | Status: DC | PRN

## 2014-08-09 NOTE — ED Notes (Signed)
Pt A&OX4 at d/c, NAD

## 2014-08-09 NOTE — ED Notes (Signed)
Pt ambulated in hallway with RN and EMT. Steady gait.

## 2014-08-09 NOTE — ED Notes (Signed)
cbg 324 at triage

## 2014-08-09 NOTE — Progress Notes (Signed)
Orthopedic Tech Progress Note Patient Details:  Brandy Short [birth year] [id number]  Ortho Devices Type of Ortho Device: Shoulder immobilizer Ortho Device/Splint Location: RUE Ortho Device/Splint Interventions: Ordered;Application   Braulio Bosch 08/09/2014, 10:28 PM

## 2014-08-09 NOTE — ED Notes (Signed)
Paged ortho tech 

## 2014-08-09 NOTE — ED Notes (Signed)
Ortho tech at bedside 

## 2014-08-09 NOTE — ED Provider Notes (Signed)
CSN: [id number]     Arrival date & time 08/09/14  1640 History   First MD Initiated Contact with Patient 08/09/14 2202     Chief Complaint  Patient presents with  . Fall  . Hyperglycemia     Patient is a 73 y.o. female presenting with fall and hyperglycemia. The history is provided by the patient.  Fall This is a new problem. The current episode started 6 to 12 hours ago. Pertinent negatives include no chest pain, no abdominal pain, no headaches and no shortness of breath. Exacerbated by: movement. She has tried rest for the symptoms. The treatment provided mild relief.  Hyperglycemia Associated symptoms: no abdominal pain, no chest pain, no fever, no shortness of breath and no vomiting   Pt reports she was working in her garage when she lost her balance and fell onto her right shoulder No head injury No LOC No neck or back pain No cp/sob No proceeding weakness/dizziness prior to fall  She also reports increased glucose at home but no other associated symptoms She reports recent treatment for UTI and this has elevated her glucose  Past Medical History  Diagnosis Date  . Depression   . Diverticulosis   . Adenomatous colon polyp   . Asthma   . Hypertension   . Mixed hyperlipidemia   . Type II or unspecified type diabetes mellitus without mention of complication, not stated as uncontrolled   . Anemia   . GERD (gastroesophageal reflux disease)   . Vitamin D deficiency   . Glaucoma   . Diverticulitis    Past Surgical History  Procedure Laterality Date  . Abdominal hysterectomy    . Breast biopsy    . Cyst removed from lower spine    . Lithotripsy    . Eye surgery Bilateral 09/2003 Dr. Katy Fitch     Glaucoma   Family History  Problem Relation Age of Onset  . Breast cancer Sister   . Diabetes Sister   . Heart disease Sister   . Diabetes Mother   . Hypertension Mother   . Heart disease Mother   . Hyperlipidemia Sister   . Arthritis Brother   . Diabetes Sister   .  Esophageal cancer Neg Hx   . Rectal cancer Neg Hx   . Stomach cancer Neg Hx   . Colon cancer Neg Hx   . Heart disease Father   . Diabetes Brother    History  Substance Use Topics  . Smoking status: Former Smoker    Quit date: 12/24/2002  . Smokeless tobacco: Never Used  . Alcohol Use: Yes     Comment: ocassional   OB History   Grav Para Term Preterm Abortions TAB SAB Ect Mult Living                 Review of Systems  Constitutional: Negative for fever.  Respiratory: Negative for shortness of breath.   Cardiovascular: Negative for chest pain.  Gastrointestinal: Negative for vomiting and abdominal pain.  Musculoskeletal: Positive for arthralgias. Negative for back pain and neck pain.  Neurological: Negative for headaches.  All other systems reviewed and are negative.     Allergies  Actonel; Codeine; Hyosol-sl; Metformin and related; Phenobarbital; Ace inhibitors; Dilaudid; Quinine derivatives; Sulfur; and Tetracyclines & related  Home Medications   Prior to Admission medications   Medication Sig Start Date End Date Taking? Authorizing Provider  acidophilus (RISAQUAD) CAPS Take 1 capsule by mouth daily.    Yes Historical Provider, MD  albuterol (  PROVENTIL HFA;VENTOLIN HFA) 108 (90 BASE) MCG/ACT inhaler Inhale 1 puff into the lungs 2 (two) times daily as needed for wheezing or shortness of breath.   Yes Historical Provider, MD  betamethasone dipropionate (DIPROLENE) 0.05 % cream Apply 1 application topically daily. Uses for skin cancer 08/04/13  Yes Historical Provider, MD  bisoprolol (ZEBETA) 10 MG tablet Take 1/2 to 1 tablet daily as directed for BP 05/05/14  Yes Unk Pinto, MD  brimonidine (ALPHAGAN) 0.15 % ophthalmic solution 1 drop.  01/08/14  Yes Historical Provider, MD  Calcium Carbonate (CALCIUM 600 PO) Take 1 tablet by mouth daily.   Yes Historical Provider, MD  clobetasol (TEMOVATE) 0.05 % external solution Apply 1 application topically 2 (two) times daily.   08/19/13  Yes Historical Provider, MD  Cyanocobalamin (VITAMIN B 12 PO) Take 1,000 mcg by mouth daily.   Yes Historical Provider, MD  Difluprednate (DUREZOL) 0.05 % EMUL Apply 1 drop to eye 2 (two) times daily. 1 ggt to surgical eye BID   Yes Historical Provider, MD  dorzolamide-timolol (COSOPT) 22.3-6.8 MG/ML ophthalmic solution Place 1 drop into the left eye 2 (two) times daily.  08/19/13  Yes Historical Provider, MD  hydrochlorothiazide (HYDRODIURIL) 25 MG tablet Take 25 mg by mouth daily as needed (fluid retention). Takes prn   Yes Historical Provider, MD  Insulin Detemir (LEVEMIR) 100 UNIT/ML Pen Inject 50 units subcutaneous daily or as directed 02/24/14  Yes Unk Pinto, MD  losartan (COZAAR) 100 MG tablet Take 1 tablet by mouth  every day   Yes Vicie Mutters, PA-C  LUMIGAN 0.01 % SOLN Place 1 drop into both eyes at bedtime.  12/16/13  Yes Historical Provider, MD  metFORMIN (GLUCOPHAGE-XR) 500 MG 24 hr tablet Take 1,000 mg by mouth 2 (two) times daily.   Yes Historical Provider, MD  ONE TOUCH ULTRA TEST test strip CHECK BLOOD SUGAR EVERY DAY TO 3 TIMES A DAY 03/15/14  Yes Melissa R Smith, PA-C  Red Yeast Rice 600 MG CAPS Take 600 mg by mouth daily.     Yes Historical Provider, MD   BP 134/67  Pulse 72  Temp(Src) 98 F (36.7 C) (Oral)  Resp 19  SpO2 98% Physical Exam CONSTITUTIONAL: Well developed/well nourished HEAD: Normocephalic/atraumatic EYES: EOMI/PERRL ENMT: Mucous membranes moist, no evidence of facial trauma NECK: supple no meningeal signs SPINE:entire spine nontender CV: S1/S2 noted, no murmurs/rubs/gallops noted LUNGS: Lungs are clear to auscultation bilaterally, no apparent distress ABDOMEN: soft, nontender, no rebound or guarding NEURO: Pt is awake/alert, moves all extremitiesx4 EXTREMITIES: pulses normal/equal in bilateral UE Tenderness to palpation of right shoulder.  No deformity is noted No tenderness with palpation and ROM of right elbow No injury noted to right  elbow No lower extremity tenderness is noted SKIN: warm, color normal PSYCH: no abnormalities of mood noted  ED Course  Procedures   SPLINT APPLICATION Date/Time: 31:51 PM Authorized by: Sharyon Cable Consent: Verbal consent obtained. Risks and benefits: risks, benefits and alternatives were discussed Consent given by: patient Splint applied by: orthopedic technician Location details: right UE Splint type: sling/immobilizer Supplies used: sling Post-procedure: The splinted body part was neurovascularly unchanged following the procedure. Patient tolerance: Patient tolerated the procedure well with no immediate complications.    Pt with mechanical fall with nondisplaced right humerus fx She tolerated sling well and is ambulatory She has no other evidence of injury Will call her orthopedist (dr Eddie Dibbles) tomorrow Course of vicodin given to patient (she reports she can tolerate this) We discussed strict  return precautions  Labs Review Labs Reviewed  CBG MONITORING, ED - Abnormal; Notable for the following:    Glucose-Capillary 324 (*)    All other components within normal limits  CBG MONITORING, ED - Abnormal; Notable for the following:    Glucose-Capillary 156 (*)    All other components within normal limits    Imaging Review Dg Shoulder Right  08/09/2014   CLINICAL DATA:  Patient tripped and fell this afternoon. Landed on the shoulder. Pain and decreased range of motion.  EXAM: RIGHT SHOULDER - 2+ VIEW  COMPARISON:  None  FINDINGS: There is an oblique fracture of the right humeral neck. No dislocation. The scapula appears intact.  IMPRESSION: Fracture of the right humeral neck.   Electronically Signed   By: Shon Hale M.D.   On: 08/09/2014 19:14      MDM   Final diagnoses:  Closed fracture of right proximal humerus, initial encounter    Nursing notes including past medical history and social history reviewed and considered in documentation xrays reviewed and  considered Labs/vital reviewed and considered     Sharyon Cable, MD 08/09/14 2316

## 2014-08-09 NOTE — ED Notes (Signed)
Pt monitored by pulse ox, bp cuff, and 5-lead. 

## 2014-08-09 NOTE — ED Notes (Signed)
Dr. Wickline at bedside.  

## 2014-08-09 NOTE — ED Notes (Signed)
Pt reports tripping and falling this afternoon, landed on right shoulder. Having pain and decreased rom to right arm, +radial pulse. Also reports having hyperglycemia x 1 month and reports cbg 463 pta.

## 2014-08-09 NOTE — ED Notes (Signed)
Repeat CBG 156

## 2014-08-09 NOTE — ED Notes (Addendum)
Pt states that she was placed on an antibiotic for a few weeks for a UTI, stopped taking the antibiotic on Thursday. States that while on the antibiotic and since taking it her glucose has been higher than usual. Has been taking her insulin the same. States that she just started taking insulin in April/May.

## 2014-08-18 ENCOUNTER — Other Ambulatory Visit: Payer: Self-pay | Admitting: Orthopedic Surgery

## 2014-08-18 ENCOUNTER — Encounter (HOSPITAL_COMMUNITY): Payer: Self-pay | Admitting: *Deleted

## 2014-08-19 ENCOUNTER — Inpatient Hospital Stay (HOSPITAL_COMMUNITY): Payer: Medicare Other

## 2014-08-19 ENCOUNTER — Encounter (HOSPITAL_COMMUNITY)
Admission: RE | Disposition: A | Discharge status: Skilled Nursing Facility | Payer: Self-pay | Source: Ambulatory Visit | Attending: Internal Medicine

## 2014-08-19 ENCOUNTER — Encounter (HOSPITAL_COMMUNITY): Payer: Medicare Other | Admitting: Anesthesiology

## 2014-08-19 ENCOUNTER — Inpatient Hospital Stay (HOSPITAL_COMMUNITY)
Admission: RE | Admit: 2014-08-19 | Discharge: 2014-08-26 | DRG: 208 | Disposition: A | Discharge status: Skilled Nursing Facility | Payer: Medicare Other | Source: Ambulatory Visit | Attending: Internal Medicine | Admitting: Internal Medicine

## 2014-08-19 ENCOUNTER — Ambulatory Visit (HOSPITAL_COMMUNITY): Payer: Medicare Other

## 2014-08-19 ENCOUNTER — Encounter (HOSPITAL_COMMUNITY): Payer: Self-pay | Admitting: Certified Registered Nurse Anesthetist

## 2014-08-19 ENCOUNTER — Ambulatory Visit (HOSPITAL_COMMUNITY): Payer: Medicare Other | Admitting: Anesthesiology

## 2014-08-19 DIAGNOSIS — H409 Unspecified glaucoma: Secondary | ICD-10-CM

## 2014-08-19 DIAGNOSIS — Z8249 Family history of ischemic heart disease and other diseases of the circulatory system: Secondary | ICD-10-CM

## 2014-08-19 DIAGNOSIS — I2510 Atherosclerotic heart disease of native coronary artery without angina pectoris: Secondary | ICD-10-CM

## 2014-08-19 DIAGNOSIS — N179 Acute kidney failure, unspecified: Secondary | ICD-10-CM | POA: Diagnosis not present

## 2014-08-19 DIAGNOSIS — I214 Non-ST elevation (NSTEMI) myocardial infarction: Secondary | ICD-10-CM | POA: Diagnosis not present

## 2014-08-19 DIAGNOSIS — Z5309 Procedure and treatment not carried out because of other contraindication: Secondary | ICD-10-CM

## 2014-08-19 DIAGNOSIS — Z791 Long term (current) use of non-steroidal anti-inflammatories (NSAID): Secondary | ICD-10-CM

## 2014-08-19 DIAGNOSIS — N183 Chronic kidney disease, stage 3 unspecified: Secondary | ICD-10-CM | POA: Diagnosis present

## 2014-08-19 DIAGNOSIS — Z888 Allergy status to other drugs, medicaments and biological substances status: Secondary | ICD-10-CM | POA: Diagnosis not present

## 2014-08-19 DIAGNOSIS — J9600 Acute respiratory failure, unspecified whether with hypoxia or hypercapnia: Secondary | ICD-10-CM | POA: Diagnosis present

## 2014-08-19 DIAGNOSIS — R7989 Other specified abnormal findings of blood chemistry: Secondary | ICD-10-CM

## 2014-08-19 DIAGNOSIS — E1129 Type 2 diabetes mellitus with other diabetic kidney complication: Secondary | ICD-10-CM | POA: Diagnosis present

## 2014-08-19 DIAGNOSIS — Z0389 Encounter for observation for other suspected diseases and conditions ruled out: Secondary | ICD-10-CM

## 2014-08-19 DIAGNOSIS — R092 Respiratory arrest: Secondary | ICD-10-CM

## 2014-08-19 DIAGNOSIS — Z79899 Other long term (current) drug therapy: Secondary | ICD-10-CM

## 2014-08-19 DIAGNOSIS — I2584 Coronary atherosclerosis due to calcified coronary lesion: Secondary | ICD-10-CM | POA: Diagnosis present

## 2014-08-19 DIAGNOSIS — K219 Gastro-esophageal reflux disease without esophagitis: Secondary | ICD-10-CM

## 2014-08-19 DIAGNOSIS — Z885 Allergy status to narcotic agent status: Secondary | ICD-10-CM | POA: Diagnosis not present

## 2014-08-19 DIAGNOSIS — Z882 Allergy status to sulfonamides status: Secondary | ICD-10-CM | POA: Diagnosis not present

## 2014-08-19 DIAGNOSIS — D649 Anemia, unspecified: Secondary | ICD-10-CM | POA: Diagnosis present

## 2014-08-19 DIAGNOSIS — R072 Precordial pain: Secondary | ICD-10-CM

## 2014-08-19 DIAGNOSIS — J45909 Unspecified asthma, uncomplicated: Secondary | ICD-10-CM | POA: Diagnosis present

## 2014-08-19 DIAGNOSIS — Z794 Long term (current) use of insulin: Secondary | ICD-10-CM

## 2014-08-19 DIAGNOSIS — I129 Hypertensive chronic kidney disease with stage 1 through stage 4 chronic kidney disease, or unspecified chronic kidney disease: Secondary | ICD-10-CM | POA: Diagnosis present

## 2014-08-19 DIAGNOSIS — J690 Pneumonitis due to inhalation of food and vomit: Secondary | ICD-10-CM | POA: Diagnosis present

## 2014-08-19 DIAGNOSIS — Z803 Family history of malignant neoplasm of breast: Secondary | ICD-10-CM

## 2014-08-19 DIAGNOSIS — Z87891 Personal history of nicotine dependence: Secondary | ICD-10-CM | POA: Diagnosis not present

## 2014-08-19 DIAGNOSIS — G9340 Encephalopathy, unspecified: Secondary | ICD-10-CM

## 2014-08-19 DIAGNOSIS — W19XXXA Unspecified fall, initial encounter: Secondary | ICD-10-CM | POA: Diagnosis present

## 2014-08-19 DIAGNOSIS — E782 Mixed hyperlipidemia: Secondary | ICD-10-CM

## 2014-08-19 DIAGNOSIS — S42213A Unspecified displaced fracture of surgical neck of unspecified humerus, initial encounter for closed fracture: Secondary | ICD-10-CM | POA: Diagnosis present

## 2014-08-19 DIAGNOSIS — I359 Nonrheumatic aortic valve disorder, unspecified: Secondary | ICD-10-CM

## 2014-08-19 DIAGNOSIS — J9601 Acute respiratory failure with hypoxia: Secondary | ICD-10-CM

## 2014-08-19 DIAGNOSIS — I10 Essential (primary) hypertension: Secondary | ICD-10-CM | POA: Diagnosis present

## 2014-08-19 DIAGNOSIS — E559 Vitamin D deficiency, unspecified: Secondary | ICD-10-CM

## 2014-08-19 DIAGNOSIS — R778 Other specified abnormalities of plasma proteins: Secondary | ICD-10-CM

## 2014-08-19 DIAGNOSIS — Z833 Family history of diabetes mellitus: Secondary | ICD-10-CM

## 2014-08-19 DIAGNOSIS — N058 Unspecified nephritic syndrome with other morphologic changes: Secondary | ICD-10-CM | POA: Diagnosis present

## 2014-08-19 DIAGNOSIS — E1029 Type 1 diabetes mellitus with other diabetic kidney complication: Secondary | ICD-10-CM

## 2014-08-19 DIAGNOSIS — D508 Other iron deficiency anemias: Secondary | ICD-10-CM

## 2014-08-19 DIAGNOSIS — R9431 Abnormal electrocardiogram [ECG] [EKG]: Secondary | ICD-10-CM

## 2014-08-19 DIAGNOSIS — S42309A Unspecified fracture of shaft of humerus, unspecified arm, initial encounter for closed fracture: Secondary | ICD-10-CM | POA: Diagnosis present

## 2014-08-19 DIAGNOSIS — J810 Acute pulmonary edema: Secondary | ICD-10-CM

## 2014-08-19 HISTORY — PX: LEFT HEART CATHETERIZATION WITH CORONARY ANGIOGRAM: SHX5451

## 2014-08-19 HISTORY — DX: Pneumonia, unspecified organism: J18.9

## 2014-08-19 HISTORY — DX: Cardiac murmur, unspecified: R01.1

## 2014-08-19 HISTORY — DX: Malignant (primary) neoplasm, unspecified: C80.1

## 2014-08-19 LAB — TROPONIN I
TROPONIN I: 0.98 ng/mL — AB (ref <0.30)
Troponin I: 0.77 ng/mL (ref <0.30)

## 2014-08-19 LAB — COMPREHENSIVE METABOLIC PANEL
ALK PHOS: 76 U/L (ref 39–117)
ALT: 25 U/L (ref 0–35)
AST: 41 U/L — ABNORMAL HIGH (ref 0–37)
Albumin: 2.5 g/dL — ABNORMAL LOW (ref 3.5–5.2)
Anion gap: 14 (ref 5–15)
BUN: 24 mg/dL — ABNORMAL HIGH (ref 6–23)
CO2: 22 mEq/L (ref 19–32)
Calcium: 7.9 mg/dL — ABNORMAL LOW (ref 8.4–10.5)
Chloride: 105 mEq/L (ref 96–112)
Creatinine, Ser: 0.96 mg/dL (ref 0.50–1.10)
GFR calc non Af Amer: 57 mL/min — ABNORMAL LOW (ref >90)
GFR, EST AFRICAN AMERICAN: 66 mL/min — AB (ref >90)
GLUCOSE: 215 mg/dL — AB (ref 70–99)
POTASSIUM: 4.1 meq/L (ref 3.7–5.3)
Sodium: 141 mEq/L (ref 137–147)
Total Bilirubin: 0.7 mg/dL (ref 0.3–1.2)
Total Protein: 5.2 g/dL — ABNORMAL LOW (ref 6.0–8.3)

## 2014-08-19 LAB — GLUCOSE, CAPILLARY
GLUCOSE-CAPILLARY: 242 mg/dL — AB (ref 70–99)
GLUCOSE-CAPILLARY: 241 mg/dL — AB (ref 70–99)
GLUCOSE-CAPILLARY: 52 mg/dL — AB (ref 70–99)
Glucose-Capillary: 131 mg/dL — ABNORMAL HIGH (ref 70–99)
Glucose-Capillary: 163 mg/dL — ABNORMAL HIGH (ref 70–99)
Glucose-Capillary: 208 mg/dL — ABNORMAL HIGH (ref 70–99)
Glucose-Capillary: 88 mg/dL (ref 70–99)
Glucose-Capillary: 97 mg/dL (ref 70–99)

## 2014-08-19 LAB — BLOOD GAS, ARTERIAL
Acid-base deficit: 4.3 mmol/L — ABNORMAL HIGH (ref 0.0–2.0)
Acid-base deficit: 4 mmol/L — ABNORMAL HIGH (ref 0.0–2.0)
Bicarbonate: 24.2 mEq/L — ABNORMAL HIGH (ref 20.0–24.0)
Bicarbonate: 20.9 mEq/L (ref 20.0–24.0)
Drawn by: 206361
Drawn by: 249101
FIO2: 0.5 %
MECHVT: 460 mL
O2 Content: 15 L/min
O2 SAT: 99.6 %
O2 Saturation: 98.4 %
PATIENT TEMPERATURE: 98.6
PEEP: 5 cmH2O
Patient temperature: 98.6
RATE: 22 resp/min
TCO2: 26.6 mmol/L (ref 0–100)
TCO2: 22.2 mmol/L (ref 0–100)
pCO2 arterial: 80.3 mmHg (ref 35.0–45.0)
pCO2 arterial: 40.7 mmHg (ref 35.0–45.0)
pH, Arterial: 7.106 — CL (ref 7.350–7.450)
pH, Arterial: 7.332 — ABNORMAL LOW (ref 7.350–7.450)
pO2, Arterial: 161 mmHg — ABNORMAL HIGH (ref 80.0–100.0)
pO2, Arterial: 220 mmHg — ABNORMAL HIGH (ref 80.0–100.0)

## 2014-08-19 LAB — BASIC METABOLIC PANEL
ANION GAP: 13 (ref 5–15)
BUN: 25 mg/dL — ABNORMAL HIGH (ref 6–23)
CHLORIDE: 104 meq/L (ref 96–112)
CO2: 25 meq/L (ref 19–32)
Calcium: 9.1 mg/dL (ref 8.4–10.5)
Creatinine, Ser: 1.04 mg/dL (ref 0.50–1.10)
GFR calc non Af Amer: 52 mL/min — ABNORMAL LOW (ref >90)
GFR, EST AFRICAN AMERICAN: 60 mL/min — AB (ref >90)
Glucose, Bld: 143 mg/dL — ABNORMAL HIGH (ref 70–99)
POTASSIUM: 4.3 meq/L (ref 3.7–5.3)
SODIUM: 142 meq/L (ref 137–147)

## 2014-08-19 LAB — CBC
HCT: 26.9 % — ABNORMAL LOW (ref 36.0–46.0)
HCT: 23.9 % — ABNORMAL LOW (ref 36.0–46.0)
HEMOGLOBIN: 8.1 g/dL — AB (ref 12.0–15.0)
Hemoglobin: 9.1 g/dL — ABNORMAL LOW (ref 12.0–15.0)
MCH: 33.1 pg (ref 26.0–34.0)
MCH: 33.2 pg (ref 26.0–34.0)
MCHC: 33.8 g/dL (ref 30.0–36.0)
MCHC: 33.9 g/dL (ref 30.0–36.0)
MCV: 97.8 fL (ref 78.0–100.0)
MCV: 98 fL (ref 78.0–100.0)
Platelets: 331 10*3/uL (ref 150–400)
Platelets: 300 10*3/uL (ref 150–400)
RBC: 2.75 MIL/uL — ABNORMAL LOW (ref 3.87–5.11)
RBC: 2.44 MIL/uL — AB (ref 3.87–5.11)
RDW: 13.2 % (ref 11.5–15.5)
RDW: 13.1 % (ref 11.5–15.5)
WBC: 7.8 10*3/uL (ref 4.0–10.5)
WBC: 13 10*3/uL — ABNORMAL HIGH (ref 4.0–10.5)

## 2014-08-19 LAB — MAGNESIUM: Magnesium: 1.4 mg/dL — ABNORMAL LOW (ref 1.5–2.5)

## 2014-08-19 LAB — MRSA PCR SCREENING: MRSA by PCR: NEGATIVE

## 2014-08-19 LAB — POCT ACTIVATED CLOTTING TIME: ACTIVATED CLOTTING TIME: 101 s

## 2014-08-19 LAB — PHOSPHORUS: PHOSPHORUS: 4.1 mg/dL (ref 2.3–4.6)

## 2014-08-19 LAB — PROTIME-INR
INR: 1.16 (ref 0.00–1.49)
PROTHROMBIN TIME: 14.8 s (ref 11.6–15.2)

## 2014-08-19 SURGERY — LEFT HEART CATHETERIZATION WITH CORONARY ANGIOGRAM
Anesthesia: LOCAL

## 2014-08-19 SURGERY — OPEN REDUCTION INTERNAL FIXATION (ORIF) PROXIMAL HUMERUS FRACTURE
Anesthesia: General | Laterality: Right

## 2014-08-19 MED ORDER — HEPARIN (PORCINE) IN NACL 2-0.9 UNIT/ML-% IJ SOLN
INTRAMUSCULAR | Status: AC
  Filled 2014-08-19: qty 1000

## 2014-08-19 MED ORDER — PROPOFOL 10 MG/ML IV BOLUS
INTRAVENOUS | Status: DC | PRN
  Administered 2014-08-19: 40 mg via INTRAVENOUS
  Administered 2014-08-19: 50 mg via INTRAVENOUS

## 2014-08-19 MED ORDER — POVIDONE-IODINE 7.5 % EX SOLN
Freq: Once | CUTANEOUS | Status: DC
  Filled 2014-08-19: qty 118

## 2014-08-19 MED ORDER — SODIUM CHLORIDE 0.9 % IV SOLN: 250.0000 mL | INTRAVENOUS | Status: DC | PRN

## 2014-08-19 MED ORDER — CEFAZOLIN SODIUM-DEXTROSE 2-3 GM-% IV SOLR: 2.0000 g | INTRAVENOUS | Status: DC

## 2014-08-19 MED ORDER — MIDAZOLAM HCL 2 MG/2ML IJ SOLN
INTRAMUSCULAR | Status: AC
  Filled 2014-08-19: qty 2

## 2014-08-19 MED ORDER — LIDOCAINE HCL (CARDIAC) 20 MG/ML IV SOLN
INTRAVENOUS | Status: AC
  Filled 2014-08-19: qty 5

## 2014-08-19 MED ORDER — SODIUM CHLORIDE 0.9 % IV SOLN
INTRAVENOUS | Status: DC
  Administered 2014-08-19: 22:00:00 via INTRAVENOUS

## 2014-08-19 MED ORDER — HEPARIN SODIUM (PORCINE) 5000 UNIT/ML IJ SOLN: 5000.0000 [IU] | Freq: Three times a day (TID) | INTRAMUSCULAR | Status: DC

## 2014-08-19 MED ORDER — ALBUTEROL SULFATE (2.5 MG/3ML) 0.083% IN NEBU
2.5000 mg | INHALATION_SOLUTION | RESPIRATORY_TRACT | Status: DC | PRN
  Administered 2014-08-22 – 2014-08-23 (×2): 2.5 mg via RESPIRATORY_TRACT
  Filled 2014-08-19 (×2): qty 3

## 2014-08-19 MED ORDER — DEXMEDETOMIDINE HCL IN NACL 200 MCG/50ML IV SOLN
0.0000 ug/kg/h | INTRAVENOUS | Status: DC
  Administered 2014-08-19: 1 ug/kg/h via INTRAVENOUS
  Administered 2014-08-19: 0.3 ug/kg/h via INTRAVENOUS
  Filled 2014-08-19 (×2): qty 50

## 2014-08-19 MED ORDER — ROCURONIUM BROMIDE 50 MG/5ML IV SOLN
INTRAVENOUS | Status: AC
  Filled 2014-08-19: qty 2

## 2014-08-19 MED ORDER — ASPIRIN 81 MG PO CHEW
324.0000 mg | CHEWABLE_TABLET | ORAL | Status: AC
  Administered 2014-08-19: 324 mg via ORAL
  Filled 2014-08-19: qty 4

## 2014-08-19 MED ORDER — BUPIVACAINE-EPINEPHRINE (PF) 0.5% -1:200000 IJ SOLN
INTRAMUSCULAR | Status: DC | PRN
  Administered 2014-08-19: 20 mL via PERINEURAL

## 2014-08-19 MED ORDER — ONDANSETRON HCL 4 MG/2ML IJ SOLN
INTRAMUSCULAR | Status: AC
  Filled 2014-08-19: qty 2

## 2014-08-19 MED ORDER — GLYCOPYRROLATE 0.2 MG/ML IJ SOLN
INTRAMUSCULAR | Status: AC
  Filled 2014-08-19: qty 3

## 2014-08-19 MED ORDER — LACTATED RINGERS IV SOLN
INTRAVENOUS | Status: DC
  Administered 2014-08-19: 09:00:00 via INTRAVENOUS

## 2014-08-19 MED ORDER — ROCURONIUM BROMIDE 50 MG/5ML IV SOLN
INTRAVENOUS | Status: AC
  Filled 2014-08-19: qty 1

## 2014-08-19 MED ORDER — FENTANYL CITRATE 0.05 MG/ML IJ SOLN
INTRAMUSCULAR | Status: AC
  Administered 2014-08-19: 50 ug
  Filled 2014-08-19: qty 2

## 2014-08-19 MED ORDER — MEPERIDINE HCL 25 MG/ML IJ SOLN: 6.2500 mg | INTRAMUSCULAR | Status: DC | PRN

## 2014-08-19 MED ORDER — MIDAZOLAM HCL 2 MG/2ML IJ SOLN
INTRAMUSCULAR | Status: AC
  Administered 2014-08-19 (×2): 1 mg
  Filled 2014-08-19: qty 2

## 2014-08-19 MED ORDER — VITAL AF 1.2 CAL PO LIQD
1000.0000 mL | ORAL | Status: DC
  Administered 2014-08-19: 1000 mL
  Filled 2014-08-19 (×5): qty 1000

## 2014-08-19 MED ORDER — ALBUTEROL SULFATE (2.5 MG/3ML) 0.083% IN NEBU
2.5000 mg | INHALATION_SOLUTION | Freq: Once | RESPIRATORY_TRACT | Status: DC
  Filled 2014-08-19: qty 3

## 2014-08-19 MED ORDER — SODIUM CHLORIDE 0.9 % IV SOLN
INTRAVENOUS | Status: DC
  Administered 2014-08-19: 12:00:00 via INTRAVENOUS

## 2014-08-19 MED ORDER — ALBUTEROL SULFATE HFA 108 (90 BASE) MCG/ACT IN AERS
INHALATION_SPRAY | RESPIRATORY_TRACT | Status: DC | PRN
  Administered 2014-08-19 (×3): 2 via RESPIRATORY_TRACT

## 2014-08-19 MED ORDER — INSULIN ASPART 100 UNIT/ML ~~LOC~~ SOLN: 2.0000 [IU] | SUBCUTANEOUS | Status: DC

## 2014-08-19 MED ORDER — ETOMIDATE 2 MG/ML IV SOLN
INTRAVENOUS | Status: AC
Start: 2014-08-19 — End: 2014-08-19
  Filled 2014-08-19: qty 20

## 2014-08-19 MED ORDER — ARTIFICIAL TEARS OP OINT
TOPICAL_OINTMENT | OPHTHALMIC | Status: AC
  Filled 2014-08-19: qty 3.5

## 2014-08-19 MED ORDER — FENTANYL CITRATE 0.05 MG/ML IJ SOLN: 25.0000 ug | INTRAMUSCULAR | Status: DC | PRN

## 2014-08-19 MED ORDER — HEPARIN (PORCINE) IN NACL 100-0.45 UNIT/ML-% IJ SOLN
700.0000 [IU]/h | INTRAMUSCULAR | Status: DC
  Administered 2014-08-19: 700 [IU]/h via INTRAVENOUS
  Filled 2014-08-19: qty 250

## 2014-08-19 MED ORDER — LACTATED RINGERS IV SOLN
INTRAVENOUS | Status: DC | PRN
  Administered 2014-08-19: 10:00:00 via INTRAVENOUS

## 2014-08-19 MED ORDER — ALBUTEROL SULFATE (2.5 MG/3ML) 0.083% IN NEBU
INHALATION_SOLUTION | RESPIRATORY_TRACT | Status: AC
  Administered 2014-08-19: 10:00:00
  Filled 2014-08-19: qty 3

## 2014-08-19 MED ORDER — HEPARIN BOLUS VIA INFUSION
3500.0000 [IU] | Freq: Once | INTRAVENOUS | Status: AC
  Administered 2014-08-19: 3500 [IU] via INTRAVENOUS
  Filled 2014-08-19: qty 3500

## 2014-08-19 MED ORDER — SUCCINYLCHOLINE CHLORIDE 20 MG/ML IJ SOLN
INTRAMUSCULAR | Status: AC
  Filled 2014-08-19: qty 1

## 2014-08-19 MED ORDER — PROPOFOL 10 MG/ML IV BOLUS
INTRAVENOUS | Status: AC
  Filled 2014-08-19: qty 20

## 2014-08-19 MED ORDER — SODIUM CHLORIDE 0.9 % IV SOLN
0.0000 ug/h | INTRAVENOUS | Status: DC
  Administered 2014-08-19: 50 ug/h via INTRAVENOUS
  Administered 2014-08-20: 200 ug/h via INTRAVENOUS
  Filled 2014-08-19 (×2): qty 50

## 2014-08-19 MED ORDER — FENTANYL CITRATE 0.05 MG/ML IJ SOLN: 50.0000 ug | Freq: Once | INTRAMUSCULAR | Status: DC

## 2014-08-19 MED ORDER — NEOSTIGMINE METHYLSULFATE 10 MG/10ML IV SOLN
INTRAVENOUS | Status: AC
  Filled 2014-08-19: qty 1

## 2014-08-19 MED ORDER — CETYLPYRIDINIUM CHLORIDE 0.05 % MT LIQD
7.0000 mL | Freq: Four times a day (QID) | OROMUCOSAL | Status: DC
  Administered 2014-08-20 – 2014-08-26 (×12): 7 mL via OROMUCOSAL

## 2014-08-19 MED ORDER — ONDANSETRON HCL 4 MG/2ML IJ SOLN
4.0000 mg | Freq: Four times a day (QID) | INTRAMUSCULAR | Status: DC | PRN
  Administered 2014-08-22 – 2014-08-25 (×4): 4 mg via INTRAVENOUS
  Filled 2014-08-19 (×4): qty 2

## 2014-08-19 MED ORDER — HEPARIN SODIUM (PORCINE) 5000 UNIT/ML IJ SOLN
5000.0000 [IU] | Freq: Three times a day (TID) | INTRAMUSCULAR | Status: DC
  Administered 2014-08-20 – 2014-08-26 (×19): 5000 [IU] via SUBCUTANEOUS
  Filled 2014-08-19 (×23): qty 1

## 2014-08-19 MED ORDER — LABETALOL HCL 5 MG/ML IV SOLN
INTRAVENOUS | Status: AC
  Administered 2014-08-19 (×2): 5 mg via INTRAVENOUS
  Filled 2014-08-19: qty 4

## 2014-08-19 MED ORDER — PROMETHAZINE HCL 25 MG/ML IJ SOLN: 6.2500 mg | INTRAMUSCULAR | Status: DC | PRN

## 2014-08-19 MED ORDER — ASPIRIN 81 MG PO CHEW
CHEWABLE_TABLET | ORAL | Status: AC
  Filled 2014-08-19: qty 1

## 2014-08-19 MED ORDER — FENTANYL CITRATE 0.05 MG/ML IJ SOLN
INTRAMUSCULAR | Status: AC
  Filled 2014-08-19: qty 2

## 2014-08-19 MED ORDER — FENTANYL CITRATE 0.05 MG/ML IJ SOLN
INTRAMUSCULAR | Status: AC
  Filled 2014-08-19: qty 5

## 2014-08-19 MED ORDER — SODIUM CHLORIDE 0.9 % IJ SOLN: 3.0000 mL | INTRAMUSCULAR | Status: DC | PRN

## 2014-08-19 MED ORDER — ASPIRIN 81 MG PO CHEW: 81.0000 mg | CHEWABLE_TABLET | ORAL | Status: DC

## 2014-08-19 MED ORDER — MIDAZOLAM HCL 2 MG/2ML IJ SOLN
1.0000 mg | Freq: Once | INTRAMUSCULAR | Status: AC
  Administered 2014-08-19: 1 mg via INTRAVENOUS

## 2014-08-19 MED ORDER — NITROGLYCERIN 1 MG/10 ML FOR IR/CATH LAB
INTRA_ARTERIAL | Status: AC
  Filled 2014-08-19: qty 10

## 2014-08-19 MED ORDER — CHLORHEXIDINE GLUCONATE 0.12 % MT SOLN
15.0000 mL | Freq: Two times a day (BID) | OROMUCOSAL | Status: DC
  Administered 2014-08-19 – 2014-08-25 (×8): 15 mL via OROMUCOSAL
  Filled 2014-08-19 (×16): qty 15

## 2014-08-19 MED ORDER — SODIUM CHLORIDE 0.9 % IJ SOLN
3.0000 mL | Freq: Two times a day (BID) | INTRAMUSCULAR | Status: DC
  Administered 2014-08-19: 3 mL via INTRAVENOUS

## 2014-08-19 MED ORDER — FENTANYL CITRATE 0.05 MG/ML IJ SOLN
50.0000 ug | Freq: Once | INTRAMUSCULAR | Status: AC
  Administered 2014-08-19: 50 ug via INTRAVENOUS

## 2014-08-19 MED ORDER — INSULIN ASPART 100 UNIT/ML ~~LOC~~ SOLN
0.0000 [IU] | SUBCUTANEOUS | Status: DC
  Administered 2014-08-19: 3 [IU] via SUBCUTANEOUS
  Administered 2014-08-20: 5 [IU] via SUBCUTANEOUS
  Administered 2014-08-20: 2 [IU] via SUBCUTANEOUS
  Administered 2014-08-20: 1 [IU] via SUBCUTANEOUS
  Administered 2014-08-20: 2 [IU] via SUBCUTANEOUS
  Administered 2014-08-20: 3 [IU] via SUBCUTANEOUS
  Administered 2014-08-21: 2 [IU] via SUBCUTANEOUS
  Administered 2014-08-21: 5 [IU] via SUBCUTANEOUS
  Administered 2014-08-21 (×3): 3 [IU] via SUBCUTANEOUS
  Administered 2014-08-22 (×2): 7 [IU] via SUBCUTANEOUS
  Administered 2014-08-22: 2 [IU] via SUBCUTANEOUS

## 2014-08-19 MED ORDER — ACETAMINOPHEN 325 MG PO TABS
650.0000 mg | ORAL_TABLET | ORAL | Status: DC | PRN
Start: 2014-08-19 — End: 2014-08-26
  Administered 2014-08-21 – 2014-08-25 (×7): 650 mg via ORAL
  Filled 2014-08-19 (×7): qty 2

## 2014-08-19 MED ORDER — LIDOCAINE HCL (PF) 1 % IJ SOLN
INTRAMUSCULAR | Status: AC
  Filled 2014-08-19: qty 30

## 2014-08-19 MED ORDER — HYDRALAZINE HCL 20 MG/ML IJ SOLN
INTRAMUSCULAR | Status: AC
  Administered 2014-08-19 (×2): 5 mg via INTRAVENOUS
  Filled 2014-08-19: qty 1

## 2014-08-19 MED ORDER — SODIUM CHLORIDE 0.9 % IV BOLUS (SEPSIS)
1000.0000 mL | Freq: Once | INTRAVENOUS | Status: AC
  Administered 2014-08-19: 1000 mL via INTRAVENOUS

## 2014-08-19 MED ORDER — ASPIRIN 300 MG RE SUPP
300.0000 mg | RECTAL | Status: AC
  Filled 2014-08-19: qty 1

## 2014-08-19 MED ORDER — PANTOPRAZOLE SODIUM 40 MG IV SOLR
40.0000 mg | Freq: Every day | INTRAVENOUS | Status: DC
  Administered 2014-08-19 – 2014-08-21 (×3): 40 mg via INTRAVENOUS
  Filled 2014-08-19 (×4): qty 40

## 2014-08-19 MED ORDER — VITAL HIGH PROTEIN PO LIQD
1000.0000 mL | ORAL | Status: DC
  Filled 2014-08-19 (×2): qty 1000

## 2014-08-19 MED ORDER — FENTANYL BOLUS VIA INFUSION
25.0000 ug | INTRAVENOUS | Status: DC | PRN
  Administered 2014-08-19 (×5): 50 ug via INTRAVENOUS
  Filled 2014-08-19: qty 50

## 2014-08-19 MED ORDER — SODIUM CHLORIDE 0.9 % IV SOLN: INTRAVENOUS | Status: DC

## 2014-08-19 SURGICAL SUPPLY — 54 items
CHLORAPREP W/TINT 26ML (MISCELLANEOUS) ×3 IMPLANT
CLOSURE WOUND 1/2 X4 (GAUZE/BANDAGES/DRESSINGS) ×1
COVER SURGICAL LIGHT HANDLE (MISCELLANEOUS) ×6 IMPLANT
DRAPE C-ARM 42X72 X-RAY (DRAPES) ×3 IMPLANT
DRAPE INCISE IOBAN 66X45 STRL (DRAPES) ×3 IMPLANT
DRAPE PROXIMA HALF (DRAPES) ×3 IMPLANT
DRAPE SURG 17X23 STRL (DRAPES) ×3 IMPLANT
DRAPE U-SHAPE 47X51 STRL (DRAPES) ×3 IMPLANT
DRSG EMULSION OIL 3X3 NADH (GAUZE/BANDAGES/DRESSINGS) ×3 IMPLANT
DRSG MEPILEX BORDER 4X8 (GAUZE/BANDAGES/DRESSINGS) IMPLANT
DRSG PAD ABDOMINAL 8X10 ST (GAUZE/BANDAGES/DRESSINGS) ×3 IMPLANT
ELECT REM PT RETURN 9FT ADLT (ELECTROSURGICAL) ×3
ELECTRODE REM PT RTRN 9FT ADLT (ELECTROSURGICAL) ×1 IMPLANT
GAUZE SPONGE 4X4 12PLY STRL (GAUZE/BANDAGES/DRESSINGS) IMPLANT
GLOVE BIO SURGEON STRL SZ7 (GLOVE) ×3 IMPLANT
GLOVE BIO SURGEON STRL SZ7.5 (GLOVE) ×3 IMPLANT
GLOVE BIOGEL PI IND STRL 7.0 (GLOVE) ×1 IMPLANT
GLOVE BIOGEL PI IND STRL 8 (GLOVE) ×1 IMPLANT
GLOVE BIOGEL PI INDICATOR 7.0 (GLOVE) ×2
GLOVE BIOGEL PI INDICATOR 8 (GLOVE) ×2
GOWN STRL REUS W/ TWL LRG LVL3 (GOWN DISPOSABLE) ×3 IMPLANT
GOWN STRL REUS W/ TWL XL LVL3 (GOWN DISPOSABLE) ×1 IMPLANT
GOWN STRL REUS W/TWL LRG LVL3 (GOWN DISPOSABLE) ×9
GOWN STRL REUS W/TWL XL LVL3 (GOWN DISPOSABLE) ×2
KIT BASIN OR (CUSTOM PROCEDURE TRAY) ×3 IMPLANT
KIT ROOM TURNOVER OR (KITS) ×3 IMPLANT
MANIFOLD NEPTUNE II (INSTRUMENTS) ×3 IMPLANT
NDL SUT 2 .5 CRC MAYO 1.732X (NEEDLE) ×1 IMPLANT
NDL SUT 6 .5 CRC .975X.05 MAYO (NEEDLE) ×1 IMPLANT
NEEDLE 22X1 1/2 (OR ONLY) (NEEDLE) IMPLANT
NEEDLE MAYO TAPER (NEEDLE) ×6
NS IRRIG 1000ML POUR BTL (IV SOLUTION) ×3 IMPLANT
PACK SHOULDER (CUSTOM PROCEDURE TRAY) ×3 IMPLANT
PAD ARMBOARD 7.5X6 YLW CONV (MISCELLANEOUS) ×6 IMPLANT
SPONGE LAP 18X18 X RAY DECT (DISPOSABLE) ×6 IMPLANT
STAPLER VISISTAT 35W (STAPLE) ×3 IMPLANT
STRIP CLOSURE SKIN 1/2X4 (GAUZE/BANDAGES/DRESSINGS) ×2 IMPLANT
SUCTION FRAZIER TIP 10 FR DISP (SUCTIONS) ×3 IMPLANT
SUPPORT WRAP ARM LG (MISCELLANEOUS) ×3 IMPLANT
SUT BONE WAX W31G (SUTURE) IMPLANT
SUT ETHIBOND NAB CT1 #1 30IN (SUTURE) ×6 IMPLANT
SUT FIBERWIRE #2 38 T-5 BLUE (SUTURE) ×6
SUT MNCRL AB 4-0 PS2 18 (SUTURE) ×3 IMPLANT
SUT VIC AB 0 CT1 27 (SUTURE) ×3
SUT VIC AB 0 CT1 27XBRD ANBCTR (SUTURE) ×1 IMPLANT
SUT VIC AB 2-0 CT1 27 (SUTURE) ×6
SUT VIC AB 2-0 CT1 TAPERPNT 27 (SUTURE) ×2 IMPLANT
SUT VICRYL 4-0 PS2 18IN ABS (SUTURE) ×3 IMPLANT
SUTURE FIBERWR #2 38 T-5 BLUE (SUTURE) ×2 IMPLANT
SYR CONTROL 10ML LL (SYRINGE) IMPLANT
TOWEL OR 17X24 6PK STRL BLUE (TOWEL DISPOSABLE) ×3 IMPLANT
TOWEL OR 17X26 10 PK STRL BLUE (TOWEL DISPOSABLE) ×3 IMPLANT
WATER STERILE IRR 1000ML POUR (IV SOLUTION) ×3 IMPLANT
YANKAUER SUCT BULB TIP NO VENT (SUCTIONS) ×3 IMPLANT

## 2014-08-19 NOTE — Progress Notes (Signed)
27 yro female for ORIF of R proximal humerus.  IS block done with 67ml of .5% marcaine with epi.  Ultrasound and stimulator guided.  20 minutes after block pt. Became very short of breath and TIGHT CHESTED and  RX with inhalation nebulizer without improvement.  ABG ordered and Pt. Intubated with 50mg  of propofol prior to ABG results because of worsening ventilation.  Oxygen sats remained above 90 throughout.  Pt. Also given 10mg  of Hydralazine and 5mg  of Labetol for new on set HTN when having breathing problems.  CXR, cardiac enzynes, and 12 lead EKG done.  Transferred to PACU, Critical care notified and taking over management.  Dr. Tamera Punt notified and did closed reduction at bed side.   cxr WITHOUT PNEMO

## 2014-08-19 NOTE — CV Procedure (Signed)
Brandy Short is a 73 y.o. female    [id number]  [id number] LOCATION:  FACILITY: [facility]  PHYSICIAN: Troy Sine, MD, [facility] 1941-09-06   DATE OF PROCEDURE:  08/19/2014    CARDIAC CATHETERIZATION     HISTORY:    [name] is a 73 y.o. female with a history of diabetes mellitus, and hypertension, who is admitted to the hospital with fracture of her right humerus.  Apparently, prior to undergoing surgery today.  She had a respiratory arrest in the PACU and was intubated.  ECG showed diffuse ST segment depression.  She is now referred for urgent cardiac catheterization.   PROCEDURE: Left heart cardiac catheterization: Coronary angiography, left ventriculography.  The patient was brought to the [facility] cardiac catherization labaratory intubated. She was premedicated with Versed 2 mg for additional sedation. Her right groin was prepped and shaved in usual sterile fashion. Xylocaine 1% was used for local anesthesia. A 5 French sheath was inserted into the R femoral artery. Diagnostic catheterizatiion was done with 5 [place] FL4, FR4, and pigtail catheters. Left ventriculography was done with 23 cc Omnipaque contrast. Hemostasis was obtained by direct manual compression. The patient tolerated the procedure well.   HEMODYNAMICS:   Central Aorta: 90/40   Left Ventricle: 100/13  ANGIOGRAPHY:  Fluoroscopy revealed moderate coronary calcification of both the left and right coronary system.  Left main: Common ostium which immediately bifurcated into an LAD and left circumflex coronary artery  LAD: Moderate calcification without significant obstructive disease.  The vessel extended to and wrapped around the LV apex and gave rise to one major bifurcating diagonal branch  Left circumflex: The vessel gave rise to 2 marginal branches.  It was smooth 40% narrowing in the proximal third of a small caliber distal OM 2 branch  Right coronary artery: Moderate coronary calcification without  significant obstructive disease.  Left ventriculography revealed hyperdynamic LV contractility with an ejection fraction of 65-70%.  There was left ventricular hypertrophy.  There is near cavity obliteration during systole contributing to the LV to AO 10 mm gradient  Total contrast used: 55 cc Omnipaque  IMPRESSION:  Moderate coronary calcification without significant coronary artery disease with a smooth 40%  stenosis of the circumflex OM-2 vessel.  Hyperdynamic left ventricular contractility with an ejection fraction of 65-70% and evidence for left ventricular hypertrophy and near systolic cavity obliteration.  Troy Sine, MD, [facility] 08/19/2014 3:49 PM

## 2014-08-19 NOTE — Progress Notes (Signed)
Dr. Tresa Moore aware of BP this a.m.

## 2014-08-19 NOTE — Anesthesia Procedure Notes (Addendum)
Procedure Name: Intubation Date/Time: 08/19/2014 10:00 AM Performed by: Ned Grace Pre-anesthesia Checklist: Patient identified, Timeout performed, Emergency Drugs available, Suction available and Patient being monitored Patient Re-evaluated:Patient Re-evaluated prior to inductionOxygen Delivery Method: Ambu bag Preoxygenation: Pre-oxygenation with 100% oxygen Intubation Type: IV induction Ventilation: Mask ventilation without difficulty and Oral airway inserted - appropriate to patient size Laryngoscope Size: Mac and 3 Grade View: Grade I Tube type: Subglottic suction tube Tube size: 7.5 mm Number of attempts: 1 Airway Equipment and Method: Stylet Placement Confirmation: ETT inserted through vocal cords under direct vision,  CO2 detector and breath sounds checked- equal and bilateral Secured at: 21 cm Tube secured with: Tape Dental Injury: Teeth and Oropharynx as per pre-operative assessment  Comments: Intubation performed by Lara Mulch CRNA   Anesthesia Regional Block:  Interscalene brachial plexus block  Pre-Anesthetic Checklist: ,, timeout performed,, Correct Site,, Correct Procedure,, site marked,,, surgical consent,, at surgeon's request  Laterality: Right  Prep: Maximum Sterile Barrier Precautions used and chloraprep       Needles:  Injection technique: Single-shot  Needle Type: Echogenic Stimulator Needle     Needle Length: 4cm 4 cm Needle Gauge: 21 and 21 G    Additional Needles:  Procedures: ultrasound guided (picture in chart) Interscalene brachial plexus block Narrative:   Performed by: Personally   Additional Notes: R IS block done after informed consent.  Sterile prep, drapes.  US guided with nerve stimulation down to .59ma.  Total of 20cc .5% marcaine injected with multiple negative aspirations.  Good spread of anesthetic around nerves.  1mg  of versed and 5mcg of fentanyl for sedation.  Patient tolerated procedure well and remained in  communication with me.

## 2014-08-19 NOTE — Transfer of Care (Signed)
Immediate Anesthesia Transfer of Care Note  Patient: Brandy Short  Procedure(s) Performed: Procedure(s) with comments: OPEN REDUCTION INTERNAL FIXATION (ORIF) PROXIMAL HUMERUS FRACTURE (Right) - Right IM nail proximal humerus    CASE CANCELLED

## 2014-08-19 NOTE — Op Note (Signed)
The patient was given an interscalene nerve block in the holding area by the attending anesthesiologist and subsequently had respiratory failure requiring intubation in the holding area. It was felt that she had significant bronchoconstriction and a drop in her blood pressure. The anesthesiologist did not feel comfortable proceeding with surgery. She is currently being observed in the PACU. After long discussion with the anesthesiologist in the family I felt that given this event and her baseline fragility with borderline anemia, etc. Ultimately it is probably best that we try to treat this nonsurgically. Therefore in the recovery room while she was intubated and sedated with propofol I did perform a closed reduction of the fracture an attempt to improve alignment to increase the chances of successful nonoperative management for her. With gentle forward flexion and traction I was able to reduce the palpable anteriorly displaced shaft fragment under the head. She was placed in a sling immobilizer.  She'll be observed in the PACU unit and if she can be extubated and is awake alert and completely stable it is possible she could be discharged home today with her family. Otherwise she will be observed overnight.

## 2014-08-19 NOTE — Progress Notes (Signed)
Orthopedic Tech Progress Note Patient Details:  Brandy Short Nov 06, 1941 [id number]  Ortho Devices Type of Ortho Device: Arm sling Ortho Device/Splint Interventions: Application   Irish Elders 08/19/2014, 12:02 PM

## 2014-08-19 NOTE — Progress Notes (Signed)
Echocardiogram 2D Echocardiogram has been performed.  Brandy Short 08/19/2014, 12:48 PM

## 2014-08-19 NOTE — OR Nursing (Signed)
Procedure was cancelled after I assessed the patient and entered my assessment in the chart. Patient did not come to the OR from Short Stay as they were intubated in [place] and taken directly to the PACU for observation.

## 2014-08-19 NOTE — H&P (View-Only) (Signed)
CONSULT NOTE  Date: 08/19/2014               Patient Name:  Brandy Short MRN: [id number]  DOB: 04-28-1941 Age / Sex: 73 y.o., female        PCP: [facility] Primary Cardiologist: Wyvonne Lenz            Referring Physician: Tamera Punt              Reason for Consult: S/p respiratory arrest, significant ST depression           History of Present Illness: Patient is a 73 y.o. female with a PMHx of  DM, HTN  , who was admitted to [facility] on 08/19/2014 for evaluation of ORIF of the R humerus.  She had a respiratory arrest in the OR ( some sources report that that she had CP prior to respiratory arrest)   She now has deep ST depression,    Intubated, unable to give any hx. Spoke to family.  No hx of CP.  She has been slowing down - not all that acitve.    Medications: Outpatient medications: Prescriptions prior to admission  Medication Sig Dispense Refill  . acidophilus (RISAQUAD) CAPS Take 1 capsule by mouth daily.       Marland Kitchen albuterol (PROVENTIL HFA;VENTOLIN HFA) 108 (90 BASE) MCG/ACT inhaler Inhale 1 puff into the lungs 2 (two) times daily as needed for wheezing or shortness of breath.      . betamethasone dipropionate (DIPROLENE) 0.05 % cream Apply 1 application topically daily. Uses for skin cancer      . bisoprolol (ZEBETA) 10 MG tablet 5-10 mg at bedtime. Take 1/2 to 1 tablet daily as directed for BP      . brimonidine (ALPHAGAN) 0.15 % ophthalmic solution Place 1 drop into the right eye 2 (two) times daily at 10 AM and 5 PM.       . Calcium Carbonate (CALCIUM 600 PO) Take 1 tablet by mouth daily.      . clobetasol (TEMOVATE) 0.05 % external solution Apply 1 application topically 2 (two) times daily.       . Cyanocobalamin (VITAMIN B 12 PO) Take 1,000 mcg by mouth daily.      . Difluprednate (DUREZOL) 0.05 % EMUL Apply 1 drop to eye 2 (two) times daily. 1 ggt to surgical eye BID      . dorzolamide-timolol (COSOPT) 22.3-6.8 MG/ML ophthalmic solution Place 1 drop into  the left eye 2 (two) times daily.       . hydrochlorothiazide (HYDRODIURIL) 25 MG tablet Take 25 mg by mouth daily as needed (fluid retention). Takes prn      . HYDROcodone-acetaminophen (NORCO/VICODIN) 5-325 MG per tablet Take 1 tablet by mouth every 6 (six) hours as needed for moderate pain or severe pain.  15 tablet  0  . Insulin Detemir (LEVEMIR) 100 UNIT/ML Pen Inject 50 units subcutaneous daily or as directed  45 mL  11  . losartan (COZAAR) 100 MG tablet Take 1 tablet by mouth  every day  90 tablet  1  . LUMIGAN 0.01 % SOLN Place 1 drop into both eyes at bedtime.       . metFORMIN (GLUCOPHAGE-XR) 500 MG 24 hr tablet Take 1,000 mg by mouth 2 (two) times daily.      . [DISCONTINUED] bisoprolol (ZEBETA) 10 MG tablet Take 1/2 to 1 tablet daily as directed for BP  90 tablet  99  . ONE TOUCH  ULTRA TEST test strip CHECK BLOOD SUGAR EVERY DAY TO 3 TIMES A DAY  300 each  6  . Red Yeast Rice 600 MG CAPS Take 600 mg by mouth daily.          Current medications: Current Facility-Administered Medications  Medication Dose Route Frequency Provider Last Rate Last Dose  . 0.9 %  sodium chloride infusion  250 mL Intravenous PRN Rahul P Desai, PA-C      . 0.9 %  sodium chloride infusion   Intravenous Continuous Raylene Miyamoto, MD      . albuterol (PROVENTIL) (2.5 MG/3ML) 0.083% nebulizer solution 2.5 mg  2.5 mg Nebulization Q2H PRN Rahul P Desai, PA-C      . aspirin chewable tablet 324 mg  324 mg Oral NOW Rahul P Desai, PA-C       Or  . aspirin suppository 300 mg  300 mg Rectal NOW Rahul P Desai, PA-C      . etomidate (AMIDATE) 2 MG/ML injection           . feeding supplement (VITAL HIGH PROTEIN) liquid 1,000 mL  1,000 mL Per Tube Q24H Raylene Miyamoto, MD      . fentaNYL (SUBLIMAZE) 0.05 MG/ML injection           . fentaNYL (SUBLIMAZE) 2,500 mcg in sodium chloride 0.9 % 250 mL (10 mcg/mL) infusion  0-400 mcg/hr Intravenous Continuous Rahul P Desai, PA-C      . fentaNYL (SUBLIMAZE) bolus via  infusion 25-50 mcg  25-50 mcg Intravenous Q1H PRN Rahul P Desai, PA-C      . fentaNYL (SUBLIMAZE) injection 50 mcg  50 mcg Intravenous Once Rahul P Desai, PA-C      . heparin ADULT infusion 100 units/mL (25000 units/250 mL)  700 Units/hr Intravenous Continuous Dareen Piano, [facility]      . heparin bolus via infusion 3,500 Units  3,500 Units Intravenous Once Dareen Piano, [facility]      . insulin aspart (novoLOG) injection 0-9 Units  0-9 Units Subcutaneous 6 times per day Rahul P Desai, PA-C      . lidocaine (cardiac) 100 mg/48ml (XYLOCAINE) 20 MG/ML injection 2%           . midazolam (VERSED) 2 MG/2ML injection           . pantoprazole (PROTONIX) injection 40 mg  40 mg Intravenous QHS Rahul P Desai, PA-C      . rocuronium (ZEMURON) 50 MG/5ML injection           . succinylcholine (ANECTINE) 20 MG/ML injection            Facility-Administered Medications Ordered in Other Encounters  Medication Dose Route Frequency Provider Last Rate Last Dose  . bupivacaine-epinephrine (MARCAINE W/ EPI) 0.5% -1:200000 injection    Anesthesia Intra-op Alexis Frock, MD   20 mL at 08/19/14 0930     Allergies  Allergen Reactions  . Actonel [Risedronate Sodium]     Dyspepsia  . Codeine Nausea Only and Other (See Comments)    Nightmares  . Hyosol-Sl [Hyoscyamine] Other (See Comments)    Dry mouth  . Metformin And Related Diarrhea  . Phenobarbital Diarrhea  . Ace Inhibitors Rash  . Dilaudid [Hydromorphone Hcl] Rash  . Quinine Derivatives Rash  . Sulfur Rash  . Tetracyclines & Related Rash    Skin peeled off      Past Medical History  Diagnosis Date  . Depression   . Diverticulosis   . Adenomatous colon  polyp   . Asthma   . Hypertension   . Mixed hyperlipidemia   . Type II or unspecified type diabetes mellitus without mention of complication, not stated as uncontrolled   . Anemia   . GERD (gastroesophageal reflux disease)   . Vitamin D deficiency   . Glaucoma   . Diverticulitis   . Heart  murmur   . Pneumonia   . Cancer     basal cell carcinoma of the skin    Past Surgical History  Procedure Laterality Date  . Abdominal hysterectomy    . Breast biopsy    . Cyst removed from lower spine    . Lithotripsy    . Eye surgery Bilateral 09/2003 Dr. Katy Fitch     Glaucoma    Family History  Problem Relation Age of Onset  . Breast cancer Sister   . Diabetes Sister   . Heart disease Sister   . Diabetes Mother   . Hypertension Mother   . Heart disease Mother   . Hyperlipidemia Sister   . Arthritis Brother   . Diabetes Sister   . Esophageal cancer Neg Hx   . Rectal cancer Neg Hx   . Stomach cancer Neg Hx   . Colon cancer Neg Hx   . Heart disease Father   . Diabetes Brother     Social History:  reports that she quit smoking about 11 years ago. She has never used smokeless tobacco. She reports that she drinks alcohol. She reports that she does not use illicit drugs.   Review of Systems: No obtainable due to sedation and intubation / ventilation  Physical Exam: BP 107/34  Pulse 84  Temp(Src) 97.8 F (36.6 C) (Oral)  Resp 22  Wt 129 lb (58.514 kg)  SpO2 100%  Wt Readings from Last 3 Encounters:  08/19/14 129 lb (58.514 kg)  08/19/14 129 lb (58.514 kg)  08/03/14 119 lb 12.8 oz (54.341 kg)    General: Vital signs reviewed and noted. Elderly female, in vent  Head: Normocephalic, atraumatic, sclera anicteric,   Neck: Supple. Negative for carotid bruits. No JVD   Lungs:  On vent ,  Anterior chest wall has bruises   Heart: RRR with S1 S2. No murmurs, rubs, or gallops   Abdomen:  Soft, non-tender, non-distended with normoactive bowel sounds. No hepatomegaly. No rebound/guarding. No obvious abdominal masses   MSK: Strength and the appear normal for age.   Extremities: No clubbing or cyanosis. No edema.  Distal pedal pulses are 2+ and equal   Neurologic: Alert and oriented X 3. Moves all extremities spontaneously.  Psych: Responds to questions appropriately with a  normal affect.     Lab results: Basic Metabolic Panel:  Recent Labs Lab 08/19/14 0822  NA 142  K 4.3  CL 104  CO2 25  GLUCOSE 143*  BUN 25*  CREATININE 1.04  CALCIUM 9.1    Liver Function Tests: No results found for this basename: AST, ALT, ALKPHOS, BILITOT, PROT, ALBUMIN,  in the last 168 hours No results found for this basename: LIPASE, AMYLASE,  in the last 168 hours No results found for this basename: AMMONIA,  in the last 168 hours  CBC:  Recent Labs Lab 08/19/14 0822  WBC 7.8  HGB 9.1*  HCT 26.9*  MCV 97.8  PLT 331    Cardiac Enzymes: No results found for this basename: CKTOTAL, CKMB, CKMBINDEX, TROPONINI,  in the last 168 hours  BNP: No components found with this basename: POCBNP,  CBG:  Recent Labs Lab 08/19/14 0755 08/19/14 0952 08/19/14 1033  GLUCAP 131* 242* 241*    Coagulation Studies: No results found for this basename: LABPROT, INR,  in the last 72 hours   Other results:  EKG:  NSR , deep ST depression   Imaging: Dg Chest Port 1 View  08/19/2014   CLINICAL DATA:  Intubation.  EXAM: PORTABLE CHEST - 1 VIEW  COMPARISON:  01/20/2014  FINDINGS: Endotracheal tube tip lies 3.3 cm above the carina.  Stable upper lobe scarring. No lung consolidation or edema. No pleural effusion or pneumothorax.  Cardiac silhouette is normal in size. No mediastinal or hilar masses.  There is a fracture of the proximal right humerus not evident previously. There is no significant healing. This may be acute or subacute.  IMPRESSION: 1. Endotracheal tube is well positioned. 2. No acute cardiopulmonary disease. 3. Fracture of the proximal right humerus.   Electronically Signed   By: Lajean Manes M.D.   On: 08/19/2014 10:45        Assessment & Plan:  1. Respiratory arrest:    She had a respiratory arrest and now has deep ST depression   The bedeside echo shows well preserved LV function with out any specific wall motion abnormalities.   I have spoken to  the family and have recommended cath.  I've discussed risks , benefits, options .  They understand and agree to proceed.        Thayer Headings, Brooke Bonito., MD, [facility] 08/19/2014, 12:23 PM Office - [phone] Pager [phone]

## 2014-08-19 NOTE — Anesthesia Postprocedure Evaluation (Signed)
  Anesthesia Post-op Note  Patient: Brandy Short  Procedure(s) Performed: Procedure(s) with comments: OPEN REDUCTION INTERNAL FIXATION (ORIF) PROXIMAL HUMERUS FRACTURE (Right) - Right IM nail proximal humerus   CASE CANCELLED

## 2014-08-19 NOTE — Procedures (Signed)
Central Venous Catheter Insertion Procedure Note ITA FRITZSCHE [id number] [birth year]  Procedure: Insertion of Central Venous Catheter Indications: Assessment of intravascular volume, Drug and/or fluid administration and Frequent blood sampling  Procedure Details Consent: Unable to obtain consent because of altered level of consciousness. Time Out: Verified patient identification, verified procedure, site/side was marked, verified correct patient position, special equipment/implants available, medications/allergies/relevent history reviewed, required imaging and test results available.  Performed  Maximum sterile technique was used including antiseptics, cap, gloves, gown, hand hygiene, mask and sheet. Skin prep: Chlorhexidine; local anesthetic administered A antimicrobial bonded/coated triple lumen catheter was placed in the left internal jugular vein using the Seldinger technique.  Evaluation Blood flow good Complications: No apparent complications Patient did tolerate procedure well. Chest X-ray ordered to verify placement.  CXR: pending.  Procedure performed under direct ultrasound guidance for real time vessel cannulation.      Montey Hora, [place] [facility] Pgr: [phone]  or [phone]   Tolerated [place] Drop in BP  [place]. Titus Mould, MD, [place] Pgr: [place] [facility]

## 2014-08-19 NOTE — H&P (Signed)
PULMONARY / CRITICAL CARE MEDICINE   Name: Brandy Short MRN: [id number] DOB: [birth year]    ADMISSION DATE:  08/19/2014  REFERRING MD :  Tamera Punt   CHIEF COMPLAINT:  Acute respiratory failure   INITIAL PRESENTATION: 73 yo female with hx HTN, DM, presented 8/27 for scheduled ORIF R humerus in setting displaced fx.  IS block done and 15 mins later pt became very SOB, HTN and c/o chest tightness and ultimately required intubation. Did have closed reduction at bedside but not have ORIF.  PCCM called to admit.   STUDIES:  8/27 Echo>>>  SIGNIFICANT EVENTS: 8/27 - humerus manipulation with scalene block, rapid declines, ST changes, ett  HISTORY OF PRESENT ILLNESS:  73 yo female with hx HTN, DM, presented 8/27 for scheduled ORIF R humerus in setting displaced fx.  IS block done and 15 mins later pt became very SOB, HTN and c/o chest tightness and ultimately required intubation. Did not have ORIF.  PCCM called to admit.    PAST MEDICAL HISTORY :  Past Medical History  Diagnosis Date  . Depression   . Diverticulosis   . Adenomatous colon polyp   . Asthma   . Hypertension   . Mixed hyperlipidemia   . Type II or unspecified type diabetes mellitus without mention of complication, not stated as uncontrolled   . Anemia   . GERD (gastroesophageal reflux disease)   . Vitamin D deficiency   . Glaucoma   . Diverticulitis   . Heart murmur   . Pneumonia   . Cancer     basal cell carcinoma of the skin   Past Surgical History  Procedure Laterality Date  . Abdominal hysterectomy    . Breast biopsy    . Cyst removed from lower spine    . Lithotripsy    . Eye surgery Bilateral 09/2003 Dr. Katy Fitch     Glaucoma   Prior to Admission medications   Medication Sig Start Date End Date Taking? Authorizing Provider  acidophilus (RISAQUAD) CAPS Take 1 capsule by mouth daily.    Yes Historical Provider, MD  albuterol (PROVENTIL HFA;VENTOLIN HFA) 108 (90 BASE) MCG/ACT inhaler Inhale 1 puff into the lungs  2 (two) times daily as needed for wheezing or shortness of breath.   Yes Historical Provider, MD  betamethasone dipropionate (DIPROLENE) 0.05 % cream Apply 1 application topically daily. Uses for skin cancer 08/04/13  Yes Historical Provider, MD  bisoprolol (ZEBETA) 10 MG tablet 5-10 mg at bedtime. Take 1/2 to 1 tablet daily as directed for BP 05/05/14  Yes Unk Pinto, MD  brimonidine [facility]) 0.15 % ophthalmic solution Place 1 drop into the right eye 2 (two) times daily at 10 AM and 5 PM.  01/08/14  Yes Historical Provider, MD  Calcium Carbonate (CALCIUM 600 PO) Take 1 tablet by mouth daily.   Yes Historical Provider, MD  clobetasol (TEMOVATE) 0.05 % external solution Apply 1 application topically 2 (two) times daily.  08/19/13  Yes Historical Provider, MD  Cyanocobalamin (VITAMIN B 12 PO) Take 1,000 mcg by mouth daily.   Yes Historical Provider, MD  Difluprednate (DUREZOL) 0.05 % EMUL Apply 1 drop to eye 2 (two) times daily. 1 ggt to surgical eye BID   Yes Historical Provider, MD  dorzolamide-timolol (COSOPT) 22.3-6.8 MG/ML ophthalmic solution Place 1 drop into the left eye 2 (two) times daily.  08/19/13  Yes Historical Provider, MD  hydrochlorothiazide (HYDRODIURIL) 25 MG tablet Take 25 mg by mouth daily as needed (fluid retention). Takes prn  Yes Historical Provider, MD  HYDROcodone-acetaminophen (NORCO/VICODIN) 5-325 MG per tablet Take 1 tablet by mouth every 6 (six) hours as needed for moderate pain or severe pain. 08/09/14  Yes Sharyon Cable, MD  Insulin Detemir (LEVEMIR) 100 UNIT/ML Pen Inject 50 units subcutaneous daily or as directed 02/24/14  Yes Unk Pinto, MD  losartan (COZAAR) 100 MG tablet Take 1 tablet by mouth  every day   Yes Vicie Mutters, PA-C  LUMIGAN 0.01 % SOLN Place 1 drop into both eyes at bedtime.  12/16/13  Yes Historical Provider, MD  metFORMIN (GLUCOPHAGE-XR) 500 MG 24 hr tablet Take 1,000 mg by mouth 2 (two) times daily.   Yes Historical Provider, MD  ONE TOUCH  ULTRA TEST test strip CHECK BLOOD SUGAR EVERY DAY TO 3 TIMES A DAY 03/15/14   Melissa R Smith, PA-C  Red Yeast Rice 600 MG CAPS Take 600 mg by mouth daily.      Historical Provider, MD   Allergies  Allergen Reactions  . Actonel [Risedronate Sodium]     Dyspepsia  . Codeine Nausea Only and Other (See Comments)    Nightmares  . Hyosol-Sl [Hyoscyamine] Other (See Comments)    Dry mouth  . Metformin And Related Diarrhea  . Phenobarbital Diarrhea  . Ace Inhibitors Rash  . Dilaudid [Hydromorphone Hcl] Rash  . Quinine Derivatives Rash  . Sulfur Rash  . Tetracyclines & Related Rash    Skin peeled off     FAMILY HISTORY:  Family History  Problem Relation Age of Onset  . Breast cancer Sister   . Diabetes Sister   . Heart disease Sister   . Diabetes Mother   . Hypertension Mother   . Heart disease Mother   . Hyperlipidemia Sister   . Arthritis Brother   . Diabetes Sister   . Esophageal cancer Neg Hx   . Rectal cancer Neg Hx   . Stomach cancer Neg Hx   . Colon cancer Neg Hx   . Heart disease Father   . Diabetes Brother    SOCIAL HISTORY:  reports that she quit smoking about 11 years ago. She has never used smokeless tobacco. She reports that she drinks alcohol. She reports that she does not use illicit drugs.  REVIEW OF SYSTEMS:   Unable. As per HPI.   SUBJECTIVE:   VITAL SIGNS: Temp:  [97.3 F (36.3 C)-97.8 F (36.6 C)] 97.8 F (36.6 C) (08/27 1015) Pulse Rate:  [69-94] 84 (08/27 1130) Resp:  [15-23] 22 (08/27 1130) BP: (95-241)/(34-96) 107/34 mmHg (08/27 1130) SpO2:  [97 %-100 %] 100 % (08/27 1130) FiO2 (%):  [50 %-100 %] 50 % (08/27 1130) Weight:  [129 lb (58.514 kg)] 129 lb (58.514 kg) (08/27 0750) HEMODYNAMICS:   VENTILATOR SETTINGS: Vent Mode:  [-] PRVC FiO2 (%):  [50 %-100 %] 50 % Set Rate:  [14 bmp-22 bmp] 22 bmp Vt Set:  [460 mL] 460 mL PEEP:  [5 cmH20] 5 cmH20 Plateau Pressure:  [19 cmH20-21 cmH20] 19 cmH20 INTAKE / OUTPUT:  Intake/Output Summary  (Last 24 hours) at 08/19/14 1137 Last data filed at 08/19/14 1020  Gross per 24 hour  Intake    500 ml  Output      0 ml  Net    500 ml    PHYSICAL EXAMINATION: General:  Frail, chronically ill appearing female, NAD  Neuro:  On arrival to PACU pt awake, sitting up, pulling at ETT, in distress, now sedated on vent  HEENT:  Mm dry, no  JVD, ETT Cardiovascular:  s1s2 rrr Lungs:  resps even non labored on vent post sedation, few scattered rhonchi  Abdomen:  Soft, +bs  Musculoskeletal:  Warm and dry, no sig edema, RUE bruising, splint   LABS:  CBC  Recent Labs Lab 08/19/14 0822  WBC 7.8  HGB 9.1*  HCT 26.9*  PLT 331   Coag's No results found for this basename: APTT, INR,  in the last 168 hours BMET  Recent Labs Lab 08/19/14 0822  NA 142  K 4.3  CL 104  CO2 25  BUN 25*  CREATININE 1.04  GLUCOSE 143*   Electrolytes  Recent Labs Lab 08/19/14 0822  CALCIUM 9.1   Sepsis Markers No results found for this basename: LATICACIDVEN, PROCALCITON, O2SATVEN,  in the last 168 hours ABG  Recent Labs Lab 08/19/14 0950  PHART 7.106*  PCO2ART 80.3*  PO2ART 161.0*   Liver Enzymes No results found for this basename: AST, ALT, ALKPHOS, BILITOT, ALBUMIN,  in the last 168 hours Cardiac Enzymes No results found for this basename: TROPONINI, PROBNP,  in the last 168 hours Glucose  Recent Labs Lab 08/19/14 0755 08/19/14 0952 08/19/14 1033  GLUCAP 131* 242* 241*    Imaging No results found.   ASSESSMENT / PLAN:  PULMONARY OETT  8/27>>> Acute respiratory failure  Likely acute edema from MI Hx asthma  ?phrenic nerve damage post IS block v ACS - timing of resp failure suspicious  P:   Vent support , rate to 22, keep on 100%, peep 5--> abg to follow F/u CXR for ett Sniff test in future, follow NIF, VC PRN BD   CARDIOVASCULAR CVL L IJ CVL 8/27>>> ST depression  ACS, NSTEMI HTN crisis on PACU, assess for diastolic dysfxn Hypotension/shock - presume  cardiogenic  ?could block (containing epi) have been injected intravascularly somehow given her fairly rapid onset HTN, chest tightness after admin  P:  Cards to see STAT,appreciated Stat troponin  Stat echo assess regional wall motion  Ok for anticoagulation if needed per ortho  Pressors if needed to MAp goal 65 Place CVL for CVP  Hold home anti-HTN asa  RENAL Suspect chronic renal insufficiency - baseline Scr appears ~ 1.0 P:   F/u chem  cvp for volume status  GASTROINTESTINAL No active issue  P:   NPO  PPI  Consider early TF if no intervention planned  HEMATOLOGIC No active issue  P:  SQ heparin for DVT proph dc, add IV for NSTEMI F/u CBC  Await cards decisions re: anticoagulation   INFECTIOUS No active infection  P:   Trend wbc, fever curve   ENDOCRINE DM   P:   SSI  NEUROLOGIC Sedation  P:   RASS goal: -1 Daily WUA  Fentanyl gtt   ORTHO:  R humerus fx - s/p closed reduction  No further plans for operative repair - not required given risk / benefit ratio Ok for anticoagulation if needed   TODAY'S SUMMARY:  73 yo female with SOB, chest tightness after R arm nerve block.  Now intubated.  Suspect MI.  Cards to see.  Await stat labs, stat echo.  Family updated  I have personally obtained a history, examined the patient, evaluated laboratory and imaging results, formulated the assessment and plan and placed orders. CRITICAL CARE: The patient is critically ill with multiple organ systems failure and requires high complexity decision making for assessment and support, frequent evaluation and titration of therapies, application of advanced monitoring technologies and extensive interpretation of multiple  databases. Critical Care Time devoted to patient care services described in this note is 85 minutes.    Nickolas Madrid, NP 08/19/2014  11:37 AM Pager: [phone] or [phone]  *Care during the described time interval was provided by me and/or  other providers on the critical care team. I have reviewed this patient's available data, including medical history, events of note, physical examination and test results as part of my evaluation.  Lavon Paganini. Titus Mould, MD, [place] Pgr: [place] [facility]

## 2014-08-19 NOTE — Interval H&P Note (Signed)
Cath Lab Visit (complete for each Cath Lab visit)  Clinical Evaluation Leading to the Procedure:   ACS: Yes.    Non-ACS:    Anginal Classification: CCS IV  Anti-ischemic medical therapy: No Therapy  Non-Invasive Test Results: No non-invasive testing performed  Prior CABG: No previous CABG      History and Physical Interval Note:  08/19/2014 3:01 PM  Brandy Short  has presented today for surgery, with the diagnosis of ST Changes  The various methods of treatment have been discussed with the patient and family. After consideration of risks, benefits and other options for treatment, the patient has consented to  Procedure(s): LEFT HEART CATHETERIZATION WITH CORONARY ANGIOGRAM (N/A) as a surgical intervention .  The patient's history has been reviewed, patient examined, no change in status, stable for surgery.  I have reviewed the patient's chart and labs.  Questions were answered to the patient's satisfaction.     Jah Alarid A

## 2014-08-19 NOTE — Progress Notes (Addendum)
INITIAL NUTRITION ASSESSMENT  DOCUMENTATION CODES Per approved criteria  -Not Applicable   INTERVENTION: Initiate Vital AF 1.2 @ 15 ml/hr via OG tube and increase by 10 ml every 4 hours to goal rate of 45 ml/hr.   Tube feeding regimen provides 1296 kcal (104% of needs), 81 grams of protein, and 875 ml of H2O.   NUTRITION DIAGNOSIS: Inadequate oral intake related to inability to eat as evidenced by NPO status  Goal: Pt to meet >/= 90% of their estimated nutrition needs   Monitor:  Respiratory status, weight trend, labs  Reason for Assessment: Consult received to initiate and manage enteral nutrition support.  73 y.o. female  Admitting Dx: Right humerus displaced fx for scheduled ORIF  ASSESSMENT: Pt admitted for ORIF of right humerus fx. Pt had respiratory arrest and was intubated. Pt did have closed reduction of fx but did not have ORIF.   Patient is currently intubated on ventilator support MV: 10.1 L/min Temp (24hrs), Avg:97.6 F (36.4 C), Min:97.3 F (36.3 C), Max:97.8 F (36.6 C)  Propofol: none  Unable to complete nutrition-focused physical exam at this time. Nursing staff and RTs working with pt currently.   Labs reviewed: Magnesium low 1.4 Phosphorus WNL CBG's 143-242  Height: Ht Readings from Last 1 Encounters:  08/03/14 5\' 5"  (1.651 m)    Weight: Wt Readings from Last 1 Encounters:  08/19/14 123 lb 10.9 oz (56.1 kg)    Ideal Body Weight: 56.8 kg   % Ideal Body Weight: 99%  Wt Readings from Last 10 Encounters:  08/19/14 123 lb 10.9 oz (56.1 kg)  08/19/14 123 lb 10.9 oz (56.1 kg)  08/19/14 123 lb 10.9 oz (56.1 kg)  08/03/14 119 lb 12.8 oz (54.341 kg)  05/28/14 116 lb (52.617 kg)  04/27/14 112 lb 6.4 oz (50.984 kg)  04/02/14 111 lb 12.8 oz (50.712 kg)  03/09/14 110 lb (49.896 kg)  02/24/14 111 lb 3.2 oz (50.44 kg)  01/20/14 118 lb 3.2 oz (53.615 kg)    Usual Body Weight: 110-119 lb   % Usual Body Weight: > 100%  BMI:  Body mass index  is 20.58 kg/(m^2).  Estimated Nutritional Needs: Kcal: 1242 Protein: 75-90 grams Fluid: > 1.5 L/day  Skin: ecchymosis  Diet Order: NPO  EDUCATION NEEDS: -No education needs identified at this time   Intake/Output Summary (Last 24 hours) at 08/19/14 1605 Last data filed at 08/19/14 1500  Gross per 24 hour  Intake 1051.02 ml  Output    380 ml  Net 671.02 ml    Last BM: PTA   Labs:   Recent Labs Lab 08/19/14 0822 08/19/14 1117  NA 142 141  K 4.3 4.1  CL 104 105  CO2 25 22  BUN 25* 24*  CREATININE 1.04 0.96  CALCIUM 9.1 7.9*  MG  --  1.4*  PHOS  --  4.1  GLUCOSE 143* 215*    CBG (last 3)   Recent Labs  08/19/14 0952 08/19/14 1033 08/19/14 1243  GLUCAP 242* 241* 208*    Scheduled Meds: . [START ON 08/20/2014] aspirin  81 mg Oral Pre-Cath  . etomidate      . feeding supplement (VITAL HIGH PROTEIN)  1,000 mL Per Tube Q24H  . fentaNYL      . fentaNYL  50 mcg Intravenous Once  . insulin aspart  0-9 Units Subcutaneous 6 times per day  . lidocaine (cardiac) 100 mg/38ml      . midazolam      . pantoprazole (PROTONIX)  IV  40 mg Intravenous QHS  . rocuronium      . sodium chloride  3 mL Intravenous Q12H  . succinylcholine        Continuous Infusions: . sodium chloride 100 mL/hr at 08/19/14 1224  . [START ON 08/20/2014] sodium chloride    . fentaNYL infusion INTRAVENOUS 150 mcg/hr (08/19/14 1500)  . heparin Stopped (08/19/14 1500)    Past Medical History  Diagnosis Date  . Depression   . Diverticulosis   . Adenomatous colon polyp   . Asthma   . Hypertension   . Mixed hyperlipidemia   . Type II or unspecified type diabetes mellitus without mention of complication, not stated as uncontrolled   . Anemia   . GERD (gastroesophageal reflux disease)   . Vitamin D deficiency   . Glaucoma   . Diverticulitis   . Heart murmur   . Pneumonia   . Cancer     basal cell carcinoma of the skin    Past Surgical History  Procedure Laterality Date  .  Abdominal hysterectomy    . Breast biopsy    . Cyst removed from lower spine    . Lithotripsy    . Eye surgery Bilateral 09/2003 Dr. Katy Fitch     Glaucoma    Maylon Peppers RD, [place], [place] Pager [phone] After Hours Pager

## 2014-08-19 NOTE — Progress Notes (Signed)
eLink Physician-Brief Progress Note Patient Name: Brandy Short DOB: 07/17/1941 MRN: [id number]   Date of Service  08/19/2014  HPI/Events of Note  Moderate agitation on the vent, not responding to fentanyl gtt.  She was synchronous on the vent with no desats, but uncomfortable ETT  eICU Interventions  Start on precedex     Intervention Category Intermediate Interventions: Other:  Kentley Cedillo 08/19/2014, 10:09 PM

## 2014-08-19 NOTE — Progress Notes (Signed)
Utilization Review Completed.Donne Anon T8/27/2015

## 2014-08-19 NOTE — Progress Notes (Signed)
Foley huddle complete; foley needed for newly intubated unstable pt.  Foley hudle complete with myself, Lillia Abed. Tamala Julian and Marian Sorrow.  Peri care done prior to sterile insertion of foley.  Foley care with Mcare wipes done afterwards. Foley secured by stat lock and foley bag labeled.

## 2014-08-19 NOTE — Progress Notes (Signed)
Pt transported from PACU to 2H-6. No complications noted. Report given to RT.

## 2014-08-19 NOTE — Progress Notes (Signed)
ANTICOAGULATION CONSULT NOTE - Initial Consult  Pharmacy Consult for heparin Indication: chest pain/ACS  Allergies  Allergen Reactions  . Actonel [Risedronate Sodium]     Dyspepsia  . Codeine Nausea Only and Other (See Comments)    Nightmares  . Hyosol-Sl [Hyoscyamine] Other (See Comments)    Dry mouth  . Metformin And Related Diarrhea  . Phenobarbital Diarrhea  . Ace Inhibitors Rash  . Dilaudid [Hydromorphone Hcl] Rash  . Quinine Derivatives Rash  . Sulfur Rash  . Tetracyclines & Related Rash    Skin peeled off     Patient Measurements: Weight: 129 lb (58.514 kg)  Vital Signs: Temp: 97.8 F (36.6 C) (08/27 1015) Temp src: Oral (08/27 0750) BP: 107/34 mmHg (08/27 1130) Pulse Rate: 84 (08/27 1130)  Labs:  Recent Labs  08/19/14 0822  HGB 9.1*  HCT 26.9*  PLT 331  CREATININE 1.04    The CrCl is unknown because both a height and weight (above a minimum accepted value) are required for this calculation.   Medical History: Past Medical History  Diagnosis Date  . Depression   . Diverticulosis   . Adenomatous colon polyp   . Asthma   . Hypertension   . Mixed hyperlipidemia   . Type II or unspecified type diabetes mellitus without mention of complication, not stated as uncontrolled   . Anemia   . GERD (gastroesophageal reflux disease)   . Vitamin D deficiency   . Glaucoma   . Diverticulitis   . Heart murmur   . Pneumonia   . Cancer     basal cell carcinoma of the skin    Medications:  Prescriptions prior to admission  Medication Sig Dispense Refill  . acidophilus (RISAQUAD) CAPS Take 1 capsule by mouth daily.       Marland Kitchen albuterol (PROVENTIL HFA;VENTOLIN HFA) 108 (90 BASE) MCG/ACT inhaler Inhale 1 puff into the lungs 2 (two) times daily as needed for wheezing or shortness of breath.      . betamethasone dipropionate (DIPROLENE) 0.05 % cream Apply 1 application topically daily. Uses for skin cancer      . bisoprolol (ZEBETA) 10 MG tablet 5-10 mg at bedtime.  Take 1/2 to 1 tablet daily as directed for BP      . brimonidine (ALPHAGAN) 0.15 % ophthalmic solution Place 1 drop into the right eye 2 (two) times daily at 10 AM and 5 PM.       . Calcium Carbonate (CALCIUM 600 PO) Take 1 tablet by mouth daily.      . clobetasol (TEMOVATE) 0.05 % external solution Apply 1 application topically 2 (two) times daily.       . Cyanocobalamin (VITAMIN B 12 PO) Take 1,000 mcg by mouth daily.      . Difluprednate (DUREZOL) 0.05 % EMUL Apply 1 drop to eye 2 (two) times daily. 1 ggt to surgical eye BID      . dorzolamide-timolol (COSOPT) 22.3-6.8 MG/ML ophthalmic solution Place 1 drop into the left eye 2 (two) times daily.       . hydrochlorothiazide (HYDRODIURIL) 25 MG tablet Take 25 mg by mouth daily as needed (fluid retention). Takes prn      . HYDROcodone-acetaminophen (NORCO/VICODIN) 5-325 MG per tablet Take 1 tablet by mouth every 6 (six) hours as needed for moderate pain or severe pain.  15 tablet  0  . Insulin Detemir (LEVEMIR) 100 UNIT/ML Pen Inject 50 units subcutaneous daily or as directed  45 mL  11  . losartan (COZAAR)  100 MG tablet Take 1 tablet by mouth  every day  90 tablet  1  . LUMIGAN 0.01 % SOLN Place 1 drop into both eyes at bedtime.       . metFORMIN (GLUCOPHAGE-XR) 500 MG 24 hr tablet Take 1,000 mg by mouth 2 (two) times daily.      . [DISCONTINUED] bisoprolol (ZEBETA) 10 MG tablet Take 1/2 to 1 tablet daily as directed for BP  90 tablet  99  . ONE TOUCH ULTRA TEST test strip CHECK BLOOD SUGAR EVERY DAY TO 3 TIMES A DAY  300 each  6  . Red Yeast Rice 600 MG CAPS Take 600 mg by mouth daily.         Scheduled:  . aspirin  324 mg Oral NOW   Or  . aspirin  300 mg Rectal NOW  . etomidate      . fentaNYL      . fentaNYL      . fentaNYL  50 mcg Intravenous Once  . insulin aspart  0-9 Units Subcutaneous 6 times per day  . lidocaine (cardiac) 100 mg/21ml      . midazolam      . midazolam      . pantoprazole (PROTONIX) IV  40 mg Intravenous QHS  .  rocuronium      . succinylcholine        Assessment: 73 yo female s/p fall with R  proximal humerus fracture. She went to OR this am and was noted with SOB and CP after nerve block was given. Pharmacy has been consulted to dose heparin for possible ACS. Hg/Hct= 9.1/26.9, plt= 331. Heparin sq has been ordered but none has been given  Goal of Therapy:  Heparin level 0.3-0.7 units/ml Monitor platelets by anticoagulation protocol: Yes   Plan:  -D/c sq heparin -Heparin bolus 3500 units IV followed by 700 units/hr (~ 12 units/kg/hr) -Heparin level in 8 hours and daily wth CBC daily  Hildred Laser, Pharm D 08/19/2014 12:13 PM

## 2014-08-19 NOTE — Anesthesia Preprocedure Evaluation (Addendum)
Anesthesia Evaluation   Patient awake    Reviewed: Allergy & Precautions, H&P , NPO status , Patient's Chart, lab work & pertinent test results  Airway Mallampati: II TM Distance: >3 FB     Dental  (+) Edentulous Upper, Edentulous Lower, Upper Dentures, Lower Dentures   Pulmonary former smoker (quit 2004),  breath sounds clear to auscultation        Cardiovascular hypertension, Pt. on medications Rhythm:Regular Rate:Normal  Chest CT,  calcified coronary arterys   Neuro/Psych    GI/Hepatic GERD-  ,  Endo/Other  diabetes, Poorly Controlled, Type 2, Insulin Dependent  Renal/GU      Musculoskeletal   Abdominal (+)  Abdomen: soft.    Peds  Hematology   Anesthesia Other Findings   Reproductive/Obstetrics                          Anesthesia Physical Anesthesia Plan  ASA: III  Anesthesia Plan: General   Post-op Pain Management: MAC Combined w/ Regional for Post-op pain   Induction: Intravenous  Airway Management Planned: Oral ETT  Additional Equipment:   Intra-op Plan:   Post-operative Plan: Extubation in OR  Informed Consent: I have reviewed the patients History and Physical, chart, labs and discussed the procedure including the risks, benefits and alternatives for the proposed anesthesia with the patient or authorized representative who has indicated his/her understanding and acceptance.     Plan Discussed with:   Anesthesia Plan Comments:         Anesthesia Quick Evaluation

## 2014-08-19 NOTE — Progress Notes (Signed)
RT performed NIF and vital capacity with pt. Pt. Performed -40 on NIF and .6L on the vital capacity.

## 2014-08-19 NOTE — Progress Notes (Signed)
Dr Radford Pax made aware about the pos.troponin result.

## 2014-08-19 NOTE — H&P (Signed)
Brandy Short is an 73 y.o. female.   Chief Complaint: R shoulder pain. HPI: S/p fall with R proximal humerus fracture.  Displaced.  Past Medical History  Diagnosis Date  . Depression   . Diverticulosis   . Adenomatous colon polyp   . Asthma   . Hypertension   . Mixed hyperlipidemia   . Type II or unspecified type diabetes mellitus without mention of complication, not stated as uncontrolled   . Anemia   . GERD (gastroesophageal reflux disease)   . Vitamin D deficiency   . Glaucoma   . Diverticulitis   . Heart murmur   . Pneumonia   . Cancer     basal cell carcinoma of the skin    Past Surgical History  Procedure Laterality Date  . Abdominal hysterectomy    . Breast biopsy    . Cyst removed from lower spine    . Lithotripsy    . Eye surgery Bilateral 09/2003 Dr. Katy Fitch     Glaucoma    Family History  Problem Relation Age of Onset  . Breast cancer Sister   . Diabetes Sister   . Heart disease Sister   . Diabetes Mother   . Hypertension Mother   . Heart disease Mother   . Hyperlipidemia Sister   . Arthritis Brother   . Diabetes Sister   . Esophageal cancer Neg Hx   . Rectal cancer Neg Hx   . Stomach cancer Neg Hx   . Colon cancer Neg Hx   . Heart disease Father   . Diabetes Brother    Social History:  reports that she quit smoking about 11 years ago. She has never used smokeless tobacco. She reports that she drinks alcohol. She reports that she does not use illicit drugs.  Allergies:  Allergies  Allergen Reactions  . Actonel [Risedronate Sodium]     Dyspepsia  . Codeine Nausea Only and Other (See Comments)    Nightmares  . Hyosol-Sl [Hyoscyamine] Other (See Comments)    Dry mouth  . Metformin And Related Diarrhea  . Phenobarbital Diarrhea  . Ace Inhibitors Rash  . Dilaudid [Hydromorphone Hcl] Rash  . Quinine Derivatives Rash  . Sulfur Rash  . Tetracyclines & Related Rash    Skin peeled off     Medications Prior to Admission  Medication Sig Dispense  Refill  . acidophilus (RISAQUAD) CAPS Take 1 capsule by mouth daily.       Marland Kitchen albuterol (PROVENTIL HFA;VENTOLIN HFA) 108 (90 BASE) MCG/ACT inhaler Inhale 1 puff into the lungs 2 (two) times daily as needed for wheezing or shortness of breath.      . betamethasone dipropionate (DIPROLENE) 0.05 % cream Apply 1 application topically daily. Uses for skin cancer      . bisoprolol (ZEBETA) 10 MG tablet 5-10 mg at bedtime. Take 1/2 to 1 tablet daily as directed for BP      . brimonidine (ALPHAGAN) 0.15 % ophthalmic solution Place 1 drop into the right eye 2 (two) times daily at 10 AM and 5 PM.       . Calcium Carbonate (CALCIUM 600 PO) Take 1 tablet by mouth daily.      . clobetasol (TEMOVATE) 0.05 % external solution Apply 1 application topically 2 (two) times daily.       . Cyanocobalamin (VITAMIN B 12 PO) Take 1,000 mcg by mouth daily.      . Difluprednate (DUREZOL) 0.05 % EMUL Apply 1 drop to eye 2 (two) times daily.  1 ggt to surgical eye BID      . dorzolamide-timolol (COSOPT) 22.3-6.8 MG/ML ophthalmic solution Place 1 drop into the left eye 2 (two) times daily.       . hydrochlorothiazide (HYDRODIURIL) 25 MG tablet Take 25 mg by mouth daily as needed (fluid retention). Takes prn      . HYDROcodone-acetaminophen (NORCO/VICODIN) 5-325 MG per tablet Take 1 tablet by mouth every 6 (six) hours as needed for moderate pain or severe pain.  15 tablet  0  . Insulin Detemir (LEVEMIR) 100 UNIT/ML Pen Inject 50 units subcutaneous daily or as directed  45 mL  11  . losartan (COZAAR) 100 MG tablet Take 1 tablet by mouth  every day  90 tablet  1  . LUMIGAN 0.01 % SOLN Place 1 drop into both eyes at bedtime.       . metFORMIN (GLUCOPHAGE-XR) 500 MG 24 hr tablet Take 1,000 mg by mouth 2 (two) times daily.      . [DISCONTINUED] bisoprolol (ZEBETA) 10 MG tablet Take 1/2 to 1 tablet daily as directed for BP  90 tablet  99  . ONE TOUCH ULTRA TEST test strip CHECK BLOOD SUGAR EVERY DAY TO 3 TIMES A DAY  300 each  6  .  Red Yeast Rice 600 MG CAPS Take 600 mg by mouth daily.          Results for orders placed during the hospital encounter of 08/19/14 (from the past 48 hour(s))  GLUCOSE, CAPILLARY     Status: Abnormal   Collection Time    08/19/14  7:55 AM      Result Value Ref Range   Glucose-Capillary 131 (*) 70 - 99 mg/dL  CBC     Status: Abnormal   Collection Time    08/19/14  8:22 AM      Result Value Ref Range   WBC 7.8  4.0 - 10.5 K/uL   RBC 2.75 (*) 3.87 - 5.11 MIL/uL   Hemoglobin 9.1 (*) 12.0 - 15.0 g/dL   HCT 26.9 (*) 36.0 - 46.0 %   MCV 97.8  78.0 - 100.0 fL   MCH 33.1  26.0 - 34.0 pg   MCHC 33.8  30.0 - 36.0 g/dL   RDW 13.2  11.5 - 15.5 %   Platelets 331  150 - 400 K/uL   No results found.  Review of Systems  All other systems reviewed and are negative.   Blood pressure 214/60, pulse 71, temperature 97.3 F (36.3 C), temperature source Oral, resp. rate 18, weight 58.514 kg (129 lb), SpO2 97.00%. Physical Exam  Constitutional: She is oriented to person, place, and time. She appears well-developed and well-nourished.  HENT:  Head: Atraumatic.  Eyes: EOM are normal.  Cardiovascular: Intact distal pulses.   Respiratory: Effort normal.  Musculoskeletal:  R shoulder pain with ROM, prominent bone anteriorly.  NVID.  Neurological: She is alert and oriented to person, place, and time.  Skin: Skin is warm and dry.  Psychiatric: She has a normal mood and affect.     Assessment/Plan R proximal humerus fracture, displaced Plan IMN vs ORIF. Risks / benefits of surgery discussed Consent on chart  NPO for OR Preop antibiotics   Rehaan Viloria WILLIAM 08/19/2014, 9:10 AM

## 2014-08-19 NOTE — Progress Notes (Signed)
Dr Titus Mould here at bedside. Pt to [place] intubated, on vent with RT present. Ortho tech to place sling to RUE per Dr Tamera Punt. Dr Titus Mould said he would update Dr Tamera Punt and pt's family.

## 2014-08-19 NOTE — Consult Note (Signed)
CONSULT NOTE  Date: 08/19/2014               Patient Name:  Brandy Short MRN: [id number]  DOB: 12-Dec-1941 Age / Sex: 73 y.o., female        PCP: [place] Primary Cardiologist: Wyvonne Lenz            Referring Physician: Tamera Punt              Reason for Consult: S/p respiratory arrest, significant ST depression           History of Present Illness: Patient is a 73 y.o. female with a PMHx of  DM, HTN  , who was admitted to [facility] on 08/19/2014 for evaluation of ORIF of the R humerus.  She had a respiratory arrest in the OR ( some sources report that that she had CP prior to respiratory arrest)   She now has deep ST depression,    Intubated, unable to give any hx. Spoke to family.  No hx of CP.  She has been slowing down - not all that acitve.    Medications: Outpatient medications: Prescriptions prior to admission  Medication Sig Dispense Refill  . acidophilus (RISAQUAD) CAPS Take 1 capsule by mouth daily.       Marland Kitchen albuterol (PROVENTIL HFA;VENTOLIN HFA) 108 (90 BASE) MCG/ACT inhaler Inhale 1 puff into the lungs 2 (two) times daily as needed for wheezing or shortness of breath.      . betamethasone dipropionate (DIPROLENE) 0.05 % cream Apply 1 application topically daily. Uses for skin cancer      . bisoprolol (ZEBETA) 10 MG tablet 5-10 mg at bedtime. Take 1/2 to 1 tablet daily as directed for BP      . brimonidine (ALPHAGAN) 0.15 % ophthalmic solution Place 1 drop into the right eye 2 (two) times daily at 10 AM and 5 PM.       . Calcium Carbonate (CALCIUM 600 PO) Take 1 tablet by mouth daily.      . clobetasol (TEMOVATE) 0.05 % external solution Apply 1 application topically 2 (two) times daily.       . Cyanocobalamin (VITAMIN B 12 PO) Take 1,000 mcg by mouth daily.      . Difluprednate (DUREZOL) 0.05 % EMUL Apply 1 drop to eye 2 (two) times daily. 1 ggt to surgical eye BID      . dorzolamide-timolol (COSOPT) 22.3-6.8 MG/ML ophthalmic solution Place 1 drop into  the left eye 2 (two) times daily.       . hydrochlorothiazide (HYDRODIURIL) 25 MG tablet Take 25 mg by mouth daily as needed (fluid retention). Takes prn      . HYDROcodone-acetaminophen (NORCO/VICODIN) 5-325 MG per tablet Take 1 tablet by mouth every 6 (six) hours as needed for moderate pain or severe pain.  15 tablet  0  . Insulin Detemir (LEVEMIR) 100 UNIT/ML Pen Inject 50 units subcutaneous daily or as directed  45 mL  11  . losartan (COZAAR) 100 MG tablet Take 1 tablet by mouth  every day  90 tablet  1  . LUMIGAN 0.01 % SOLN Place 1 drop into both eyes at bedtime.       . metFORMIN (GLUCOPHAGE-XR) 500 MG 24 hr tablet Take 1,000 mg by mouth 2 (two) times daily.      . [DISCONTINUED] bisoprolol (ZEBETA) 10 MG tablet Take 1/2 to 1 tablet daily as directed for BP  90 tablet  99  . ONE TOUCH  ULTRA TEST test strip CHECK BLOOD SUGAR EVERY DAY TO 3 TIMES A DAY  300 each  6  . Red Yeast Rice 600 MG CAPS Take 600 mg by mouth daily.          Current medications: Current Facility-Administered Medications  Medication Dose Route Frequency Provider Last Rate Last Dose  . 0.9 %  sodium chloride infusion  250 mL Intravenous PRN Rahul P Desai, PA-C      . 0.9 %  sodium chloride infusion   Intravenous Continuous Raylene Miyamoto, MD      . albuterol (PROVENTIL) (2.5 MG/3ML) 0.083% nebulizer solution 2.5 mg  2.5 mg Nebulization Q2H PRN Rahul P Desai, PA-C      . aspirin chewable tablet 324 mg  324 mg Oral NOW Rahul P Desai, PA-C       Or  . aspirin suppository 300 mg  300 mg Rectal NOW Rahul P Desai, PA-C      . etomidate (AMIDATE) 2 MG/ML injection           . feeding supplement (VITAL HIGH PROTEIN) liquid 1,000 mL  1,000 mL Per Tube Q24H Raylene Miyamoto, MD      . fentaNYL (SUBLIMAZE) 0.05 MG/ML injection           . fentaNYL (SUBLIMAZE) 2,500 mcg in sodium chloride 0.9 % 250 mL (10 mcg/mL) infusion  0-400 mcg/hr Intravenous Continuous Rahul P Desai, PA-C      . fentaNYL (SUBLIMAZE) bolus via  infusion 25-50 mcg  25-50 mcg Intravenous Q1H PRN Rahul P Desai, PA-C      . fentaNYL (SUBLIMAZE) injection 50 mcg  50 mcg Intravenous Once Rahul P Desai, PA-C      . heparin ADULT infusion 100 units/mL (25000 units/250 mL)  700 Units/hr Intravenous Continuous Dareen Piano, [facility]      . heparin bolus via infusion 3,500 Units  3,500 Units Intravenous Once Dareen Piano, [facility]      . insulin aspart (novoLOG) injection 0-9 Units  0-9 Units Subcutaneous 6 times per day Rahul P Desai, PA-C      . lidocaine (cardiac) 100 mg/43ml (XYLOCAINE) 20 MG/ML injection 2%           . midazolam (VERSED) 2 MG/2ML injection           . pantoprazole (PROTONIX) injection 40 mg  40 mg Intravenous QHS Rahul P Desai, PA-C      . rocuronium (ZEMURON) 50 MG/5ML injection           . succinylcholine (ANECTINE) 20 MG/ML injection            Facility-Administered Medications Ordered in Other Encounters  Medication Dose Route Frequency Provider Last Rate Last Dose  . bupivacaine-epinephrine (MARCAINE W/ EPI) 0.5% -1:200000 injection    Anesthesia Intra-op Alexis Frock, MD   20 mL at 08/19/14 0930     Allergies  Allergen Reactions  . Actonel [Risedronate Sodium]     Dyspepsia  . Codeine Nausea Only and Other (See Comments)    Nightmares  . Hyosol-Sl [Hyoscyamine] Other (See Comments)    Dry mouth  . Metformin And Related Diarrhea  . Phenobarbital Diarrhea  . Ace Inhibitors Rash  . Dilaudid [Hydromorphone Hcl] Rash  . Quinine Derivatives Rash  . Sulfur Rash  . Tetracyclines & Related Rash    Skin peeled off      Past Medical History  Diagnosis Date  . Depression   . Diverticulosis   . Adenomatous colon  polyp   . Asthma   . Hypertension   . Mixed hyperlipidemia   . Type II or unspecified type diabetes mellitus without mention of complication, not stated as uncontrolled   . Anemia   . GERD (gastroesophageal reflux disease)   . Vitamin D deficiency   . Glaucoma   . Diverticulitis   . Heart  murmur   . Pneumonia   . Cancer     basal cell carcinoma of the skin    Past Surgical History  Procedure Laterality Date  . Abdominal hysterectomy    . Breast biopsy    . Cyst removed from lower spine    . Lithotripsy    . Eye surgery Bilateral 09/2003 Dr. Katy Fitch     Glaucoma    Family History  Problem Relation Age of Onset  . Breast cancer Sister   . Diabetes Sister   . Heart disease Sister   . Diabetes Mother   . Hypertension Mother   . Heart disease Mother   . Hyperlipidemia Sister   . Arthritis Brother   . Diabetes Sister   . Esophageal cancer Neg Hx   . Rectal cancer Neg Hx   . Stomach cancer Neg Hx   . Colon cancer Neg Hx   . Heart disease Father   . Diabetes Brother     Social History:  reports that she quit smoking about 11 years ago. She has never used smokeless tobacco. She reports that she drinks alcohol. She reports that she does not use illicit drugs.   Review of Systems: No obtainable due to sedation and intubation / ventilation  Physical Exam: BP 107/34  Pulse 84  Temp(Src) 97.8 F (36.6 C) (Oral)  Resp 22  Wt 129 lb (58.514 kg)  SpO2 100%  Wt Readings from Last 3 Encounters:  08/19/14 129 lb (58.514 kg)  08/19/14 129 lb (58.514 kg)  08/03/14 119 lb 12.8 oz (54.341 kg)    General: Vital signs reviewed and noted. Elderly female, in vent  Head: Normocephalic, atraumatic, sclera anicteric,   Neck: Supple. Negative for carotid bruits. No JVD   Lungs:  On vent ,  Anterior chest wall has bruises   Heart: RRR with S1 S2. No murmurs, rubs, or gallops   Abdomen:  Soft, non-tender, non-distended with normoactive bowel sounds. No hepatomegaly. No rebound/guarding. No obvious abdominal masses   MSK: Strength and the appear normal for age.   Extremities: No clubbing or cyanosis. No edema.  Distal pedal pulses are 2+ and equal   Neurologic: Alert and oriented X 3. Moves all extremities spontaneously.  Psych: Responds to questions appropriately with a  normal affect.     Lab results: Basic Metabolic Panel:  Recent Labs Lab 08/19/14 0822  NA 142  K 4.3  CL 104  CO2 25  GLUCOSE 143*  BUN 25*  CREATININE 1.04  CALCIUM 9.1    Liver Function Tests: No results found for this basename: AST, ALT, ALKPHOS, BILITOT, PROT, ALBUMIN,  in the last 168 hours No results found for this basename: LIPASE, AMYLASE,  in the last 168 hours No results found for this basename: AMMONIA,  in the last 168 hours  CBC:  Recent Labs Lab 08/19/14 0822  WBC 7.8  HGB 9.1*  HCT 26.9*  MCV 97.8  PLT 331    Cardiac Enzymes: No results found for this basename: CKTOTAL, CKMB, CKMBINDEX, TROPONINI,  in the last 168 hours  BNP: No components found with this basename: POCBNP,  CBG:  Recent Labs Lab 08/19/14 0755 08/19/14 0952 08/19/14 1033  GLUCAP 131* 242* 241*    Coagulation Studies: No results found for this basename: LABPROT, INR,  in the last 72 hours   Other results:  EKG:  NSR , deep ST depression   Imaging: Dg Chest Port 1 View  08/19/2014   CLINICAL DATA:  Intubation.  EXAM: PORTABLE CHEST - 1 VIEW  COMPARISON:  01/20/2014  FINDINGS: Endotracheal tube tip lies 3.3 cm above the carina.  Stable upper lobe scarring. No lung consolidation or edema. No pleural effusion or pneumothorax.  Cardiac silhouette is normal in size. No mediastinal or hilar masses.  There is a fracture of the proximal right humerus not evident previously. There is no significant healing. This may be acute or subacute.  IMPRESSION: 1. Endotracheal tube is well positioned. 2. No acute cardiopulmonary disease. 3. Fracture of the proximal right humerus.   Electronically Signed   By: Lajean Manes M.D.   On: 08/19/2014 10:45        Assessment & Plan:  1. Respiratory arrest:    She had a respiratory arrest and now has deep ST depression   The bedeside echo shows well preserved LV function with out any specific wall motion abnormalities.   I have spoken to  the family and have recommended cath.  I've discussed risks , benefits, options .  They understand and agree to proceed.        Thayer Headings, Brooke Bonito., MD, [facility] 08/19/2014, 12:23 PM Office - [phone] Pager [phone]

## 2014-08-20 ENCOUNTER — Inpatient Hospital Stay (HOSPITAL_COMMUNITY): Payer: Medicare Other

## 2014-08-20 DIAGNOSIS — E1029 Type 1 diabetes mellitus with other diabetic kidney complication: Secondary | ICD-10-CM

## 2014-08-20 DIAGNOSIS — R9431 Abnormal electrocardiogram [ECG] [EKG]: Secondary | ICD-10-CM | POA: Diagnosis present

## 2014-08-20 DIAGNOSIS — I10 Essential (primary) hypertension: Secondary | ICD-10-CM

## 2014-08-20 DIAGNOSIS — Z0389 Encounter for observation for other suspected diseases and conditions ruled out: Secondary | ICD-10-CM

## 2014-08-20 DIAGNOSIS — R7989 Other specified abnormal findings of blood chemistry: Secondary | ICD-10-CM

## 2014-08-20 DIAGNOSIS — D508 Other iron deficiency anemias: Secondary | ICD-10-CM

## 2014-08-20 DIAGNOSIS — R778 Other specified abnormalities of plasma proteins: Secondary | ICD-10-CM | POA: Diagnosis present

## 2014-08-20 DIAGNOSIS — J810 Acute pulmonary edema: Secondary | ICD-10-CM

## 2014-08-20 LAB — BASIC METABOLIC PANEL
ANION GAP: 16 — AB (ref 5–15)
BUN: 26 mg/dL — ABNORMAL HIGH (ref 6–23)
CALCIUM: 7.7 mg/dL — AB (ref 8.4–10.5)
CHLORIDE: 110 meq/L (ref 96–112)
CO2: 17 mEq/L — ABNORMAL LOW (ref 19–32)
CREATININE: 1.11 mg/dL — AB (ref 0.50–1.10)
GFR calc non Af Amer: 48 mL/min — ABNORMAL LOW (ref >90)
GFR, EST AFRICAN AMERICAN: 56 mL/min — AB (ref >90)
Glucose, Bld: 144 mg/dL — ABNORMAL HIGH (ref 70–99)
Potassium: 3.7 mEq/L (ref 3.7–5.3)
SODIUM: 143 meq/L (ref 137–147)

## 2014-08-20 LAB — CBC
HCT: 21.6 % — ABNORMAL LOW (ref 36.0–46.0)
HEMATOCRIT: 23.6 % — AB (ref 36.0–46.0)
HEMOGLOBIN: 7.9 g/dL — AB (ref 12.0–15.0)
Hemoglobin: 7.3 g/dL — ABNORMAL LOW (ref 12.0–15.0)
MCH: 33.5 pg (ref 26.0–34.0)
MCH: 32.8 pg (ref 26.0–34.0)
MCHC: 33.8 g/dL (ref 30.0–36.0)
MCHC: 33.5 g/dL (ref 30.0–36.0)
MCV: 99.1 fL (ref 78.0–100.0)
MCV: 97.9 fL (ref 78.0–100.0)
PLATELETS: 269 10*3/uL (ref 150–400)
PLATELETS: 314 10*3/uL (ref 150–400)
RBC: 2.18 MIL/uL — AB (ref 3.87–5.11)
RBC: 2.41 MIL/uL — AB (ref 3.87–5.11)
RDW: 13.6 % (ref 11.5–15.5)
RDW: 13.7 % (ref 11.5–15.5)
WBC: 8.9 10*3/uL (ref 4.0–10.5)
WBC: 12.8 10*3/uL — AB (ref 4.0–10.5)

## 2014-08-20 LAB — BLOOD GAS, ARTERIAL
Acid-base deficit: 4.6 mmol/L — ABNORMAL HIGH (ref 0.0–2.0)
BICARBONATE: 19.1 meq/L — AB (ref 20.0–24.0)
DRAWN BY: 31101
FIO2: 0.4 %
MECHVT: 460 mL
O2 Saturation: 99.8 %
PCO2 ART: 30.4 mmHg — AB (ref 35.0–45.0)
PEEP/CPAP: 5 cmH2O
PO2 ART: 154 mmHg — AB (ref 80.0–100.0)
Patient temperature: 98.6
RATE: 22 resp/min
TCO2: 20.1 mmol/L (ref 0–100)
pH, Arterial: 7.415 (ref 7.350–7.450)

## 2014-08-20 LAB — GLUCOSE, CAPILLARY
GLUCOSE-CAPILLARY: 254 mg/dL — AB (ref 70–99)
GLUCOSE-CAPILLARY: 148 mg/dL — AB (ref 70–99)
Glucose-Capillary: 148 mg/dL — ABNORMAL HIGH (ref 70–99)
Glucose-Capillary: 205 mg/dL — ABNORMAL HIGH (ref 70–99)
Glucose-Capillary: 192 mg/dL — ABNORMAL HIGH (ref 70–99)

## 2014-08-20 LAB — PHOSPHORUS: Phosphorus: 3.5 mg/dL (ref 2.3–4.6)

## 2014-08-20 LAB — TROPONIN I: Troponin I: 1.13 ng/mL (ref <0.30)

## 2014-08-20 LAB — MAGNESIUM: MAGNESIUM: 1.2 mg/dL — AB (ref 1.5–2.5)

## 2014-08-20 MED ORDER — LABETALOL HCL 5 MG/ML IV SOLN
10.0000 mg | INTRAVENOUS | Status: DC | PRN
  Administered 2014-08-21 – 2014-08-22 (×3): 10 mg via INTRAVENOUS
  Filled 2014-08-20 (×3): qty 4

## 2014-08-20 MED ORDER — DEXMEDETOMIDINE HCL IN NACL 400 MCG/100ML IV SOLN
0.1000 ug/kg/h | INTRAVENOUS | Status: DC
  Administered 2014-08-20: 1.2 ug/kg/h via INTRAVENOUS
  Filled 2014-08-20: qty 100

## 2014-08-20 MED ORDER — METOPROLOL TARTRATE 1 MG/ML IV SOLN
10.0000 mg | Freq: Once | INTRAVENOUS | Status: AC
  Administered 2014-08-20: 10 mg via INTRAVENOUS

## 2014-08-20 MED ORDER — LATANOPROST 0.005 % OP SOLN
1.0000 [drp] | Freq: Every day | OPHTHALMIC | Status: DC
  Administered 2014-08-20 – 2014-08-24 (×5): 1 [drp] via OPHTHALMIC
  Filled 2014-08-20: qty 2.5

## 2014-08-20 MED ORDER — FUROSEMIDE 10 MG/ML IJ SOLN
40.0000 mg | Freq: Once | INTRAMUSCULAR | Status: AC
  Administered 2014-08-20: 40 mg via INTRAVENOUS
  Filled 2014-08-20: qty 4

## 2014-08-20 MED ORDER — METOPROLOL TARTRATE 1 MG/ML IV SOLN
INTRAVENOUS | Status: AC
  Administered 2014-08-20: 5 mg
  Filled 2014-08-20: qty 10

## 2014-08-20 MED ORDER — FENTANYL CITRATE 0.05 MG/ML IJ SOLN
12.5000 ug | INTRAMUSCULAR | Status: DC | PRN
  Administered 2014-08-22 (×2): 12.5 ug via INTRAVENOUS
  Filled 2014-08-20 (×2): qty 2

## 2014-08-20 MED ORDER — DORZOLAMIDE HCL-TIMOLOL MAL 2-0.5 % OP SOLN
1.0000 [drp] | Freq: Two times a day (BID) | OPHTHALMIC | Status: DC
  Administered 2014-08-20 – 2014-08-25 (×11): 1 [drp] via OPHTHALMIC
  Filled 2014-08-20: qty 10

## 2014-08-20 MED ORDER — METOPROLOL TARTRATE 1 MG/ML IV SOLN
2.5000 mg | Freq: Four times a day (QID) | INTRAVENOUS | Status: DC
  Administered 2014-08-20 – 2014-08-22 (×7): 2.5 mg via INTRAVENOUS
  Filled 2014-08-20 (×14): qty 5

## 2014-08-20 MED ORDER — MAGNESIUM SULFATE 40 MG/ML IJ SOLN
2.0000 g | Freq: Once | INTRAMUSCULAR | Status: AC
  Administered 2014-08-20: 2 g via INTRAVENOUS
  Filled 2014-08-20: qty 50

## 2014-08-20 MED ORDER — BRIMONIDINE TARTRATE 0.2 % OP SOLN
1.0000 [drp] | Freq: Two times a day (BID) | OPHTHALMIC | Status: DC
  Administered 2014-08-20 – 2014-08-25 (×11): 1 [drp] via OPHTHALMIC
  Filled 2014-08-20: qty 5

## 2014-08-20 MED ORDER — SODIUM CHLORIDE 0.9 % IV SOLN
INTRAVENOUS | Status: DC
  Administered 2014-08-20: 16:00:00 via INTRAVENOUS

## 2014-08-20 MED ORDER — LABETALOL HCL 5 MG/ML IV SOLN
20.0000 mg | Freq: Once | INTRAVENOUS | Status: AC
  Administered 2014-08-20: 20 mg via INTRAVENOUS
  Filled 2014-08-20: qty 4

## 2014-08-20 NOTE — Progress Notes (Signed)
Rehab Admissions Coordinator Note:  Patient was screened by Cleatrice Burke for appropriateness for an Inpatient Acute Rehab Consult per PT recommendation.   At this time, we are recommending [place]. [employer] Medicare will not approve admission for this diagnosis.   Cleatrice Burke 08/20/2014, 4:11 PM  I can be reached at [phone].

## 2014-08-20 NOTE — Progress Notes (Signed)
   Patient ID: Brandy Short, female   DOB: September 22, 1941, 73 y.o.   MRN: [id number]  Peri-op CP & SOB & shock, Grossly abnormal EKG with Diffuse ST Depressions -- mild + Troponin Echo essentially normal & Cath without CAD. Troponin still mild "bump  Subjective:  Awake, but drowsy --> extubated this AM  Objective:  Vital Signs in the last 24 hours: Temp:  [97.6 F (36.4 C)-99 F (37.2 C)] 99 F (37.2 C) (08/28 0800) Pulse Rate:  [51-103] 94 (08/28 1400) Resp:  [8-22] 10 (08/28 1400) BP: (107-190)/(21-99) 141/41 mmHg (08/28 1400) SpO2:  [99 %-100 %] 100 % (08/28 1400) FiO2 (%):  [40 %] 40 % (08/28 0800) Weight:  [128 lb 12 oz (58.4 kg)] 128 lb 12 oz (58.4 kg) (08/28 0353)  Intake/Output from previous day: 08/27 0701 - 08/28 0700 In: 4198.4 [I.V.:2863.4; NG/GT:335; IV Piggyback:1000] Out: 720 [Urine:720] Intake/Output from this shift: Total I/O In: 484.8 [I.V.:329.8; NG/GT:105; IV Piggyback:50] Out: 650 [Urine:650]  Physical Exam: General: Awake on vent, comfortable  Neuro: Awake, alert, follows commands  HEENT: ETT in place, NCAT  Cardiovascular: RRR, no mgr  Lungs: CTA B  Abdomen: BS+, soft, nontender  Musculoskeletal: Minimal edema, bruising R shoulder    Lab Results:  Recent Labs  08/19/14 1117 08/20/14 0405  WBC 13.0* 8.9  HGB 8.1* 7.3*  PLT 300 269    Recent Labs  08/19/14 1117 08/20/14 0405  NA 141 143  K 4.1 3.7  CL 105 110  CO2 22 17*  GLUCOSE 215* 144*  BUN 24* 26*  CREATININE 0.96 1.11*    Recent Labs  08/19/14 2245 08/20/14 0415  TROPONINI 0.98* 1.13*   Hepatic Function Panel  Recent Labs  08/19/14 1117  PROT 5.2*  ALBUMIN 2.5*  AST 41*  ALT 25  ALKPHOS 76  BILITOT 0.7   No results found for this basename: CHOL,  in the last 72 hours No results found for this basename: PROTIME,  in the last 72 hours  Imaging: Reviewed    Cardiac Studies:  Echo & Cath reviwed (images & report personally reviewed)  Assessment/Plan:    Principal Problem:   Acute respiratory failure Active Problems:   Troponin level elevated: Type 2 MI   Abnormal finding on EKG: Diffuse ST Depression.   Coronary artery disease (CAD) excluded: No obstructive CAD on Cardiac CAth.  Much improved today - BP stable & tachycardic.  Able to wean & extubate. No CAD on Cath & normal Echo -- makes Troponin elevation unlikely to be ACS Type 1 MI -- more c/w Type 2 MI in setting of acute hypoxia & hypotension/shock.  Agree with low dose BB (when able, can convert to PO). With no CAD on CAth - agree with d/c IV Heparin.  Will sign off as cardiac seems stable.     LOS: 1 day    HARDING,DAVID W 08/20/2014, 2:35 PM

## 2014-08-20 NOTE — Care Management Note (Addendum)
    Page 1 of 1   08/26/2014     11:38:26 AM CARE MANAGEMENT NOTE 08/26/2014  Patient:  Brandy Short, Brandy Short   Account Number:  [id number]  Date Initiated:  08/19/2014  Documentation initiated by:  Elissa Hefty  Subjective/Objective Assessment:   adm w resp distress, vent     Action/Plan:   pcp dr Unk Pinto, from home   Anticipated DC Date:  08/26/2014   Anticipated DC Plan:  [place]  In-house referral  Clinical Social Worker      DC Planning Services  CM consult      Choice offered to / List presented to:             Status of service:  In process, will continue to follow Medicare Important Message given?  YES (If response is "NO", the following Medicare IM given date fields will be blank) Date Medicare IM given:  08/24/2014 Medicare IM given by:  Calloway Andrus Date Additional Medicare IM given:   Additional Medicare IM given by:    Discharge Disposition:  [place]  Per UR Regulation:  Reviewed for med. necessity/level of care/duration of stay  If discussed at [place] of Stay Meetings, dates discussed:   08/26/2014    Comments:  08/26/14 Ellan Lambert, RN, BSN [phone] Pt discharging to SNF today, per CSW arrangements.  08/25/14 Ellan Lambert, RN, BSN [phone] CSW cont to follow for SNF when medically stable, likely dc on 08/26/14, per notes.  8/28 1505 debbie dowell rn,bsn staff nse spoke w fam. they are concerned about pt going back home alone. has had several fall and now w broken arm. have made sw ref. phy ther to see also. will follow.

## 2014-08-20 NOTE — Progress Notes (Signed)
22 bit SVT run noted-pt denied any symptoms.

## 2014-08-20 NOTE — Procedures (Signed)
Extubation Procedure Note  Patient Details:   Name: Brandy Short DOB: 1941-02-22 MRN: [id number]   Airway Documentation:     Evaluation  O2 sats: stable throughout Complications: No apparent complications Patient did tolerate procedure well. Bilateral Breath Sounds: Clear;Diminished   Yes  Pt tolerated wean, negative for cuff leak but ok per Dr [place] Bells for extubation. Pt extubated to 3L. No Complications. No dyspnea or stridor noted post extubation.   Jesse Sans 08/20/2014, 10:43 AM

## 2014-08-20 NOTE — Progress Notes (Signed)
eLink Physician-Brief Progress Note Patient Name: Brandy Short DOB: 1941/06/30 MRN: [id number]   Date of Service  08/20/2014  HPI/Events of Note  Hypertension with SBP 200  eICU Interventions  Lopressor 10mg  IV x 1      Intervention Category Intermediate Interventions: Hypertension - evaluation and management  Chassity Ludke 08/20/2014, 6:42 PM

## 2014-08-20 NOTE — Evaluation (Signed)
Physical Therapy Evaluation Patient Details Name: Brandy Short MRN: [id number] DOB: 1941/09/27 Today's Date: 08/20/2014   History of Present Illness  Pt is a 73 y.o. female with a history of DM and hypertension, who is admitted to the hospital with fracture of her right humerus after fall at home (x5 falls in past 2 months per RN). Apparently, prior to undergoing ORIF surgery she had a respiratory arrest in the PACU and was intubated. ECG showed diffuse ST segment depression. She was then referred for an urgent cardiac catheterization on 08/19/14. Pt was extubated on 08/20/14.  Clinical Impression  Pt admitted with the above. Pt currently with functional limitations due to the deficits listed below (see PT Problem List). At the time of PT eval pt was very lethargic, and required +2 assist for side stepping EOB. Pt reports she was independent without an AD PTA. Pt will benefit from skilled PT to increase their independence and safety with mobility to allow discharge to the venue listed below. Recommending CIR consult.       Follow Up Recommendations CIR;Supervision/Assistance - 24 hour    Equipment Recommendations  Rolling walker with 5" wheels    Recommendations for Other Services Rehab consult     Precautions / Restrictions Precautions Precautions: Fall Precaution Comments: Per RN x5 falls in past 2 months. Required Braces or Orthoses: Sling Restrictions Weight Bearing Restrictions: No Other Position/Activity Restrictions: Requested clarification of WB status through RUE.      Mobility  Bed Mobility Overal bed mobility: Needs Assistance Bed Mobility: Supine to Sit;Sit to Supine     Supine to sit: Min assist Sit to supine: Min assist   General bed mobility comments: Pt requires increased time and direct commands to initiate movement. Assist for trunk support and to swing LE's off EOB during transfer to EOB.   Transfers Overall transfer level: Needs assistance Equipment used:  Rolling walker (2 wheeled);2 person hand held assist Transfers: Sit to/from Stand Sit to Stand: Min assist;+2 physical assistance         General transfer comment: VC's for hand placement on seated surface for safety. Assist for steadying herself prior to initiating side-stepping.  Ambulation/Gait Ambulation/Gait assistance: Min assist;+2 physical assistance Ambulation Distance (Feet): 3 Feet Assistive device: 2 person hand held assist Gait Pattern/deviations: Step-to pattern;Trunk flexed;Narrow base of support Gait velocity: Decreased Gait velocity interpretation: Below normal speed for age/gender General Gait Details: VC's for sequencing and technique. Pt very unsteady with any movement.   Stairs            Wheelchair Mobility    Modified Rankin (Stroke Patients Only)       Balance Overall balance assessment: Needs assistance Sitting-balance support: Feet supported;Single extremity supported Sitting balance-Leahy Scale: Poor Sitting balance - Comments: Assist required Postural control: Left lateral lean;Other (comment) (Anterior lean) Standing balance support: Single extremity supported Standing balance-Leahy Scale: Poor Standing balance comment: Required +2 assist to maintain standing balance.                              Pertinent Vitals/Pain Pain Assessment: No/denies pain    Home Living Family/patient expects to be discharged to:: Skilled nursing facility Living Arrangements: Alone Available Help at Discharge: Family;Available PRN/intermittently (Possibly - pt unsure)                  Prior Function Level of Independence: Independent         Comments: Pt has  been living alone and reports that she is independent with her ADL's and ambulation. Pt also admits to falling at home often. Could not tell me details about her living situation.      Hand Dominance   Dominant Hand: Right    Extremity/Trunk Assessment   Upper Extremity  Assessment: RUE deficits/detail RUE Deficits / Details: Decreased strength and AROM consistent with humeral fracture. PT kept pt NWB during session as no clarification of WB status was received.  RUE: Unable to fully assess due to immobilization       Lower Extremity Assessment: Generalized weakness      Cervical / Trunk Assessment: Kyphotic (Significant forward head posture; difficulty lying supine)  Communication   Communication: Expressive difficulties (Pt very sleepy)  Cognition Arousal/Alertness: Lethargic Behavior During Therapy: Flat affect Overall Cognitive Status: No family/caregiver present to determine baseline cognitive functioning                      General Comments      Exercises        Assessment/Plan    PT Assessment Patient needs continued PT services  PT Diagnosis Difficulty walking;Generalized weakness   PT Problem List Decreased strength;Decreased range of motion;Decreased balance;Decreased activity tolerance;Decreased mobility;Decreased knowledge of use of DME;Decreased safety awareness;Decreased knowledge of precautions  PT Treatment Interventions DME instruction;Gait training;Stair training;Functional mobility training;Therapeutic activities;Therapeutic exercise;Neuromuscular re-education;Patient/family education   PT Goals (Current goals can be found in the Care Plan section) Acute Rehab PT Goals Patient Stated Goal: None stated PT Goal Formulation: With patient Time For Goal Achievement: 09/03/14 Potential to Achieve Goals: Fair    Frequency Min 3X/week   Barriers to discharge   Pt lives alone, hx of falls    Co-evaluation               End of Session Equipment Utilized During Treatment: Gait belt;Oxygen Activity Tolerance: Patient limited by lethargy Patient left: in bed;with bed alarm set;with call bell/phone within reach Nurse Communication: Mobility status         Time: 6659-9357 PT Time Calculation (min): 22  min   Charges:   PT Evaluation $Initial PT Evaluation Tier I: 1 Procedure PT Treatments $Therapeutic Activity: 8-22 mins   PT G Codes:          Jolyn Lent 08/20/2014, 3:58 PM  Jolyn Lent, PT, DPT Acute Rehabilitation Services Pager: [phone]

## 2014-08-20 NOTE — Progress Notes (Addendum)
200cc of fentanyl wasted from a 250cc bag.  Witness per two RNs. Orrin Brigham, RN

## 2014-08-20 NOTE — Progress Notes (Signed)
Pt getting fidgety and confused,BP better after labetalol given.HR remains in the low 100s.Pt keep taking [place] out.Marland Kitchen

## 2014-08-20 NOTE — Progress Notes (Signed)
PULMONARY / CRITICAL CARE MEDICINE   Name: Brandy Short MRN: [id number] DOB: Sep 23, 1941    ADMISSION DATE:  08/19/2014  REFERRING MD :  Tamera Punt   CHIEF COMPLAINT:  Acute respiratory failure   INITIAL PRESENTATION: 73 yo female with hx HTN, DM, presented 8/27 for scheduled ORIF R humerus in setting displaced fx.  IS block done and 15 mins later pt became very SOB, HTN and c/o chest tightness and ultimately required intubation. Did have closed reduction at bedside but not have ORIF.  PCCM called to admit.   STUDIES:  8/27 Echo>>>  SIGNIFICANT EVENTS: 8/27 - humerus manipulation with scalene block, rapid declines, ST changes, ett 8/27 - LHC > clean coronary arteries   SUBJECTIVE:  Comfortable on PSV for three hours this morning  VITAL SIGNS: Temp:  [97.6 F (36.4 C)-99 F (37.2 C)] 99 F (37.2 C) (08/28 0800) Pulse Rate:  [59-124] 80 (08/28 0900) Resp:  [8-23] 8 (08/28 0900) BP: (86-190)/(21-99) 107/33 mmHg (08/28 0900) SpO2:  [100 %] 100 % (08/28 0900) FiO2 (%):  [40 %-100 %] 40 % (08/28 0800) Weight:  [56.1 kg (123 lb 10.9 oz)-58.4 kg (128 lb 12 oz)] 58.4 kg (128 lb 12 oz) (08/28 0353) HEMODYNAMICS: CVP:  [5 mmHg-14 mmHg] 14 mmHg VENTILATOR SETTINGS: Vent Mode:  [-] PRVC FiO2 (%):  [40 %-100 %] 40 % Set Rate:  [14 bmp-22 bmp] 22 bmp Vt Set:  [460 mL] 460 mL PEEP:  [5 cmH20] 5 cmH20 Plateau Pressure:  [18 cmH20-22 cmH20] 18 cmH20 INTAKE / OUTPUT:  Intake/Output Summary (Last 24 hours) at 08/20/14 1024 Last data filed at 08/20/14 7510  Gross per 24 hour  Intake 2950.12 ml  Output    785 ml  Net 2165.12 ml    PHYSICAL EXAMINATION: General:  Awake on vent, comfortable  Neuro:  Awake, alert, follows commands  HEENT:  ETT in place, NCAT Cardiovascular:  RRR, no mgr Lungs:  CTA B  Abdomen:  BS+, soft, nontender Musculoskeletal:  Minimal edema, bruising R shoulder   LABS:  CBC  Recent Labs Lab 08/19/14 0822 08/19/14 1117 08/20/14 0405  WBC 7.8 13.0* 8.9   HGB 9.1* 8.1* 7.3*  HCT 26.9* 23.9* 21.6*  PLT 331 300 269   Coag's  Recent Labs Lab 08/19/14 1433  INR 1.16   BMET  Recent Labs Lab 08/19/14 0822 08/19/14 1117 08/20/14 0405  NA 142 141 143  K 4.3 4.1 3.7  CL 104 105 110  CO2 25 22 17*  BUN 25* 24* 26*  CREATININE 1.04 0.96 1.11*  GLUCOSE 143* 215* 144*   Electrolytes  Recent Labs Lab 08/19/14 0822 08/19/14 1117 08/20/14 0405  CALCIUM 9.1 7.9* 7.7*  MG  --  1.4* 1.2*  PHOS  --  4.1 3.5   Sepsis Markers No results found for this basename: LATICACIDVEN, PROCALCITON, O2SATVEN,  in the last 168 hours ABG  Recent Labs Lab 08/19/14 0950 08/19/14 1242 08/20/14 0305  PHART 7.106* 7.332* 7.415  PCO2ART 80.3* 40.7 30.4*  PO2ART 161.0* 220.0* 154.0*   Liver Enzymes  Recent Labs Lab 08/19/14 1117  AST 41*  ALT 25  ALKPHOS 76  BILITOT 0.7  ALBUMIN 2.5*   Cardiac Enzymes  Recent Labs Lab 08/19/14 1840 08/19/14 2245 08/20/14 0415  TROPONINI 0.77* 0.98* 1.13*   Glucose  Recent Labs Lab 08/19/14 1628 08/19/14 1629 08/19/14 1943 08/19/14 2357 08/20/14 0357 08/20/14 0750  GLUCAP 52* 88 97 163* 148* 205*    Imaging Dg Chest Port 1 [address]  08/19/2014   CLINICAL DATA:  Intubation.  EXAM: PORTABLE CHEST - 1 VIEW  COMPARISON:  08/19/2014.  FINDINGS: Endotracheal tube noted 2.7 cm above the to the carina. NG tube noted with tip below the left hemidiaphragm. Left IJ line noted with the tip in superior vena cava. Mediastinum and hilar structures normal the lungs are clear. No focal infiltrate. Heart size normal. No acute bony abnormality.  IMPRESSION: 1. Endotracheal tube, NG tube, and left IJ line in good anatomic position. 2. No acute cardiopulmonary disease.   Electronically Signed   By: Marcello Moores  Register   On: 08/19/2014 12:25   Dg Chest Port 1 View  08/19/2014   CLINICAL DATA:  Intubation.  EXAM: PORTABLE CHEST - 1 VIEW  COMPARISON:  01/20/2014  FINDINGS: Endotracheal tube tip lies 3.3 cm above the  carina.  Stable upper lobe scarring. No lung consolidation or edema. No pleural effusion or pneumothorax.  Cardiac silhouette is normal in size. No mediastinal or hilar masses.  There is a fracture of the proximal right humerus not evident previously. There is no significant healing. This may be acute or subacute.  IMPRESSION: 1. Endotracheal tube is well positioned. 2. No acute cardiopulmonary disease. 3. Fracture of the proximal right humerus.   Electronically Signed   By: Lajean Manes M.D.   On: 08/19/2014 10:45   Dg Abd Portable 1v  08/19/2014   CLINICAL DATA:  Orogastric tube placement.  EXAM: PORTABLE ABDOMEN - 1 VIEW  COMPARISON:  None.  FINDINGS: Orogastric tube tip lies in the mid stomach in the left upper quadrant.  Bowel gas pattern is unremarkable. There are dense calcifications along a normal caliber abdominal aorta and iliac arteries.  IMPRESSION: Orogastric tube tip lies in the mid stomach.  No acute findings.   Electronically Signed   By: Lajean Manes M.D.   On: 08/19/2014 12:39     ASSESSMENT / PLAN:  PULMONARY OETT  8/27>>>8/28 Acute respiratory failure > pulm edema? Pulm edema Hx asthma not in exacerbation   P:   Extubate Aspiration precautions Advance diet post extubation 4 hours  CARDIOVASCULAR CVL L IJ CVL 8/27>>> Class 2 NSTEMI, normal cath HTN crisis on PACU, > stress related? P:  F/u echo Restart b-blocker IV 8/28 Restart losartan and HCTZ on 8/29 Lasix x1 today D/c heparin gtt per cardiology  RENAL No acute issues P:   Daily BMET, UOP Replace electrolytes as needed  GASTROINTESTINAL No active issue  P:   NPO 4 hours post extubation PPI   HEMATOLOGIC Anemia without bleeding P:  D/c heparin gtt Transfuse for Hgb < 7 Monitor cbc Occult blood stool  INFECTIOUS No active infection  P:   Trend wbc, fever curve   ENDOCRINE DM   P:   SSI  NEUROLOGIC Sedation  P:   Daily WUA  Fentanyl gtt   ORTHO:  R humerus fx - s/p closed  reduction  No further plans for operative repair   TODAY'S SUMMARY:  73 yo female with SOB, chest tightness after R arm nerve block. Suspect flash pulmonary edema from hypertensive urgency. Extubate today, restart b-blocker  I have personally obtained a history, examined the patient, evaluated laboratory and imaging results, formulated the assessment and plan and placed orders. CRITICAL CARE: The patient is critically ill with multiple organ systems failure and requires high complexity decision making for assessment and support, frequent evaluation and titration of therapies, application of advanced monitoring technologies and extensive interpretation of multiple databases. Critical Care Time devoted to  patient care services described in this note is 35 minutes.   Roselie Awkward, MD [place] [facility] Pager: [phone] Cell: [phone] If no response, call [phone]

## 2014-08-20 NOTE — Progress Notes (Signed)
   PATIENT ID: Brandy Short   1 Day Post-Op Procedure(s) (LRB): LEFT HEART CATHETERIZATION WITH CORONARY ANGIOGRAM (N/A)  Subjective: still intubated.  Troponins elevated at this point.  Report says she was placed in sling yesterday, but not currently in sling.  Objective:  Filed Vitals:   08/20/14 0800  BP: 128/60  Pulse: 97  Temp: 99 F (37.2 C)  Resp: 18     Intubated.  Opens eyes to verbal stimulus.  Holding R shoulder forward.  Proximal humerus fragment is again prominent, but skin is fine.  Labs:   Recent Labs  08/19/14 0822 08/19/14 1117 08/20/14 0405  HGB 9.1* 8.1* 7.3*   Recent Labs  08/19/14 1117 08/20/14 0405  WBC 13.0* 8.9  RBC 2.44* 2.18*  HCT 23.9* 21.6*  PLT 300 269   Recent Labs  08/19/14 1117 08/20/14 0405  NA 141 143  K 4.1 3.7  CL 105 110  CO2 22 17*  BUN 24* 26*  CREATININE 0.96 1.11*  GLUCOSE 215* 144*  CALCIUM 7.9* 7.7*    Assessment and Plan:R proximal humerus fracture Given events of last 24 hours, the safest thing for her will be nonoperative management of this fracture Sling RUE at all times. Greatly appreciate critical care.

## 2014-08-20 NOTE — Progress Notes (Signed)
Elink MD notified of Pt's current B/P

## 2014-08-21 LAB — BASIC METABOLIC PANEL
ANION GAP: 19 — AB (ref 5–15)
BUN: 32 mg/dL — ABNORMAL HIGH (ref 6–23)
CHLORIDE: 101 meq/L (ref 96–112)
CO2: 19 mEq/L (ref 19–32)
Calcium: 9.4 mg/dL (ref 8.4–10.5)
Creatinine, Ser: 1.24 mg/dL — ABNORMAL HIGH (ref 0.50–1.10)
GFR calc non Af Amer: 42 mL/min — ABNORMAL LOW (ref >90)
GFR, EST AFRICAN AMERICAN: 49 mL/min — AB (ref >90)
Glucose, Bld: 181 mg/dL — ABNORMAL HIGH (ref 70–99)
POTASSIUM: 3.7 meq/L (ref 3.7–5.3)
SODIUM: 139 meq/L (ref 137–147)

## 2014-08-21 LAB — CBC WITH DIFFERENTIAL/PLATELET
Basophils Absolute: 0 10*3/uL (ref 0.0–0.1)
Basophils Relative: 0 % (ref 0–1)
EOS PCT: 0 % (ref 0–5)
Eosinophils Absolute: 0 10*3/uL (ref 0.0–0.7)
HCT: 25.1 % — ABNORMAL LOW (ref 36.0–46.0)
Hemoglobin: 8.2 g/dL — ABNORMAL LOW (ref 12.0–15.0)
LYMPHS PCT: 2 % — AB (ref 12–46)
Lymphs Abs: 0.4 10*3/uL — ABNORMAL LOW (ref 0.7–4.0)
MCH: 32 pg (ref 26.0–34.0)
MCHC: 32.7 g/dL (ref 30.0–36.0)
MCV: 98 fL (ref 78.0–100.0)
Monocytes Absolute: 1 10*3/uL (ref 0.1–1.0)
Monocytes Relative: 6 % (ref 3–12)
Neutro Abs: 15.6 10*3/uL — ABNORMAL HIGH (ref 1.7–7.7)
Neutrophils Relative %: 92 % — ABNORMAL HIGH (ref 43–77)
PLATELETS: 340 10*3/uL (ref 150–400)
RBC: 2.56 MIL/uL — ABNORMAL LOW (ref 3.87–5.11)
RDW: 13.7 % (ref 11.5–15.5)
WBC: 17 10*3/uL — AB (ref 4.0–10.5)

## 2014-08-21 LAB — GLUCOSE, CAPILLARY
GLUCOSE-CAPILLARY: 278 mg/dL — AB (ref 70–99)
Glucose-Capillary: 204 mg/dL — ABNORMAL HIGH (ref 70–99)
Glucose-Capillary: 187 mg/dL — ABNORMAL HIGH (ref 70–99)
Glucose-Capillary: 249 mg/dL — ABNORMAL HIGH (ref 70–99)
Glucose-Capillary: 225 mg/dL — ABNORMAL HIGH (ref 70–99)

## 2014-08-21 MED ORDER — HYDROCHLOROTHIAZIDE 25 MG PO TABS
25.0000 mg | ORAL_TABLET | Freq: Every day | ORAL | Status: DC
  Administered 2014-08-21 – 2014-08-22 (×2): 25 mg via ORAL
  Filled 2014-08-21 (×2): qty 1

## 2014-08-21 MED ORDER — POTASSIUM CHLORIDE CRYS ER 20 MEQ PO TBCR
40.0000 meq | EXTENDED_RELEASE_TABLET | Freq: Three times a day (TID) | ORAL | Status: AC
  Administered 2014-08-21 (×2): 40 meq via ORAL
  Filled 2014-08-21 (×2): qty 2

## 2014-08-21 MED ORDER — LOSARTAN POTASSIUM 25 MG PO TABS
25.0000 mg | ORAL_TABLET | Freq: Every day | ORAL | Status: DC
  Administered 2014-08-21 – 2014-08-22 (×2): 25 mg via ORAL
  Filled 2014-08-21 (×2): qty 1

## 2014-08-21 MED ORDER — POTASSIUM CHLORIDE CRYS ER 20 MEQ PO TBCR
20.0000 meq | EXTENDED_RELEASE_TABLET | ORAL | Status: DC
  Administered 2014-08-21: 20 meq via ORAL
  Filled 2014-08-21: qty 1

## 2014-08-21 MED ORDER — HYDRALAZINE HCL 20 MG/ML IJ SOLN
10.0000 mg | INTRAMUSCULAR | Status: DC | PRN
  Administered 2014-08-21 – 2014-08-24 (×2): 10 mg via INTRAVENOUS
  Filled 2014-08-21 (×2): qty 1

## 2014-08-21 NOTE — Progress Notes (Signed)
BP and HR better after labetalol-pt more restful less fidgety and sob.

## 2014-08-21 NOTE — Progress Notes (Signed)
PULMONARY / CRITICAL CARE MEDICINE   Name: Brandy Short MRN: [id number] DOB: [birth year]    ADMISSION DATE:  08/19/2014  REFERRING MD :  Tamera Punt   CHIEF COMPLAINT:  Acute respiratory failure   INITIAL PRESENTATION: 73 yo female with hx HTN, DM, presented 8/27 for scheduled ORIF R humerus in setting displaced fx.  IS block done and 15 mins later pt became very SOB, HTN and c/o chest tightness and ultimately required intubation. Did have closed reduction at bedside but not have ORIF.  PCCM called to admit.   STUDIES:  8/27 Echo>>>  SIGNIFICANT EVENTS: 8/27 - humerus manipulation with scalene block, rapid declines, ST changes, ett 8/27 - LHC > clean coronary arteries  SUBJECTIVE:  No events overnight, wants to get out of bed.  VITAL SIGNS: Temp:  [98 F (36.7 C)-98.7 F (37.1 C)] 98.7 F (37.1 C) (08/29 0728) Pulse Rate:  [51-112] 106 (08/29 0800) Resp:  [10-29] 19 (08/29 0800) BP: (99-207)/(29-126) 128/53 mmHg (08/29 0800) SpO2:  [95 %-100 %] 97 % (08/29 0800) Weight:  [120 lb 11.2 oz (54.749 kg)] 120 lb 11.2 oz (54.749 kg) (08/29 0354)  HEMODYNAMICS: CVP:  [12 mmHg] 12 mmHg  VENTILATOR SETTINGS:   INTAKE / OUTPUT:  Intake/Output Summary (Last 24 hours) at 08/21/14 0924 Last data filed at 08/21/14 0700  Gross per 24 hour  Intake 998.01 ml  Output   2290 ml  Net -1291.99 ml   PHYSICAL EXAMINATION: General:  Awake, interactive, following all commands.  Neuro:  Moving all ext to command HEENT:  ETT in place, NCAT Cardiovascular:  RRR, no mgr Lungs:  CTA B  Abdomen:  BS+, soft, nontender Musculoskeletal:  Minimal edema, bruising R shoulder   LABS:  CBC  Recent Labs Lab 08/20/14 0405 08/20/14 1500 08/21/14 0412  WBC 8.9 12.8* 17.0*  HGB 7.3* 7.9* 8.2*  HCT 21.6* 23.6* 25.1*  PLT 269 314 340   Coag's  Recent Labs Lab 08/19/14 1433  INR 1.16   BMET  Recent Labs Lab 08/19/14 1117 08/20/14 0405 08/21/14 0412  NA 141 143 139  K 4.1 3.7 3.7  CL  105 110 101  CO2 22 17* 19  BUN 24* 26* 32*  CREATININE 0.96 1.11* 1.24*  GLUCOSE 215* 144* 181*   Electrolytes  Recent Labs Lab 08/19/14 1117 08/20/14 0405 08/21/14 0412  CALCIUM 7.9* 7.7* 9.4  MG 1.4* 1.2*  --   PHOS 4.1 3.5  --    Sepsis Markers No results found for this basename: LATICACIDVEN, PROCALCITON, O2SATVEN,  in the last 168 hours ABG  Recent Labs Lab 08/19/14 0950 08/19/14 1242 08/20/14 0305  PHART 7.106* 7.332* 7.415  PCO2ART 80.3* 40.7 30.4*  PO2ART 161.0* 220.0* 154.0*   Liver Enzymes  Recent Labs Lab 08/19/14 1117  AST 41*  ALT 25  ALKPHOS 76  BILITOT 0.7  ALBUMIN 2.5*   Cardiac Enzymes  Recent Labs Lab 08/19/14 1840 08/19/14 2245 08/20/14 0415  TROPONINI 0.77* 0.98* 1.13*   Glucose  Recent Labs Lab 08/20/14 1159 08/20/14 1604 08/20/14 1937 08/21/14 0006 08/21/14 0352 08/21/14 0831  GLUCAP 254* 192* 148* 204* 187* 278*    Imaging Dg Chest Port 1 View  08/20/2014   CLINICAL DATA:  Endotracheal tube placement  EXAM: PORTABLE CHEST - 1 VIEW  COMPARISON:  In 07/12/2014  FINDINGS: Endotracheal tube tip is now 7 mm above the carina. Internal jugular line has been retracted with tip now over the brachiocephalic vein on the left. There are 2 left  internal jugular central lines, 1 with tip over the cavoatrial junction and the other with tip over the brachiocephalic vein on the left. No pneumothorax. Heart size is normal. Lungs are clear.  IMPRESSION: Lines and tubes as described   Electronically Signed   By: Skipper Cliche M.D.   On: 08/20/2014 07:32   ASSESSMENT / PLAN:  PULMONARY OETT  8/27>>>8/28 Acute respiratory failure > pulm edema? Pulm edema Hx asthma not in exacerbation   P:   Titrate O2 for sat of 88-92%. IS per RT protocol. Amublate Advance diet post extubation 4 hours.  CARDIOVASCULAR CVL L IJ CVL 8/27>>> Class 2 NSTEMI, normal cath HTN crisis on PACU, > stress related? P:  Echo per cards Continue b-blocker IV  8/28 Restart losartan and HCTZ on 8/29 Hold further diureses. D/ced heparin gtt per cardiology  RENAL No acute issues P:   Daily BMET, UOP Replace electrolytes as needed Hold further diureses for now  GASTROINTESTINAL No active issue  P:   Heart healthy diet PPI   HEMATOLOGIC Anemia without bleeding P:  D/c heparin gtt Transfuse for Hgb < 7 Monitor cbc Occult blood stool  INFECTIOUS No active infection  P:   Trend wbc, fever curve   ENDOCRINE DM   P:   SSI  NEUROLOGIC Sedation  P:   Daily WUA  Fentanyl gtt   ORTHO:  R humerus fx - s/p closed reduction  No further plans for operative repair   TODAY'S SUMMARY:  Transfer to tele, restart anti-HTN, transfer care to [facility], PCCM will sign off, please call back if needed.  I have personally obtained a history, examined the patient, evaluated laboratory and imaging results, formulated the assessment and plan and placed orders.  Rush Farmer, M.D. [facility] Pulmonary/Critical Care Medicine. Pager: [phone]. After hours pager: [phone].

## 2014-08-21 NOTE — Progress Notes (Signed)
[facility] ADULT ICU REPLACEMENT PROTOCOL FOR AM LAB REPLACEMENT ONLY  The patient does apply for the [facility] Adult ICU Electrolyte Replacment Protocol based on the criteria listed below:   1. Is GFR >/= 40 ml/min? yes  Patient's GFR today is 42 2. Is urine output >/= 0.5 ml/kg/hr for the last 6 hours? Yes.   Patient's UOP is 09 ml/kg/hr 3. Is BUN < 60 mg/dL? Yes.    Patient's BUN today is 32 4. Abnormal electrolyte(s): K+3.7 5. Ordered repletion with: protocol 6. If a panic level lab has been reported, has the CCM MD in charge been notified? Yes.  .   Physician:  E Deterding  [facility] 08/21/2014 5:29 AM

## 2014-08-22 ENCOUNTER — Inpatient Hospital Stay (HOSPITAL_COMMUNITY): Payer: Medicare Other

## 2014-08-22 DIAGNOSIS — G9340 Encephalopathy, unspecified: Secondary | ICD-10-CM

## 2014-08-22 LAB — CBC
HEMATOCRIT: 24.1 % — AB (ref 36.0–46.0)
HEMOGLOBIN: 7.9 g/dL — AB (ref 12.0–15.0)
MCH: 32.6 pg (ref 26.0–34.0)
MCHC: 32.8 g/dL (ref 30.0–36.0)
MCV: 99.6 fL (ref 78.0–100.0)
Platelets: 303 10*3/uL (ref 150–400)
RBC: 2.42 MIL/uL — ABNORMAL LOW (ref 3.87–5.11)
RDW: 13.6 % (ref 11.5–15.5)
WBC: 13.5 10*3/uL — ABNORMAL HIGH (ref 4.0–10.5)

## 2014-08-22 LAB — URINALYSIS, ROUTINE W REFLEX MICROSCOPIC
GLUCOSE, UA: NEGATIVE mg/dL
KETONES UR: NEGATIVE mg/dL
Nitrite: NEGATIVE
Specific Gravity, Urine: 1.022 (ref 1.005–1.030)
UROBILINOGEN UA: 1 mg/dL (ref 0.0–1.0)
pH: 5 (ref 5.0–8.0)

## 2014-08-22 LAB — URINE MICROSCOPIC-ADD ON

## 2014-08-22 LAB — BASIC METABOLIC PANEL
Anion gap: 14 (ref 5–15)
BUN: 37 mg/dL — AB (ref 6–23)
CALCIUM: 9.8 mg/dL (ref 8.4–10.5)
CO2: 21 mEq/L (ref 19–32)
Chloride: 105 mEq/L (ref 96–112)
Creatinine, Ser: 1.27 mg/dL — ABNORMAL HIGH (ref 0.50–1.10)
GFR calc Af Amer: 47 mL/min — ABNORMAL LOW (ref >90)
GFR calc non Af Amer: 41 mL/min — ABNORMAL LOW (ref >90)
GLUCOSE: 166 mg/dL — AB (ref 70–99)
Potassium: 4.8 mEq/L (ref 3.7–5.3)
SODIUM: 140 meq/L (ref 137–147)

## 2014-08-22 LAB — MAGNESIUM: Magnesium: 1.8 mg/dL (ref 1.5–2.5)

## 2014-08-22 LAB — GLUCOSE, CAPILLARY
GLUCOSE-CAPILLARY: 151 mg/dL — AB (ref 70–99)
GLUCOSE-CAPILLARY: 203 mg/dL — AB (ref 70–99)
GLUCOSE-CAPILLARY: 228 mg/dL — AB (ref 70–99)
GLUCOSE-CAPILLARY: 316 mg/dL — AB (ref 70–99)
GLUCOSE-CAPILLARY: 190 mg/dL — AB (ref 70–99)
Glucose-Capillary: 197 mg/dL — ABNORMAL HIGH (ref 70–99)
Glucose-Capillary: 332 mg/dL — ABNORMAL HIGH (ref 70–99)

## 2014-08-22 LAB — PHOSPHORUS: Phosphorus: 4.1 mg/dL (ref 2.3–4.6)

## 2014-08-22 MED ORDER — INSULIN ASPART 100 UNIT/ML ~~LOC~~ SOLN
0.0000 [IU] | Freq: Three times a day (TID) | SUBCUTANEOUS | Status: DC
  Administered 2014-08-22 – 2014-08-24 (×2): 3 [IU] via SUBCUTANEOUS
  Administered 2014-08-24 – 2014-08-25 (×4): 2 [IU] via SUBCUTANEOUS
  Administered 2014-08-26 (×2): 3 [IU] via SUBCUTANEOUS

## 2014-08-22 MED ORDER — LORAZEPAM 2 MG/ML IJ SOLN
0.5000 mg | Freq: Once | INTRAMUSCULAR | Status: AC
  Administered 2014-08-22: 0.5 mg via INTRAVENOUS
  Filled 2014-08-22: qty 1

## 2014-08-22 MED ORDER — INSULIN DETEMIR 100 UNIT/ML ~~LOC~~ SOLN
8.0000 [IU] | Freq: Every day | SUBCUTANEOUS | Status: DC
  Administered 2014-08-22 – 2014-08-26 (×5): 8 [IU] via SUBCUTANEOUS
  Filled 2014-08-22 (×5): qty 0.08

## 2014-08-22 MED ORDER — DIFLUPREDNATE 0.05 % OP EMUL
1.0000 [drp] | Freq: Two times a day (BID) | OPHTHALMIC | Status: DC
  Administered 2014-08-22 – 2014-08-25 (×6): 1 [drp] via OPHTHALMIC

## 2014-08-22 MED ORDER — LOSARTAN POTASSIUM 50 MG PO TABS
100.0000 mg | ORAL_TABLET | Freq: Every day | ORAL | Status: DC
  Administered 2014-08-22 – 2014-08-24 (×3): 100 mg via ORAL
  Filled 2014-08-22 (×4): qty 2

## 2014-08-22 MED ORDER — BRIMONIDINE TARTRATE 0.15 % OP SOLN: 1.0000 [drp] | Freq: Two times a day (BID) | OPHTHALMIC | Status: DC

## 2014-08-22 MED ORDER — BISOPROLOL FUMARATE 10 MG PO TABS
10.0000 mg | ORAL_TABLET | Freq: Every day | ORAL | Status: DC
  Administered 2014-08-22 – 2014-08-26 (×5): 10 mg via ORAL
  Filled 2014-08-22 (×5): qty 1

## 2014-08-22 MED ORDER — LATANOPROST 0.005 % OP SOLN: 1.0000 [drp] | Freq: Every day | OPHTHALMIC | Status: DC

## 2014-08-22 MED ORDER — HALOPERIDOL 1 MG PO TABS
1.0000 mg | ORAL_TABLET | Freq: Two times a day (BID) | ORAL | Status: DC | PRN
  Filled 2014-08-22: qty 1

## 2014-08-22 MED ORDER — INSULIN ASPART 100 UNIT/ML ~~LOC~~ SOLN: 0.0000 [IU] | Freq: Every day | SUBCUTANEOUS | Status: DC

## 2014-08-22 MED ORDER — FENTANYL CITRATE 0.05 MG/ML IJ SOLN
12.5000 ug | INTRAMUSCULAR | Status: DC | PRN
  Administered 2014-08-22: 12.5 ug via INTRAVENOUS
  Filled 2014-08-22: qty 2

## 2014-08-22 MED ORDER — DORZOLAMIDE HCL-TIMOLOL MAL 2-0.5 % OP SOLN: 1.0000 [drp] | Freq: Two times a day (BID) | OPHTHALMIC | Status: DC

## 2014-08-22 MED ORDER — HYDROCODONE-ACETAMINOPHEN 5-325 MG PO TABS
0.5000 | ORAL_TABLET | ORAL | Status: DC | PRN
  Administered 2014-08-23 – 2014-08-24 (×2): 1 via ORAL
  Administered 2014-08-25: 0.5 via ORAL
  Administered 2014-08-26: 1 via ORAL
  Filled 2014-08-22 (×4): qty 1

## 2014-08-22 MED ORDER — INSULIN DETEMIR 100 UNIT/ML FLEXPEN
8.0000 [IU] | PEN_INJECTOR | Freq: Every day | SUBCUTANEOUS | Status: DC
  Filled 2014-08-22: qty 3

## 2014-08-22 NOTE — Progress Notes (Addendum)
Chart reviewed.   [facility] PROGRESS NOTE  Brandy Short DDU:[id number] DOB: 09-Aug-1941 DOA: 08/19/2014 PCP: Alesia Richards, MD  Assessment/Plan:  Principal Problem:   Acute respiratory failure resolved Active Problems:   Troponin level elevated: Type 2 MI   Coronary artery disease (CAD) excluded: No obstructive CAD on Cardiac CAth. Humerus fracture Encephalopathy. Likely medication related, but will check CXR and UA, NH3. Minimize medications that could worsen confusion. Had recent TSH, B12, folate all ok. HTN: resume meds DM: increase SSI. Add levemir Anemia: monitor. Recent anemia panel WNL  Code Status:  full Family Communication:  Sister at bedside Disposition Plan:  CIR v. SNF  Consultants:  Ortho  PCCM  cardiology  Procedures:   Cardiac cath  Scalene block  ETT  Left IJ CVL   HPI/Subjective:  Unable. Per RN, periods of agitation  Objective: Filed Vitals:   08/22/14 1015  BP: 159/64  Pulse:   Temp:   Resp: 16    Intake/Output Summary (Last 24 hours) at 08/22/14 1440 Last data filed at 08/22/14 0800  Gross per 24 hour  Intake    240 ml  Output    250 ml  Net    -10 ml   Filed Weights   08/21/14 0354 08/21/14 1607 08/22/14 0442  Weight: 54.749 kg (120 lb 11.2 oz) 54.749 kg (120 lb 11.2 oz) 55.7 kg (122 lb 12.7 oz)    Exam:   General:  Drowsy. Does not reliably answer questions of follow commands  Cardiovascular: RRR without MGR  Respiratory: CTA without WRR  Abdomen: S, NT, ND  Ext: right arm in sling. No CCE  Basic Metabolic Panel:  Recent Labs Lab 08/19/14 0822 08/19/14 1117 08/20/14 0405 08/21/14 0412 08/22/14 0555  NA 142 141 143 139 140  K 4.3 4.1 3.7 3.7 4.8  CL 104 105 110 101 105  CO2 25 22 17* 19 21  GLUCOSE 143* 215* 144* 181* 166*  BUN 25* 24* 26* 32* 37*  CREATININE 1.04 0.96 1.11* 1.24* 1.27*  CALCIUM 9.1 7.9* 7.7* 9.4 9.8  MG  --  1.4* 1.2*  --  1.8  PHOS  --  4.1 3.5  --  4.1   Liver  Function Tests:  Recent Labs Lab 08/19/14 1117  AST 41*  ALT 25  ALKPHOS 76  BILITOT 0.7  PROT 5.2*  ALBUMIN 2.5*   No results found for this basename: LIPASE, AMYLASE,  in the last 168 hours No results found for this basename: AMMONIA,  in the last 168 hours CBC:  Recent Labs Lab 08/19/14 1117 08/20/14 0405 08/20/14 1500 08/21/14 0412 08/22/14 0555  WBC 13.0* 8.9 12.8* 17.0* 13.5*  NEUTROABS  --   --   --  15.6*  --   HGB 8.1* 7.3* 7.9* 8.2* 7.9*  HCT 23.9* 21.6* 23.6* 25.1* 24.1*  MCV 98.0 99.1 97.9 98.0 99.6  PLT 300 269 314 340 303   Cardiac Enzymes:  Recent Labs Lab 08/19/14 1113 08/19/14 1840 08/19/14 2245 08/20/14 0415  TROPONINI <0.30 0.77* 0.98* 1.13*   BNP (last 3 results)  Recent Labs  01/20/14 1353  PROBNP 5546.0*   CBG:  Recent Labs Lab 08/22/14 0011 08/22/14 0427 08/22/14 0642 08/22/14 0755 08/22/14 1110  GLUCAP 332* 151* 203* 228* 316*    Recent Results (from the past 240 hour(s))  MRSA PCR SCREENING     Status: None   Collection Time    08/19/14 12:57 PM      Result Value Ref Range  Status   MRSA by PCR NEGATIVE  NEGATIVE Final   Comment:            The GeneXpert MRSA Assay (FDA     approved for NASAL specimens     only), is one component of a     comprehensive MRSA colonization     surveillance program. It is not     intended to diagnose MRSA     infection nor to guide or     monitor treatment for     MRSA infections.     Studies: No results found.  Scheduled Meds: . antiseptic oral rinse  7 mL Mouth Rinse QID  . brimonidine  1 drop Right Eye BID  . chlorhexidine  15 mL Mouth Rinse BID  . dorzolamide-timolol  1 drop Left Eye BID  . heparin  5,000 Units Subcutaneous 3 times per day  . hydrochlorothiazide  25 mg Oral Daily  . insulin aspart  0-9 Units Subcutaneous 6 times per day  . latanoprost  1 drop Both Eyes QHS  . losartan  25 mg Oral Daily  . metoprolol  2.5 mg Intravenous 4 times per day  . pantoprazole  (PROTONIX) IV  40 mg Intravenous QHS   Continuous Infusions: . sodium chloride 10 mL/hr at 08/20/14 1614  . feeding supplement (VITAL AF 1.2 CAL) Stopped (08/20/14 1000)    Time spent: 35 minutes  [facility] [facility] Pager [phone]. If 7PM-7AM, please contact night-coverage at [email or web address], password [facility] 08/22/2014, 2:40 PM  LOS: 3 days

## 2014-08-22 NOTE — Progress Notes (Signed)
I stopped by yesterday morning to evaluate this patient, and did not leave a note at that time. She was sitting up, daughter present, sling applied. I reviewed with her briefly the events that had transpired as I understood them, and indicated that orthopedically Dr. Bettina Gavia plan is to proceed with continued nonoperative treatment of her right proximal humerus fracture.  The fingers were warm, brisk capillary refill, she can fully flex and extend all the digits. Tenderness about the proximal humerus. Sling adjusted  Assessment and Plan:  R proximal humerus fracture  Given events as they unfold, Dr. Tamera Punt as indicated to me that he believes the safest thing for her will be nonoperative management of this fracture  Sling RUE at all times.  Greatly appreciate critical care.  Dr. Tamera Punt will reassume patient's orthopedic care tomorrow.

## 2014-08-22 NOTE — Progress Notes (Signed)
At around 9 AM Pt became restless in bed and c/o nausea and pain this am.  Pt was mildly confused, and not speaking much but was alert and able to vocalize that she was in pain.  I administered nausea and pain medication to the patient and she expressed relief 30 minutes later.  The pt was no longer restless, however she sleeps sitting vertically due to spinal issues and osteoporosis.  Pt breathing was shallow but regular and nonlabored.  Her O2 saturation maintained 100% on 3L.  The pt at times removes oxygen, at which point she de-sats to 75%.  The pt's sister arrived to visit and expressed concern since pt in her mind looked better yesterday.  I notified the MD and the physician came up to assess the pt.  Pt's sister was concerned about pt sleeping sitting up, and I made several attempts to reposition the pt and get her to lay down but the pt kept reverting back to the upright position and stated that was where she was comfortable.  Pt is now resting quietly with call bell within reach.  Pt was able to tell me her name, birthday, which hospital she was at and why, and the year.  Will continue to monitor.

## 2014-08-22 NOTE — Progress Notes (Signed)
Progress note: Unable to see pt. Today secondary was sedated with pain meds.

## 2014-08-23 ENCOUNTER — Encounter: Payer: Self-pay | Admitting: Cardiovascular Disease

## 2014-08-23 LAB — CBC WITH DIFFERENTIAL/PLATELET
BASOS PCT: 0 % (ref 0–1)
Basophils Absolute: 0 10*3/uL (ref 0.0–0.1)
Eosinophils Absolute: 0 10*3/uL (ref 0.0–0.7)
Eosinophils Relative: 0 % (ref 0–5)
HEMATOCRIT: 25.8 % — AB (ref 36.0–46.0)
HEMOGLOBIN: 8.3 g/dL — AB (ref 12.0–15.0)
LYMPHS PCT: 3 % — AB (ref 12–46)
Lymphs Abs: 0.4 10*3/uL — ABNORMAL LOW (ref 0.7–4.0)
MCH: 32.8 pg (ref 26.0–34.0)
MCHC: 32.2 g/dL (ref 30.0–36.0)
MCV: 102 fL — ABNORMAL HIGH (ref 78.0–100.0)
MONO ABS: 0.7 10*3/uL (ref 0.1–1.0)
MONOS PCT: 6 % (ref 3–12)
Neutro Abs: 10.1 10*3/uL — ABNORMAL HIGH (ref 1.7–7.7)
Neutrophils Relative %: 91 % — ABNORMAL HIGH (ref 43–77)
Platelets: 334 10*3/uL (ref 150–400)
RBC: 2.53 MIL/uL — ABNORMAL LOW (ref 3.87–5.11)
RDW: 13.7 % (ref 11.5–15.5)
WBC: 11.1 10*3/uL — ABNORMAL HIGH (ref 4.0–10.5)

## 2014-08-23 LAB — BASIC METABOLIC PANEL
Anion gap: 12 (ref 5–15)
BUN: 42 mg/dL — ABNORMAL HIGH (ref 6–23)
CALCIUM: 10 mg/dL (ref 8.4–10.5)
CO2: 24 meq/L (ref 19–32)
CREATININE: 1.35 mg/dL — AB (ref 0.50–1.10)
Chloride: 107 mEq/L (ref 96–112)
GFR calc Af Amer: 44 mL/min — ABNORMAL LOW (ref >90)
GFR calc non Af Amer: 38 mL/min — ABNORMAL LOW (ref >90)
GLUCOSE: 161 mg/dL — AB (ref 70–99)
Potassium: 5 mEq/L (ref 3.7–5.3)
Sodium: 143 mEq/L (ref 137–147)

## 2014-08-23 LAB — BLOOD GAS, ARTERIAL
ACID-BASE DEFICIT: 0.5 mmol/L (ref 0.0–2.0)
Bicarbonate: 24.6 mEq/L — ABNORMAL HIGH (ref 20.0–24.0)
Drawn by: 313941
O2 CONTENT: 2 L/min
O2 SAT: 88.9 %
PATIENT TEMPERATURE: 98.6
TCO2: 26.1 mmol/L (ref 0–100)
pCO2 arterial: 47.4 mmHg — ABNORMAL HIGH (ref 35.0–45.0)
pH, Arterial: 7.335 — ABNORMAL LOW (ref 7.350–7.450)
pO2, Arterial: 59 mmHg — ABNORMAL LOW (ref 80.0–100.0)

## 2014-08-23 LAB — AMMONIA: Ammonia: 19 umol/L (ref 11–60)

## 2014-08-23 LAB — GLUCOSE, CAPILLARY
GLUCOSE-CAPILLARY: 125 mg/dL — AB (ref 70–99)
Glucose-Capillary: 143 mg/dL — ABNORMAL HIGH (ref 70–99)
Glucose-Capillary: 150 mg/dL — ABNORMAL HIGH (ref 70–99)
Glucose-Capillary: 135 mg/dL — ABNORMAL HIGH (ref 70–99)

## 2014-08-23 LAB — PRO B NATRIURETIC PEPTIDE: Pro B Natriuretic peptide (BNP): 15994 pg/mL — ABNORMAL HIGH (ref 0–125)

## 2014-08-23 MED ORDER — VANCOMYCIN HCL IN DEXTROSE 1-5 GM/200ML-% IV SOLN
1000.0000 mg | Freq: Once | INTRAVENOUS | Status: AC
  Administered 2014-08-23: 1000 mg via INTRAVENOUS
  Filled 2014-08-23 (×2): qty 200

## 2014-08-23 MED ORDER — VANCOMYCIN HCL IN DEXTROSE 750-5 MG/150ML-% IV SOLN
750.0000 mg | INTRAVENOUS | Status: DC
  Administered 2014-08-24: 750 mg via INTRAVENOUS
  Filled 2014-08-23 (×2): qty 150

## 2014-08-23 MED ORDER — GLUCERNA SHAKE PO LIQD
237.0000 mL | Freq: Two times a day (BID) | ORAL | Status: DC
  Administered 2014-08-24 – 2014-08-25 (×3): 237 mL via ORAL

## 2014-08-23 MED ORDER — PIPERACILLIN-TAZOBACTAM 3.375 G IVPB
3.3750 g | Freq: Three times a day (TID) | INTRAVENOUS | Status: DC
  Administered 2014-08-23 – 2014-08-25 (×6): 3.375 g via INTRAVENOUS
  Filled 2014-08-23 (×9): qty 50

## 2014-08-23 MED FILL — Medication: Qty: 1 | Status: AC

## 2014-08-23 NOTE — Clinical Social Work Psychosocial (Signed)
Clinical Social Work Department BRIEF PSYCHOSOCIAL ASSESSMENT 08/23/2014  Patient:  Brandy Short, Brandy Short     Account Number:  [id number]     Admit date:  08/19/2014  Clinical Social Worker:  Domenica Reamer, [place]  Date/Time:  08/23/2014 02:40 PM  Referred by:  Physician  Date Referred:  08/23/2014 Referred for  SNF Placement   Other Referral:   Interview type:  Family Other interview type:    PSYCHOSOCIAL DATA Living Status:  ALONE Admitted from facility:   Level of care:   Primary support name:  Brandy Short Primary support relationship to patient:  SIBLING Degree of support available:   Patients sister expressed high level of support for sister and was adamant that sister be placed in [place] to make visiting easy.  CSW also observed two relatives/friends at bedside.    CURRENT CONCERNS Current Concerns  Post-Acute Placement   Other Concerns:   none    SOCIAL WORK ASSESSMENT / PLAN CSW spoke to family about SNF placement and patients family was agreeable to [facility]- preference for [facility], [facility], or [facility].  CSW explained SNF placement process and informed patient that a CSW would provide bed options for the patient to choose from.   Assessment/plan status:  Psychosocial Support/Ongoing Assessment of Needs Other assessment/ plan:   FL2, PASAR   Information/referral to community resources:   [facility]    PATIENT'S/FAMILY'S RESPONSE TO PLAN OF CARE: Patients family is agreeable to SNF though reported that the patients mother had died in a nursing facility which makes the decision for the patient to go to a facility emotional.  Patients family was optimistic about their ability to support the patient and aid in patients recovery.       Domenica Reamer, [place] Social Worker [phone]

## 2014-08-23 NOTE — Progress Notes (Signed)
NT alerted RN that foley was out and laying in chair.  Bulb not inflated.  Unclear how long has been out.  Pt unaware.  Will trial without catheter and monitor closely.

## 2014-08-23 NOTE — Progress Notes (Addendum)
[facility] PROGRESS NOTE  ANALEAH BRAME IRW:[id number] DOB: 1941/02/13 DOA: 08/19/2014 PCP: Alesia Richards, MD  Summary:   73 year old white female presented after a fall resulting in right proximal humerus fracture.had a scalene block and subsequently hada cute respiratory failure requiring intubation.  surgery was canceled and orthopedics performed a closed reduction of the fracture. Patient had diffuse ST depression and positive troponins. Underwent urgent cardiac catheterization which showed no significant obstructive coronary disease, and normal ejection fraction. Patient was subsequently extubated and transferred to hospitalists on 08/22/2014.  Assessment/Plan:  Principal Problem:   Acute respiratory failure Per nursing staff, desaturates into the 80s when off oxygen. Remains sedated. Will check ABG.  chest x-ray shows possible pneumonia. Will cover for hospital acquired/aspiration pneumonia  Active Problems: HCAP v. Aspiration pneumonia:  Yesterday's chest x-ray shows subtle upper infiltrates.  Will start vancomycin and Zosyn. May be contributing to altered level of consciousness.  Encephalopathy. Had recent TSH, B12, folate all ok.  Has not received sedation. Last dose was 12.5  mg 16 hours ago.  Ammonia level normal. Urinalysis shows no infection. Limit sedating medications as able. Rule out CO2 narcosis. Patient's friend reports that even at baseline she falls asleep frequently during the day and slept through the entire wedding recently. 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 Will need outpatient sleep study when medically stable.  Humerus fracture:  to remain in splint at all times. Nonoperative management due to medical issues.  Will need skilled nursing facility at discharge. Lives alone.    Troponin level elevated: Type 2 MI  HTN: medications resumed.  DM: better on Levemir and increased NovoLog sliding scale.  Anemia: monitor. Recent anemia panel WNL.  Code  Status:  full Family Communication:  Sister And friend at bedside Disposition Plan:  SNF  Consultants:  Ortho  PCCM  cardiology  Procedures:   Cardiac cath  Scalene block  ETT  Left IJ CVL   HPI/Subjective: When asked, admits to dyspnea  Objective: Filed Vitals:   08/23/14 0526  BP: 153/49  Pulse: 99  Temp: 98 F (36.7 C)  Resp: 18    Intake/Output Summary (Last 24 hours) at 08/23/14 1049 Last data filed at 08/23/14 0533  Gross per 24 hour  Intake      0 ml  Output    625 ml  Net   -625 ml   Filed Weights   08/21/14 0354 08/21/14 1607 08/22/14 0442  Weight: 54.749 kg (120 lb 11.2 oz) 54.749 kg (120 lb 11.2 oz) 55.7 kg (122 lb 12.7 oz)    Exam:   General:  In chair. Slightly more alert today, but drifts off to sleep and requires much coaxing to answer questions.  Cardiovascular: RRR without MGR  Respiratory: diminished without WRR  Abdomen: S, NT, ND  Ext: right arm in sling. No CCE  Basic Metabolic Panel:  Recent Labs Lab 08/19/14 0822 08/19/14 1117 08/20/14 0405 08/21/14 0412 08/22/14 0555 08/23/14 0418  NA 142 141 143 139 140 143  K 4.3 4.1 3.7 3.7 4.8 5.0  CL 104 105 110 101 105 107  CO2 25 22 17* 19 21 24   GLUCOSE 143* 215* 144* 181* 166* 161*  BUN 25* 24* 26* 32* 37* 42*  CREATININE 1.04 0.96 1.11* 1.24* 1.27* 1.35*  CALCIUM 9.1 7.9* 7.7* 9.4 9.8 10.0  MG  --  1.4* 1.2*  --  1.8  --   PHOS  --  4.1 3.5  --  4.1  --    Liver Function  Tests:  Recent Labs Lab 08/19/14 1117  AST 41*  ALT 25  ALKPHOS 76  BILITOT 0.7  PROT 5.2*  ALBUMIN 2.5*   No results found for this basename: LIPASE, AMYLASE,  in the last 168 hours  Recent Labs Lab 08/23/14 0418  AMMONIA 19   CBC:  Recent Labs Lab 08/20/14 0405 08/20/14 1500 08/21/14 0412 08/22/14 0555 08/23/14 0418  WBC 8.9 12.8* 17.0* 13.5* 11.1*  NEUTROABS  --   --  15.6*  --  10.1*  HGB 7.3* 7.9* 8.2* 7.9* 8.3*  HCT 21.6* 23.6* 25.1* 24.1* 25.8*  MCV 99.1 97.9 98.0  99.6 102.0*  PLT 269 314 340 303 334   Cardiac Enzymes:  Recent Labs Lab 08/19/14 1113 08/19/14 1840 08/19/14 2245 08/20/14 0415  TROPONINI <0.30 0.77* 0.98* 1.13*   BNP (last 3 results)  Recent Labs  01/20/14 1353  PROBNP 5546.0*   CBG:  Recent Labs Lab 08/22/14 0755 08/22/14 1110 08/22/14 1608 08/22/14 2113 08/23/14 0615  GLUCAP 228* 316* 197* 190* 143*    Recent Results (from the past 240 hour(s))  MRSA PCR SCREENING     Status: None   Collection Time    08/19/14 12:57 PM      Result Value Ref Range Status   MRSA by PCR NEGATIVE  NEGATIVE Final   Comment:            The GeneXpert MRSA Assay (FDA     approved for NASAL specimens     only), is one component of a     comprehensive MRSA colonization     surveillance program. It is not     intended to diagnose MRSA     infection nor to guide or     monitor treatment for     MRSA infections.     Studies: Dg Chest Port 1 View  08/22/2014   CLINICAL DATA:  Cough and confusion.  EXAM: PORTABLE CHEST - 1 VIEW  COMPARISON:  Chest x-ray 08/20/2014.  As CT chest 01/20/2014.  FINDINGS: Interim removal of endotracheal tube, NG tube, and IJ line. Mediastinum and hilar structures are normal. Mild bilateral upper lobe infiltrates noted consistent with pneumonia. Small left base infiltrate cannot be excluded. Small left pleural effusion. No pneumothorax. Mild cardiomegaly with normal pulmonary vascularity. No acute bony abnormality.  IMPRESSION: 1. Interim removal of endotracheal tube, NG tube, and IJ line. 2. Mild bilateral upper lobe and probable left lower lobe infiltrates. 3. Small left pleural effusion.   Electronically Signed   By: Blanding   On: 08/22/2014 18:52    Scheduled Meds: . antiseptic oral rinse  7 mL Mouth Rinse QID  . bisoprolol  10 mg Oral Daily  . brimonidine  1 drop Right Eye BID  . chlorhexidine  15 mL Mouth Rinse BID  . Difluprednate  1 drop Ophthalmic BID  . dorzolamide-timolol  1 drop Left  Eye BID  . heparin  5,000 Units Subcutaneous 3 times per day  . insulin aspart  0-15 Units Subcutaneous TID WC  . insulin aspart  0-5 Units Subcutaneous QHS  . insulin detemir  8 Units Subcutaneous Daily  . latanoprost  1 drop Both Eyes QHS  . losartan  100 mg Oral Daily   Continuous Infusions: . sodium chloride 10 mL/hr at 08/20/14 1614    Time spent: 35 minutes  [facility] Pager [phone]. If 7PM-7AM, please contact night-coverage at [email or web address], password [facility] 08/23/2014, 10:49 AM  LOS: 4 days

## 2014-08-23 NOTE — Progress Notes (Signed)
   PATIENT ID: Brandy Short   4 Days Post-Op Procedure(s) (LRB): LEFT HEART CATHETERIZATION WITH CORONARY ANGIOGRAM (N/A)  Subjective: Sitting up in bed.  Responsive, but sluggish.  Says shoulder hurts.  Objective:  Filed Vitals:   08/23/14 0526  BP: 153/49  Pulse: 99  Temp: 98 F (36.7 C)  Resp: 18     Sitting up in bed, rocking.  R arm in sling.  Labs:   Recent Labs  08/20/14 1500 08/21/14 0412 08/22/14 0555 08/23/14 0418  HGB 7.9* 8.2* 7.9* 8.3*   Recent Labs  08/22/14 0555 08/23/14 0418  WBC 13.5* 11.1*  RBC 2.42* 2.53*  HCT 24.1* 25.8*  PLT 303 334   Recent Labs  08/22/14 0555 08/23/14 0418  NA 140 143  K 4.8 5.0  CL 105 107  CO2 21 24  BUN 37* 42*  CREATININE 1.27* 1.35*  GLUCOSE 166* 161*  CALCIUM 9.8 10.0    Assessment and Plan: R proximal humerus fracture: s/p closed reduction, surgery cancelled secondary to medical issues.   Appreciate medical management Will follow along.

## 2014-08-23 NOTE — Progress Notes (Signed)
ANTIBIOTIC CONSULT NOTE - INITIAL  Pharmacy Consult for Vancomycin Indication: HCAP vs aspiration PNA  Allergies  Allergen Reactions  . Actonel [Risedronate Sodium]     Dyspepsia  . Codeine Nausea Only and Other (See Comments)    Nightmares  . Hyosol-Sl [Hyoscyamine] Other (See Comments)    Dry mouth  . Metformin And Related Diarrhea  . Phenobarbital Diarrhea  . Ace Inhibitors Rash  . Dilaudid [Hydromorphone Hcl] Rash  . Quinine Derivatives Rash  . Sulfur Rash  . Tetracyclines & Related Rash    Skin peeled off     Patient Measurements: Height: 5\' 5"  (165.1 cm) Weight: 122 lb 12.7 oz (55.7 kg) IBW/kg (Calculated) : 57 Adjusted Body Weight:    Vital Signs: Temp: 98 F (36.7 C) (08/31 0526) Temp src: Oral (08/31 0526) BP: 153/49 mmHg (08/31 0526) Pulse Rate: 58 (08/31 1009) Intake/Output from previous day: 08/30 0701 - 08/31 0700 In: 0  Out: 625 [Urine:625] Intake/Output from this shift:    Labs:  Recent Labs  08/21/14 0412 08/22/14 0555 08/23/14 0418  WBC 17.0* 13.5* 11.1*  HGB 8.2* 7.9* 8.3*  PLT 340 303 334  CREATININE 1.24* 1.27* 1.35*   Estimated Creatinine Clearance: 32.6 ml/min (by C-G formula based on Cr of 1.35). No results found for this basename: VANCOTROUGH, VANCOPEAK, VANCORANDOM, GENTTROUGH, GENTPEAK, GENTRANDOM, TOBRATROUGH, TOBRAPEAK, TOBRARND, AMIKACINPEAK, AMIKACINTROU, AMIKACIN,  in the last 72 hours   Microbiology:   Medical History: Past Medical History  Diagnosis Date  . Depression   . Diverticulosis   . Adenomatous colon polyp   . Asthma   . Hypertension   . Mixed hyperlipidemia   . Type II or unspecified type diabetes mellitus without mention of complication, not stated as uncontrolled   . Anemia   . GERD (gastroesophageal reflux disease)   . Vitamin D deficiency   . Glaucoma   . Diverticulitis   . Heart murmur   . Pneumonia   . Cancer     basal cell carcinoma of the skin    Medications:  Prescriptions prior to  admission  Medication Sig Dispense Refill  . acidophilus (RISAQUAD) CAPS Take 1 capsule by mouth daily.       Marland Kitchen albuterol (PROVENTIL HFA;VENTOLIN HFA) 108 (90 BASE) MCG/ACT inhaler Inhale 1 puff into the lungs 2 (two) times daily as needed for wheezing or shortness of breath.      . betamethasone dipropionate (DIPROLENE) 0.05 % cream Apply 1 application topically daily. Uses for skin cancer      . bisoprolol (ZEBETA) 10 MG tablet 5-10 mg at bedtime. Take 1/2 to 1 tablet daily as directed for BP      . brimonidine (ALPHAGAN) 0.15 % ophthalmic solution Place 1 drop into the right eye 2 (two) times daily at 10 AM and 5 PM.       . Calcium Carbonate (CALCIUM 600 PO) Take 1 tablet by mouth daily.      . clobetasol (TEMOVATE) 0.05 % external solution Apply 1 application topically 2 (two) times daily.       . Cyanocobalamin (VITAMIN B 12 PO) Take 1,000 mcg by mouth daily.      . Difluprednate (DUREZOL) 0.05 % EMUL Apply 1 drop to eye 2 (two) times daily. 1 ggt to surgical eye BID      . dorzolamide-timolol (COSOPT) 22.3-6.8 MG/ML ophthalmic solution Place 1 drop into the left eye 2 (two) times daily.       . hydrochlorothiazide (HYDRODIURIL) 25 MG tablet Take 25  mg by mouth daily as needed (fluid retention). Takes prn      . HYDROcodone-acetaminophen (NORCO/VICODIN) 5-325 MG per tablet Take 1 tablet by mouth every 6 (six) hours as needed for moderate pain or severe pain.  15 tablet  0  . Insulin Detemir (LEVEMIR) 100 UNIT/ML Pen Inject 50 units subcutaneous daily or as directed  45 mL  11  . losartan (COZAAR) 100 MG tablet Take 1 tablet by mouth  every day  90 tablet  1  . LUMIGAN 0.01 % SOLN Place 1 drop into both eyes at bedtime.       . metFORMIN (GLUCOPHAGE-XR) 500 MG 24 hr tablet Take 1,000 mg by mouth 2 (two) times daily.      . [DISCONTINUED] bisoprolol (ZEBETA) 10 MG tablet Take 1/2 to 1 tablet daily as directed for BP  90 tablet  99  . ONE TOUCH ULTRA TEST test strip CHECK BLOOD SUGAR EVERY DAY  TO 3 TIMES A DAY  300 each  6  . Red Yeast Rice 600 MG CAPS Take 600 mg by mouth daily.         Assessment:  73 yo female admitted on 8/27 s/p fall with R proximal humerus fracture. She went to OR and arrested after nerve block was given.   Anticoag: SQ heparin. Hgb low 8.3. Plts stable  ID: PNA. Afebrile. WBC 11.1 down. Infiltrates on CXR. Vanco 8/31>> Zosyn 8/31>>  CV: ST depression w/ + Tp. s/p cath w/ Moderate coronary calcification without significant CAD. EF= 55-60% on Echo. BP 153/49, HR 58 Meds: bisoprolol, losartan  Endo: glucose 97-181 on SSI, Levemir. CBG 316>>143 all downward trend.  Renal: SCr 1.35 trending up. K=5.   Neuro: encephalopathy  Heme: Hgb 8.3 stable  Pulm: s/p respiratory arrest in the OR. Extubated on RA on 8/28  Neuro:   PTA meds: metformin, losartan, hctz  BP: SQ heparin, MC   Goal of Therapy:  Vancomycin trough level 15-20 mcg/ml  Plan:  Vancomycin 1g IV x 1 then 750mg  IV q24h Zosyn 3.375g IV q8hr dose ok Vanco trough after 3-5 doses at steady state.  Brandy Short, PharmD, BCPS Clinical Staff Pharmacist Pager [phone]  Brandy Short 08/23/2014,11:27 AM

## 2014-08-23 NOTE — Progress Notes (Signed)
Physical Therapy Treatment Patient Details Name: Brandy Short MRN: [id number] DOB: 11/22/1941 Today's Date: 08/23/2014    History of Present Illness Pt is a 73 y.o. female with a history of DM and hypertension, who is admitted to the hospital with fracture of her right humerus after fall at home (x5 falls in past 2 months per RN). Apparently, prior to undergoing ORIF surgery she had a respiratory arrest in the PACU and was intubated. ECG showed diffuse ST segment depression. She was then referred for an urgent cardiac catheterization on 08/19/14. Pt was extubated on 08/20/14.    PT Comments    **Pt very lethargic throughout tx. She was minimally responsive to questions and to instructions. She sat on EOB x 10 min with significantly slouched posture. Mod assist to pivot to recliner. SNF recommended. Family present and are concerned about her lethargy.  *  Follow Up Recommendations  Supervision/Assistance - 24 hour;SNF     Equipment Recommendations       Recommendations for Other Services       Precautions / Restrictions Precautions Precautions: Fall Precaution Comments: Per RN x5 falls in past 2 months. Required Braces or Orthoses: Sling Restrictions Weight Bearing Restrictions: Yes Other Position/Activity Restrictions: Requested clarification of WB status through RUE.    Mobility  Bed Mobility Overal bed mobility: Needs Assistance Bed Mobility: Supine to Sit     Supine to sit: Max assist     General bed mobility comments: Pt requires increased time and direct commands to initiate movement. Assist for trunk support and to swing LE's off EOB during transfer to EOB.   Transfers Overall transfer level: Needs assistance Equipment used: None Transfers: Stand Pivot Transfers;Sit to/from Stand Sit to Stand: Mod assist;From elevated surface Stand pivot transfers: Mod assist       General transfer comment: VC's for hand placement on seated surface for safety. Assist for  steadying herself prior to pivotal steps to recliner.   Ambulation/Gait Ambulation/Gait assistance: Mod assist Ambulation Distance (Feet): 2 Feet   Gait Pattern/deviations: Step-to pattern;Decreased step length - left;Decreased step length - right;Trunk flexed Gait velocity: Decreased   General Gait Details: mod A for balance, cues for sequencing and technique   Stairs            Wheelchair Mobility    Modified Rankin (Stroke Patients Only)       Balance     Sitting balance-Leahy Scale: Poor Sitting balance - Comments: Assist required initially, then able to sit on EOB without assist but with significant neck flexion; Pt sat on EOB x 10 minutes   Standing balance support: No upper extremity supported Standing balance-Leahy Scale: Poor Standing balance comment: min to mod A in standing                     Cognition Arousal/Alertness: Lethargic Behavior During Therapy: Flat affect Overall Cognitive Status: Impaired/Different from baseline Area of Impairment: Orientation;Attention;Following commands;Awareness       Following Commands: Follows one step commands inconsistently       General Comments: very lethargic, minimal verbal response to questions    Exercises General Exercises - Lower Extremity Long Arc Quad: AAROM;Both;15 reps;Seated    General Comments        Pertinent Vitals/Pain Pain Assessment: Faces Faces Pain Scale: Hurts even more Pain Location: R shoulder with mobility Pain Intervention(s): Repositioned;Monitored during session (pt not receiving pain meds due to lethargy)    Home Living  Prior Function            PT Goals (current goals can now be found in the care plan section) Acute Rehab PT Goals Patient Stated Goal: None stated PT Goal Formulation: With patient Time For Goal Achievement: 09/03/14 Potential to Achieve Goals: Fair Progress towards PT goals: Not progressing toward goals -  comment (lethargy limiting participation/progress with PT)    Frequency  Min 3X/week    PT Plan Current plan remains appropriate    Co-evaluation             End of Session Equipment Utilized During Treatment: Gait belt;Oxygen Activity Tolerance: Patient limited by lethargy Patient left: with call bell/phone within reach;in chair;with family/visitor present     Time: 7408-1448 PT Time Calculation (min): 32 min  Charges:  $Therapeutic Activity: 23-37 mins                    G Codes:      Brandy Short 08/23/2014, 9:58 AM [phone]

## 2014-08-23 NOTE — Evaluation (Signed)
Occupational Therapy Evaluation Patient Details Name: Brandy Short MRN: [id number] DOB: 06/21/1941 Today's Date: 08/23/2014    History of Present Illness Pt is a 73 y.o. female with a history of DM and hypertension, who is admitted to the hospital with fracture of her right humerus after fall at home (x5 falls in past 2 months per RN). Apparently, prior to undergoing ORIF surgery she had a respiratory arrest in the PACU and was intubated. ECG showed diffuse ST segment depression. She was then referred for an urgent cardiac catheterization on 08/19/14. Pt was extubated on 08/20/14.   Clinical Impression   Pt with significant decline in funciton and safety with ADLs and ADL mobility. Pt's family states that she was independent with ADLs, mobility and home mgt PTA. Pt required hand over hand assist with grooming and self feeding. Pt required repositioning in recliner x 3 during seesion to hold head/trunk upright. Pt lethargic and not following commands consistently. Initiated sling education with family for R UE poition/safety. Pt would benefit from acute OR services to address impairments to increase level of function and safety    Follow Up Recommendations  SNF;Supervision/Assistance - 24 hour    Equipment Recommendations  Other (comment) (TBD at next venue of care)    Recommendations for Other Services       Precautions / Restrictions Precautions Precautions: Fall Precaution Comments: Per RN x5 falls in past 2 months. Required Braces or Orthoses: Sling at all times Restrictions Weight Bearing Restrictions: Yes Other Position/Activity Restrictions: Requested clarification of WB status through RUE.      Mobility Bed Mobility Overal bed mobility: Needs Assistance Bed Mobility: Supine to Sit     Supine to sit: Max assist     General bed mobility comments: pt up in recliner  Transfers Overall transfer level: Needs assistance Equipment used: None Transfers: Stand Pivot  Transfers;Sit to/from Stand Sit to Stand: Mod assist;From elevated surface Stand pivot transfers: Mod assist       General transfer comment: Pt unable, too lethargic. Per PT pt ins mod A with transfers    Balance     Sitting balance-Leahy Scale: Poor Sitting balance - Comments: Max A for repositioning seated in recliner. Head/neck/trunk in flexed postural position   Standing balance support: unable to assess                              ADL Overall ADL's : Needs assistance/impaired Eating/Feeding: Maximal assistance;Sitting   Grooming: Wash/dry face;Sitting;Maximal assistance   Upper Body Bathing: Total assistance   Lower Body Bathing: Total assistance   Upper Body Dressing : Total assistance   Lower Body Dressing: Total assistance     Toilet Transfer Details (indicate cue type and reason): pt unale due o lethergy, decreased cognition. Per PT pt is mod A with transfers [place] and Hygiene: Total assistance         General ADL Comments: Pt required hand over hand assist with grooming and self feeding. Pt required repositioning in recliner x 3 during seesion to hold head/trunk upright. Pt lethargin and not following commands consistently. Initiated sling education with family for R UE poition/safety     Vision  wears glasses                   Perception Perception Perception Tested?: No   Praxis Praxis Praxis tested?: Not tested    Pertinent Vitals/Pain Pain Assessment: Faces Faces Pain Scale: Hurts  even more Pain Location: R shoulder Pain Intervention(s): Monitored during session;Limited activity within patient's tolerance;Repositioned     Hand Dominance Right   Extremity/Trunk Assessment Upper Extremity Assessment Upper Extremity Assessment: RUE deficits/detail RUE Deficits / Details: Decreased strength and AROM consistent with humeral fracture.  NWB during session as no clarification of WB status was received.   RUE: Unable to fully assess due to immobilization;Unable to fully assess due to pain   Lower Extremity Assessment Lower Extremity Assessment: Generalized weakness   Cervical / Trunk Assessment Cervical / Trunk Assessment: Kyphotic   Communication Communication Communication: Expressive difficulties   Cognition Arousal/Alertness: Lethargic Behavior During Therapy: Flat affect Overall Cognitive Status: Impaired/Different from baseline Area of Impairment: Orientation;Attention;Following commands;Awareness       Following Commands: Follows one step commands inconsistently       General Comments: very lethargic, minimal verbal response to questions   General Comments   Pt pleasant, but lethargic and inconsistent following simple commands. Max A to initiate tasks                 Home Living Family/patient expects to be discharged to:: Skilled nursing facility Living Arrangements: Alone Available Help at Discharge: Family;Available PRN/intermittently                                    Prior Functioning/Environment Level of Independence: Independent        Comments: Pt has been living alone and reports that she is independent with her ADL's and ambulation. Pt also admits to falling at home often. Could not tell me details about her living situation.     OT Diagnosis: Generalized weakness;Acute pain   OT Problem List: Decreased strength;Decreased knowledge of use of DME or AE;Impaired UE functional use;Decreased cognition;Decreased activity tolerance;Pain;Impaired balance (sitting and/or standing);Decreased range of motion;Decreased knowledge of precautions   OT Treatment/Interventions: Self-care/ADL training;Therapeutic exercise;Patient/family education;Neuromuscular education;Balance training;Therapeutic activities;DME and/or AE instruction    OT Goals(Current goals can be found in the care plan section) Acute Rehab OT Goals Patient Stated Goal: None  stated OT Goal Formulation: With patient/family Time For Goal Achievement: 09/22/14 Potential to Achieve Goals: Good ADL Goals Pt Will Perform Eating: with mod assist;sitting Pt Will Perform Grooming: with mod assist;sitting Pt Will Perform Upper Body Bathing: with max assist;with mod assist;sitting Pt Will Perform Upper Body Dressing: with max assist;with mod assist;sitting Pt Will Transfer to Toilet: with min assist;bedside commode Additional ADL Goal #1: Pt/family education for proper sling wear and positioning for R UE  OT Frequency: Min 2X/week   Barriers to D/C: Decreased caregiver support                        End of Session    Activity Tolerance:  Poor Patient left: in chair;with call bell/phone within reach;with family/visitor present   Time: 4034-7425 OT Time Calculation (min): 25 min Charges:  OT General Charges $OT Visit: 1 Procedure OT Evaluation $Initial OT Evaluation Tier I: 1 Procedure OT Treatments $Therapeutic Activity: 8-22 mins G-Codes:    Britt Bottom 08/23/2014, 12:30 PM

## 2014-08-23 NOTE — Progress Notes (Signed)
NUTRITION FOLLOW UP  INTERVENTION: Glucerna Shake po BID, each supplement provides 220 kcal and 10 grams of protein RD to follow for nutrition care plan  NUTRITION DIAGNOSIS: Inadequate oral intake now related to decreased alertness as evidenced by PO intake 5%, ongoing  Goal: Pt to meet >/= 90% of their estimated nutrition needs, unmet  Monitor:  PO & supplemental intake, weight, labs, I/O's  ASSESSMENT: Pt admitted for ORIF of right humerus fx. Pt had respiratory arrest and was intubated. Pt did have closed reduction of fx but did not have ORIF.   Patient s/p procedure 8/27: CARDIAC CATHETERIZATION  Patient extubated 8/28.  Transferred to 2W-Cardiac from [facility] 8/29.  Patient's diet advanced to Clear Liquids 8/28, Soft 8/30.  Noted patient with decreased alertness and lethargy.  PO intake very poor at 0-5% per flowsheet records.  Would benefit from addition of oral nutrition supplements.  RD to order.  Height: Ht Readings from Last 1 Encounters:  08/21/14 5\' 5"  (1.651 m)    Weight: Wt Readings from Last 1 Encounters:  08/22/14 122 lb 12.7 oz (55.7 kg)    BMI:  Body mass index is 20.43 kg/(m^2).  Re-estimated Needs: Kcal: 1400-1600 Protein: 70-80 gm Fluid: >/= 1.5 L  Skin: ecchymosis  Diet Order: Soft   Intake/Output Summary (Last 24 hours) at 08/23/14 1504 Last data filed at 08/23/14 1300  Gross per 24 hour  Intake     60 ml  Output    525 ml  Net   -465 ml    Labs:   Recent Labs Lab 08/19/14 1117 08/20/14 0405 08/21/14 0412 08/22/14 0555 08/23/14 0418  NA 141 143 139 140 143  K 4.1 3.7 3.7 4.8 5.0  CL 105 110 101 105 107  CO2 22 17* 19 21 24   BUN 24* 26* 32* 37* 42*  CREATININE 0.96 1.11* 1.24* 1.27* 1.35*  CALCIUM 7.9* 7.7* 9.4 9.8 10.0  MG 1.4* 1.2*  --  1.8  --   PHOS 4.1 3.5  --  4.1  --   GLUCOSE 215* 144* 181* 166* 161*    CBG (last 3)   Recent Labs  08/22/14 2113 08/23/14 0615 08/23/14 1132  GLUCAP 190* 143*  150*    Scheduled Meds: . antiseptic oral rinse  7 mL Mouth Rinse QID  . bisoprolol  10 mg Oral Daily  . brimonidine  1 drop Right Eye BID  . chlorhexidine  15 mL Mouth Rinse BID  . Difluprednate  1 drop Ophthalmic BID  . dorzolamide-timolol  1 drop Left Eye BID  . heparin  5,000 Units Subcutaneous 3 times per day  . insulin aspart  0-15 Units Subcutaneous TID WC  . insulin aspart  0-5 Units Subcutaneous QHS  . insulin detemir  8 Units Subcutaneous Daily  . latanoprost  1 drop Both Eyes QHS  . losartan  100 mg Oral Daily  . piperacillin-tazobactam (ZOSYN)  IV  3.375 g Intravenous 3 times per day  . [START ON 08/24/2014] vancomycin  750 mg Intravenous Q24H    Continuous Infusions: . sodium chloride 10 mL/hr at 08/20/14 1614    Past Medical History  Diagnosis Date  . Depression   . Diverticulosis   . Adenomatous colon polyp   . Asthma   . Hypertension   . Mixed hyperlipidemia   . Type II or unspecified type diabetes mellitus without mention of complication, not stated as uncontrolled   . Anemia   . GERD (gastroesophageal reflux disease)   .  Vitamin D deficiency   . Glaucoma   . Diverticulitis   . Heart murmur   . Pneumonia   . Cancer     basal cell carcinoma of the skin    Past Surgical History  Procedure Laterality Date  . Abdominal hysterectomy    . Breast biopsy    . Cyst removed from lower spine    . Lithotripsy    . Eye surgery Bilateral 09/2003 Dr. Katy Fitch     Glaucoma    Arthur Holms, RD, LDN Pager #: [phone] After-Hours Pager #: [phone]

## 2014-08-24 ENCOUNTER — Inpatient Hospital Stay (HOSPITAL_COMMUNITY): Payer: Medicare Other

## 2014-08-24 LAB — GLUCOSE, CAPILLARY
GLUCOSE-CAPILLARY: 117 mg/dL — AB (ref 70–99)
GLUCOSE-CAPILLARY: 137 mg/dL — AB (ref 70–99)
GLUCOSE-CAPILLARY: 86 mg/dL (ref 70–99)
Glucose-Capillary: 191 mg/dL — ABNORMAL HIGH (ref 70–99)

## 2014-08-24 LAB — CBC
HCT: 25.4 % — ABNORMAL LOW (ref 36.0–46.0)
HEMOGLOBIN: 8.3 g/dL — AB (ref 12.0–15.0)
MCH: 33.2 pg (ref 26.0–34.0)
MCHC: 32.7 g/dL (ref 30.0–36.0)
MCV: 101.6 fL — ABNORMAL HIGH (ref 78.0–100.0)
Platelets: 328 10*3/uL (ref 150–400)
RBC: 2.5 MIL/uL — AB (ref 3.87–5.11)
RDW: 13.7 % (ref 11.5–15.5)
WBC: 8.1 10*3/uL (ref 4.0–10.5)

## 2014-08-24 LAB — BLOOD GAS, ARTERIAL
Acid-Base Excess: 0.3 mmol/L (ref 0.0–2.0)
Bicarbonate: 25 mEq/L — ABNORMAL HIGH (ref 20.0–24.0)
DRAWN BY: 23404
O2 CONTENT: 2 L/min
O2 Saturation: 97.7 %
PATIENT TEMPERATURE: 98.3
PCO2 ART: 43.8 mmHg (ref 35.0–45.0)
TCO2: 26.3 mmol/L (ref 0–100)
pH, Arterial: 7.374 (ref 7.350–7.450)
pO2, Arterial: 80.3 mmHg (ref 80.0–100.0)

## 2014-08-24 LAB — BASIC METABOLIC PANEL
Anion gap: 14 (ref 5–15)
BUN: 46 mg/dL — ABNORMAL HIGH (ref 6–23)
CALCIUM: 9 mg/dL (ref 8.4–10.5)
CO2: 25 meq/L (ref 19–32)
Chloride: 104 mEq/L (ref 96–112)
Creatinine, Ser: 1.45 mg/dL — ABNORMAL HIGH (ref 0.50–1.10)
GFR calc Af Amer: 40 mL/min — ABNORMAL LOW (ref >90)
GFR calc non Af Amer: 35 mL/min — ABNORMAL LOW (ref >90)
Glucose, Bld: 191 mg/dL — ABNORMAL HIGH (ref 70–99)
Potassium: 4.1 mEq/L (ref 3.7–5.3)
SODIUM: 143 meq/L (ref 137–147)

## 2014-08-24 NOTE — Progress Notes (Signed)
08/24/2014 3:25 AM Progress Notes Patient was encouraged to get on the bedside commode and voided about 200 mL of urine. A post-void bladder scan showed the highest amount to be about 166 cc of urine in the bladder. Will continue to monitor the patient. Brandy Short

## 2014-08-24 NOTE — Clinical Social Work Note (Signed)
CSW called [employer] and confirmed that patient could come to [facility] upon discharge.  Hoyle Sauer with [facility] stated that someone with [facility] would come to the hospital tomorrow to do paperwork with the family.  Domenica Reamer, [place] Social Worker [phone]

## 2014-08-24 NOTE — Progress Notes (Signed)
08/24/2014 03:00 AM Progress Notes The patient's blood pressure was 179/55 and she was given 10 mg Hydralazine IV. About 30 minutes after the medication was given the patient's blood pressure dropped to 165/48.  Patient is not showing any symptoms of hypotension. Will continue to monitor the patient. Brandy Short

## 2014-08-24 NOTE — Progress Notes (Signed)
08/24/2014 2:31 AM Progress notes Patient has not voided since 18:30.  A bladder scan showed about 270 cc of urine in the bladder. Patient was asked if needed to void and said no.  Will continue to monitor patient. Lupita Dawn

## 2014-08-24 NOTE — Progress Notes (Signed)
[facility] PROGRESS NOTE  Brandy Short GMW:[id number] DOB: 1941/09/12 DOA: 08/19/2014 PCP: Alesia Richards, MD  Summary:   73 year old white female presented after a fall resulting in right proximal humerus fracture.had a scalene block and subsequently had acute respiratory failure requiring intubation.  surgery was canceled and orthopedics performed a closed reduction of the fracture. Patient had diffuse ST depression and positive troponins. Underwent urgent cardiac catheterization which showed no significant obstructive coronary disease, and normal ejection fraction. Patient was subsequently extubated and transferred to hospitalists on 08/22/2014.  Assessment/Plan:   Acute respiratory failure:  Per nursing staff, desaturates into the 80s when off oxygen. ABG ok.  chest x-ray shows possible pneumonia. Will cover for hospital acquired/aspiration pneumonia   HCAP v. Aspiration pneumonia:  Yesterday's chest x-ray shows subtle upper infiltrates.  Will start vancomycin and Zosyn. May be contributing to altered level of consciousness- much more awake  Encephalopathy. Had recent TSH, B12, folate all ok.  Has not received sedation. Ammonia level normal. Urinalysis shows no infection. Limit sedating medications as able.   Patient's sister reports that even at baseline she falls asleep frequently during the day and slept through the entire wedding recently. 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 Will need outpatient sleep study when medically stable. - will get head CT  Humerus fracture:  to remain in splint at all times. Nonoperative management due to medical issues.  Will need skilled nursing facility at discharge. Lives alone.    Troponin level elevated: Type 2 MI  HTN: medications resumed.  DM: better on Levemir and increased NovoLog sliding scale.  Anemia: monitor. Recent anemia panel WNL.  Code Status:  full Family Communication:  Sister at bedside Disposition Plan:   SNF  Consultants:  Ortho  PCCM  cardiology  Procedures:   Cardiac cath  Scalene block  ETT  Left IJ CVL   HPI/Subjective: No overnight events Per family patient much more awake  Objective: Filed Vitals:   08/24/14 0740  BP:   Pulse: 100  Temp:   Resp:     Intake/Output Summary (Last 24 hours) at 08/24/14 1301 Last data filed at 08/24/14 0800  Gross per 24 hour  Intake    180 ml  Output    550 ml  Net   -370 ml   Filed Weights   08/21/14 0354 08/21/14 1607 08/22/14 0442  Weight: 54.749 kg (120 lb 11.2 oz) 54.749 kg (120 lb 11.2 oz) 55.7 kg (122 lb 12.7 oz)    Exam:   General: more alert today, but drifts off to sleep  Cardiovascular: RRR without MGR  Respiratory: diminished without WRR  Abdomen: S, NT, ND  Ext: right arm in sling. No CCE  Basic Metabolic Panel:  Recent Labs Lab 08/19/14 0822 08/19/14 1117 08/20/14 0405 08/21/14 0412 08/22/14 0555 08/23/14 0418 08/24/14 1005  NA 142 141 143 139 140 143 143  K 4.3 4.1 3.7 3.7 4.8 5.0 4.1  CL 104 105 110 101 105 107 104  CO2 25 22 17* 19 21 24 25   GLUCOSE 143* 215* 144* 181* 166* 161* 191*  BUN 25* 24* 26* 32* 37* 42* 46*  CREATININE 1.04 0.96 1.11* 1.24* 1.27* 1.35* 1.45*  CALCIUM 9.1 7.9* 7.7* 9.4 9.8 10.0 9.0  MG  --  1.4* 1.2*  --  1.8  --   --   PHOS  --  4.1 3.5  --  4.1  --   --    Liver Function Tests:  Recent Labs Lab 08/19/14 1117  AST 41*  ALT 25  ALKPHOS 76  BILITOT 0.7  PROT 5.2*  ALBUMIN 2.5*   No results found for this basename: LIPASE, AMYLASE,  in the last 168 hours  Recent Labs Lab 08/23/14 0418  AMMONIA 19   CBC:  Recent Labs Lab 08/20/14 1500 08/21/14 0412 08/22/14 0555 08/23/14 0418 08/24/14 1005  WBC 12.8* 17.0* 13.5* 11.1* 8.1  NEUTROABS  --  15.6*  --  10.1*  --   HGB 7.9* 8.2* 7.9* 8.3* 8.3*  HCT 23.6* 25.1* 24.1* 25.8* 25.4*  MCV 97.9 98.0 99.6 102.0* 101.6*  PLT 314 340 303 334 328   Cardiac Enzymes:  Recent Labs Lab  08/19/14 1113 08/19/14 1840 08/19/14 2245 08/20/14 0415  TROPONINI <0.30 0.77* 0.98* 1.13*   BNP (last 3 results)  Recent Labs  01/20/14 1353 08/23/14 0418  PROBNP 5546.0* 15994.0*   CBG:  Recent Labs Lab 08/23/14 1132 08/23/14 1633 08/23/14 2111 08/24/14 0631 08/24/14 1125  GLUCAP 150* 135* 125* 117* 191*    Recent Results (from the past 240 hour(s))  MRSA PCR SCREENING     Status: None   Collection Time    08/19/14 12:57 PM      Result Value Ref Range Status   MRSA by PCR NEGATIVE  NEGATIVE Final   Comment:            The GeneXpert MRSA Assay (FDA     approved for NASAL specimens     only), is one component of a     comprehensive MRSA colonization     surveillance program. It is not     intended to diagnose MRSA     infection nor to guide or     monitor treatment for     MRSA infections.     Studies: Dg Chest Port 1 View  08/22/2014   CLINICAL DATA:  Cough and confusion.  EXAM: PORTABLE CHEST - 1 VIEW  COMPARISON:  Chest x-ray 08/20/2014.  As CT chest 01/20/2014.  FINDINGS: Interim removal of endotracheal tube, NG tube, and IJ line. Mediastinum and hilar structures are normal. Mild bilateral upper lobe infiltrates noted consistent with pneumonia. Small left base infiltrate cannot be excluded. Small left pleural effusion. No pneumothorax. Mild cardiomegaly with normal pulmonary vascularity. No acute bony abnormality.  IMPRESSION: 1. Interim removal of endotracheal tube, NG tube, and IJ line. 2. Mild bilateral upper lobe and probable left lower lobe infiltrates. 3. Small left pleural effusion.   Electronically Signed   By: Smiley   On: 08/22/2014 18:52    Scheduled Meds: . antiseptic oral rinse  7 mL Mouth Rinse QID  . bisoprolol  10 mg Oral Daily  . brimonidine  1 drop Right Eye BID  . chlorhexidine  15 mL Mouth Rinse BID  . Difluprednate  1 drop Ophthalmic BID  . dorzolamide-timolol  1 drop Left Eye BID  . feeding supplement (GLUCERNA SHAKE)  237  mL Oral BID BM  . heparin  5,000 Units Subcutaneous 3 times per day  . insulin aspart  0-15 Units Subcutaneous TID WC  . insulin aspart  0-5 Units Subcutaneous QHS  . insulin detemir  8 Units Subcutaneous Daily  . latanoprost  1 drop Both Eyes QHS  . losartan  100 mg Oral Daily  . piperacillin-tazobactam (ZOSYN)  IV  3.375 g Intravenous 3 times per day  . vancomycin  750 mg Intravenous Q24H   Continuous Infusions: . sodium chloride 10 mL/hr at 08/20/14 1614  Time spent: 25 minutes  [name]  [facility] Pager [phone]. If 7PM-7AM, please contact night-coverage at [email or web address], password [facility] 08/24/2014, 1:01 PM  LOS: 5 days

## 2014-08-24 NOTE — Clinical Social Work Placement (Addendum)
Clinical Social Work Department CLINICAL SOCIAL WORK PLACEMENT NOTE 08/24/2014  Patient:  EDNA, REDE  Account Number:  [id number] Admit date:  08/19/2014  Clinical Social Worker:  Domenica Reamer, CLINICAL SOCIAL WORKER  Date/time:  08/23/2014 02:05 PM  Clinical Social Work is seeking post-discharge placement for this patient at the following level of care:   SKILLED NURSING   (*CSW will update this form in Epic as items are completed)   08/23/2014  Patient/family provided with [place] Department of Clinical Social Work's list of facilities offering this level of care within the geographic area requested by the patient (or if unable, by the patient's family).  08/23/2014  Patient/family informed of their freedom to choose among providers that offer the needed level of care, that participate in Medicare, Medicaid or managed care program needed by the patient, have an available bed and are willing to accept the patient.  08/23/2014  Patient/family informed of [facility]' ownership interest in [facility], as well as of the fact that they are under no obligation to receive care at this facility.  PASARR submitted to EDS on 08/23/2014 PASARR number received on 08/23/2014  FL2 transmitted to all facilities in geographic area requested by pt/family on  08/23/2014 FL2 transmitted to all facilities within larger geographic area on   Patient informed that his/her managed care company has contracts with or will negotiate with  certain facilities, including the following:     Patient/family informed of bed offers received:  08/24/2014 [facility] at [facility] recommends and patient chooses bed at    Patient to be transferred to [facility] on  08/26/2014 Patient to be transferred to facility by  [facility] Patient and family notified of transfer on 08/26/2014 Name of family member notified:  Patient and patient niece Shirlean Mylar) to notify additional family  members  The following physician request were entered in Epic:   Additional Comments:   Domenica Reamer, [place] Worker [phone]

## 2014-08-25 LAB — GLUCOSE, CAPILLARY
Glucose-Capillary: 123 mg/dL — ABNORMAL HIGH (ref 70–99)
Glucose-Capillary: 142 mg/dL — ABNORMAL HIGH (ref 70–99)
Glucose-Capillary: 122 mg/dL — ABNORMAL HIGH (ref 70–99)
Glucose-Capillary: 75 mg/dL (ref 70–99)

## 2014-08-25 MED ORDER — LEVOFLOXACIN IN D5W 750 MG/150ML IV SOLN
750.0000 mg | INTRAVENOUS | Status: DC
  Administered 2014-08-25: 750 mg via INTRAVENOUS
  Filled 2014-08-25: qty 150

## 2014-08-25 MED ORDER — HYDRALAZINE HCL 25 MG PO TABS
25.0000 mg | ORAL_TABLET | Freq: Four times a day (QID) | ORAL | Status: DC
  Administered 2014-08-25 – 2014-08-26 (×4): 25 mg via ORAL
  Filled 2014-08-25 (×8): qty 1

## 2014-08-25 MED ORDER — ALUM & MAG HYDROXIDE-SIMETH 200-200-20 MG/5ML PO SUSP: 15.0000 mL | Freq: Four times a day (QID) | ORAL | Status: DC | PRN

## 2014-08-25 NOTE — Progress Notes (Signed)
Physical Therapy Treatment Patient Details Name: ALONNAH LAMPKINS MRN: [id number] DOB: July 28, 1941 Today's Date: 08/25/2014    History of Present Illness Pt is a 73 y.o. female with a history of DM and hypertension, who is admitted to the hospital with fracture of her right humerus after fall at home (x5 falls in past 2 months per RN). Apparently, prior to undergoing ORIF surgery she had a respiratory arrest in the PACU and was intubated. ECG showed diffuse ST segment depression. She was then referred for an urgent cardiac catheterization on 08/19/14. Pt was extubated on 08/20/14.    PT Comments    Therapy session focused on increasing pt mobility and repositioning pt. Pt very uncomfortable in sling and in bed. Rt UE sling repositioned throughout session and family present for education. Cont to recommend SNF.   Follow Up Recommendations  Supervision/Assistance - 24 hour;SNF     Equipment Recommendations  Rolling walker with 5" wheels    Recommendations for Other Services Rehab consult     Precautions / Restrictions Precautions Precautions: Fall Precaution Comments: Per RN x5 falls in past 2 months. sling on at all times; assume NWB status  Required Braces or Orthoses: Sling;Other Brace/Splint Restrictions Weight Bearing Restrictions: Yes RUE Weight Bearing: Non weight bearing    Mobility  Bed Mobility Overal bed mobility: Needs Assistance;+ 2 for safety/equipment Bed Mobility: Rolling;Sit to Sidelying Rolling: +2 for physical assistance;Min assist       Sit to sidelying: +2 for safety/equipment;Min assist General bed mobility comments: pt very anxious with bed mobility; requries (A) to return to sidelying on lt and roll to supine position; 1 person for (A) with mobility, while second helps support and prevent WB through Rt UE; max cues   Transfers Overall transfer level: Needs assistance Equipment used: 1 person hand held assist (support through gt belt) Transfers: Sit to/from  Stand Sit to Stand: +2 physical assistance;Mod assist         General transfer comment: cues to maintain NWB through Rt UE; 2 person (A) for safety with support through Lt UE and through gt belt  Ambulation/Gait Ambulation/Gait assistance: +2 physical assistance;Mod assist Ambulation Distance (Feet): 8 Feet Assistive device: 2 person hand held assist Gait Pattern/deviations: Step-through pattern;Ataxic;Shuffle;Decreased stride length;Narrow base of support Gait velocity: Decreased Gait velocity interpretation: Below normal speed for age/gender General Gait Details: 2 person (A); pt very unsteady with ambulation and required max VC's for safety and tactile cues for upright posture    Stairs            Wheelchair Mobility    Modified Rankin (Stroke Patients Only)       Balance Overall balance assessment: Needs assistance;History of Falls Sitting-balance support: Feet supported;No upper extremity supported Sitting balance-Leahy Scale: Fair     Standing balance support: During functional activity;Single extremity supported Standing balance-Leahy Scale: Poor Standing balance comment: 2 person (A)                     Cognition Arousal/Alertness: Lethargic;Suspect due to medications Behavior During Therapy: Flat affect Overall Cognitive Status: Impaired/Different from baseline Area of Impairment: Orientation;Attention;Following commands;Awareness Orientation Level: Disoriented to;Time;Place Current Attention Level: Focused Memory: Decreased short-term memory;Decreased recall of precautions Following Commands: Follows one step commands inconsistently   Awareness: Intellectual   General Comments: pt falling asleep while PT talking to pt; continues minimal responses     Exercises General Exercises - Lower Extremity Ankle Circles/Pumps: AAROM;Both;Seated;10 reps Long Arc Quad: AROM;Strengthening;Both;10 reps;Seated  General Comments General comments (skin  integrity, edema, etc.): repositioned pt multiple times in bed; also readjusted Rt UE sling throughout session for proper wear and comfort       Pertinent Vitals/Pain Pain Assessment: Faces Faces Pain Scale: Hurts whole lot Pain Location: Rt shoulder with activity  Pain Descriptors / Indicators:  (did not state) Pain Intervention(s): Monitored during session;Limited activity within patient's tolerance;Repositioned    Home Living                      Prior Function            PT Goals (current goals can now be found in the care plan section) Acute Rehab PT Goals Patient Stated Goal: none stated PT Goal Formulation: With patient Time For Goal Achievement: 09/03/14 Potential to Achieve Goals: Fair Progress towards PT goals: Progressing toward goals    Frequency  Min 3X/week    PT Plan Current plan remains appropriate    Co-evaluation             End of Session Equipment Utilized During Treatment: Gait belt Activity Tolerance: Patient limited by fatigue;Patient limited by pain Patient left: in bed;with call bell/phone within reach;with bed alarm set;with family/visitor present     Time: 7035-0093 PT Time Calculation (min): 24 min  Charges:  $Gait Training: 8-22 mins $Therapeutic Activity: 8-22 mins                    G CodesGustavus Bryant, [place] 08/25/2014, 5:01 PM

## 2014-08-25 NOTE — Progress Notes (Addendum)
[facility] PROGRESS NOTE  Brandy Short FFM:[id number] DOB: June 16, 1941 DOA: 08/19/2014 PCP: Alesia Richards, MD  Summary:   73 year old white female presented after a fall resulting in right proximal humerus fracture.had a scalene block and subsequently had acute respiratory failure requiring intubation.  surgery was canceled and orthopedics performed a closed reduction of the fracture. Patient had diffuse ST depression and positive troponins. Underwent urgent cardiac catheterization which showed no significant obstructive coronary disease, and normal ejection fraction. Patient was subsequently extubated and transferred to hospitalists on 08/22/2014.  Assessment/Plan:   Acute respiratory failure:   ABG ok.  chest x-ray shows possible pneumonia. Will cover for hospital acquired/aspiration pneumonia -change to PO levaquin  HCAP v. Aspiration pneumonia:  Yesterday's chest x-ray shows subtle upper infiltrates.   -vancomycin and Zosyn- change to levaquin today  Encephalopathy. Had recent TSH, B12, folate all ok.  Has not received sedation. Ammonia level normal. Urinalysis shows no infection. Limit sedating medications as able.   Patient's sister reports that even at baseline she falls asleep frequently during the day and slept through the entire wedding recently. 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 Will need outpatient sleep study when medically stable. - head CT ok  Humerus fracture:  to remain in splint at all times. Nonoperative management due to medical issues.  Will need skilled nursing facility at discharge. Lives alone. - will need to see Dr. Tamera Punt in 2 week, NWB in extremity, can use hand, needs to wear sling    Troponin level elevated: Type 2 MI  HTN:  D/c arb due to AKI -added hydralazine  DM: better on Levemir and increased NovoLog sliding scale.  Anemia: monitor. Recent anemia panel WNL.  AKI -trend -encourage PO intake -d/c ARB   Code Status:   full Family Communication:  Sister at bedside 9/1 Disposition Plan:  SNF in AM  Consultants:  Ortho  PCCM  cardiology  Procedures:   Cardiac cath  Scalene block  ETT  Left IJ CVL   HPI/Subjective: Up in chair No complaints other than wanting to go home   Objective: Filed Vitals:   08/25/14 0509  BP: 151/62  Pulse: 73  Temp: 98.8 F (37.1 C)  Resp: 18    Intake/Output Summary (Last 24 hours) at 08/25/14 1021 Last data filed at 08/25/14 0917  Gross per 24 hour  Intake    388 ml  Output    800 ml  Net   -412 ml   Filed Weights   08/21/14 0354 08/21/14 1607 08/22/14 0442  Weight: 54.749 kg (120 lb 11.2 oz) 54.749 kg (120 lb 11.2 oz) 55.7 kg (122 lb 12.7 oz)    Exam:   General: alert today  Cardiovascular: RRR without MGR  Respiratory: diminished without WRR  Abdomen: S, NT, ND  Ext: right arm in sling. No CCE  Basic Metabolic Panel:  Recent Labs Lab 08/19/14 0822 08/19/14 1117 08/20/14 0405 08/21/14 0412 08/22/14 0555 08/23/14 0418 08/24/14 1005  NA 142 141 143 139 140 143 143  K 4.3 4.1 3.7 3.7 4.8 5.0 4.1  CL 104 105 110 101 105 107 104  CO2 25 22 17* 19 21 24 25   GLUCOSE 143* 215* 144* 181* 166* 161* 191*  BUN 25* 24* 26* 32* 37* 42* 46*  CREATININE 1.04 0.96 1.11* 1.24* 1.27* 1.35* 1.45*  CALCIUM 9.1 7.9* 7.7* 9.4 9.8 10.0 9.0  MG  --  1.4* 1.2*  --  1.8  --   --   PHOS  --  4.1  3.5  --  4.1  --   --    Liver Function Tests:  Recent Labs Lab 08/19/14 1117  AST 41*  ALT 25  ALKPHOS 76  BILITOT 0.7  PROT 5.2*  ALBUMIN 2.5*   No results found for this basename: LIPASE, AMYLASE,  in the last 168 hours  Recent Labs Lab 08/23/14 0418  AMMONIA 19   CBC:  Recent Labs Lab 08/20/14 1500 08/21/14 0412 08/22/14 0555 08/23/14 0418 08/24/14 1005  WBC 12.8* 17.0* 13.5* 11.1* 8.1  NEUTROABS  --  15.6*  --  10.1*  --   HGB 7.9* 8.2* 7.9* 8.3* 8.3*  HCT 23.6* 25.1* 24.1* 25.8* 25.4*  MCV 97.9 98.0 99.6 102.0* 101.6*   PLT 314 340 303 334 328   Cardiac Enzymes:  Recent Labs Lab 08/19/14 1113 08/19/14 1840 08/19/14 2245 08/20/14 0415  TROPONINI <0.30 0.77* 0.98* 1.13*   BNP (last 3 results)  Recent Labs  01/20/14 1353 08/23/14 0418  PROBNP 5546.0* 15994.0*   CBG:  Recent Labs Lab 08/24/14 0631 08/24/14 1125 08/24/14 1627 08/24/14 2046 08/25/14 0601  GLUCAP 117* 191* 137* 86 123*    Recent Results (from the past 240 hour(s))  MRSA PCR SCREENING     Status: None   Collection Time    08/19/14 12:57 PM      Result Value Ref Range Status   MRSA by PCR NEGATIVE  NEGATIVE Final   Comment:            The GeneXpert MRSA Assay (FDA     approved for NASAL specimens     only), is one component of a     comprehensive MRSA colonization     surveillance program. It is not     intended to diagnose MRSA     infection nor to guide or     monitor treatment for     MRSA infections.     Studies: Ct Head Wo Contrast  08/24/2014   CLINICAL DATA:  Altered mental status  EXAM: CT HEAD WITHOUT CONTRAST  TECHNIQUE: Contiguous axial images were obtained from the base of the skull through the vertex without intravenous contrast.  COMPARISON:  02/19/2013  FINDINGS: The bony calvarium is intact. Mild atrophic changes and mild chronic white matter ischemic change is seen. No findings to suggest acute hemorrhage, acute infarction or space-occupying mass lesion are noted.  IMPRESSION: Chronic changes without acute abnormality.   Electronically Signed   By: Inez Catalina M.D.   On: 08/24/2014 13:23    Scheduled Meds: . antiseptic oral rinse  7 mL Mouth Rinse QID  . bisoprolol  10 mg Oral Daily  . brimonidine  1 drop Right Eye BID  . chlorhexidine  15 mL Mouth Rinse BID  . Difluprednate  1 drop Ophthalmic BID  . dorzolamide-timolol  1 drop Left Eye BID  . feeding supplement (GLUCERNA SHAKE)  237 mL Oral BID BM  . heparin  5,000 Units Subcutaneous 3 times per day  . hydrALAZINE  25 mg Oral 4 times per day   . insulin aspart  0-15 Units Subcutaneous TID WC  . insulin aspart  0-5 Units Subcutaneous QHS  . insulin detemir  8 Units Subcutaneous Daily  . latanoprost  1 drop Both Eyes QHS   Continuous Infusions: . sodium chloride 10 mL/hr at 08/20/14 1614    Time spent: 25 minutes  Brandy Short  [facility] Pager [phone]. If 7PM-7AM, please contact night-coverage at [email or web address], password [facility] 08/25/2014, 10:21 AM  LOS: 6 days   

## 2014-08-25 NOTE — Progress Notes (Signed)
ANTIBIOTIC CONSULT NOTE - INITIAL  Pharmacy Consult for Levaquin Indication: pneumonia  Allergies  Allergen Reactions  . Actonel [Risedronate Sodium]     Dyspepsia  . Codeine Nausea Only and Other (See Comments)    Nightmares  . Hyosol-Sl [Hyoscyamine] Other (See Comments)    Dry mouth  . Metformin And Related Diarrhea  . Phenobarbital Diarrhea  . Ace Inhibitors Rash  . Dilaudid [Hydromorphone Hcl] Rash  . Quinine Derivatives Rash  . Sulfur Rash  . Tetracyclines & Related Rash    Skin peeled off     Patient Measurements: Height: 5\' 5"  (165.1 cm) Weight: 122 lb 12.7 oz (55.7 kg) IBW/kg (Calculated) : 57 Adjusted Body Weight:    Vital Signs: Temp: 98.8 F (37.1 C) (09/02 0509) Temp src: Oral (09/02 0509) BP: 151/62 mmHg (09/02 0509) Pulse Rate: 68 (09/02 0821) Intake/Output from previous day: 09/01 0701 - 09/02 0700 In: 448 [P.O.:398; IV Piggyback:50] Out: 600 [Urine:600] Intake/Output from this shift: Total I/O In: 0  Out: 200 [Urine:200]  Labs:  Recent Labs  08/23/14 0418 08/24/14 1005  WBC 11.1* 8.1  HGB 8.3* 8.3*  PLT 334 328  CREATININE 1.35* 1.45*   Estimated Creatinine Clearance: 30.4 ml/min (by C-G formula based on Cr of 1.45). No results found for this basename: VANCOTROUGH, VANCOPEAK, VANCORANDOM, GENTTROUGH, GENTPEAK, GENTRANDOM, TOBRATROUGH, TOBRAPEAK, TOBRARND, AMIKACINPEAK, AMIKACINTROU, AMIKACIN,  in the last 72 hours   Microbiology:   Medical History: Past Medical History  Diagnosis Date  . Depression   . Diverticulosis   . Adenomatous colon polyp   . Asthma   . Hypertension   . Mixed hyperlipidemia   . Type II or unspecified type diabetes mellitus without mention of complication, not stated as uncontrolled   . Anemia   . GERD (gastroesophageal reflux disease)   . Vitamin D deficiency   . Glaucoma   . Diverticulitis   . Heart murmur   . Pneumonia   . Cancer     basal cell carcinoma of the skin   Assessment:  73 yo female  admitted on 8/27 s/p fall with R proximal humerus fracture. She went to OR and arrested after nerve block was given.   Anticoag: SQ heparin. Hgb low 8.3. Plts stable  ID: PNA (HCAP vs aspiration). Afebrile. WBC 11.1>>8.1 down. Infiltrates on CXR. Change to Levaquin Vanco 8/31>>9/2 Zosyn 8/31>>9/2 Levaquin 9/2>>  CV: ST depression w/ + Tp. s/p cath w/ Moderate coronary calcification without significant CAD. EF= 55-60% on Echo. BP 151/62, HR 68 Meds: bisoprolol, hydralazine (losartan d/c'd for rising Scr)  Endo: glucose 86-191 on SSI, Levemir.  Renal: SCr 1.45 trending up. K=4.1 improved  Neuro: encephalopathy  Heme: Hgb 8.3 stable  Pulm: s/p respiratory arrest in the OR. Extubated on RA on 8/28  PTA meds: metformin, hctz  BP: SQ heparin, MC   Plan:  D/c Vanco and Zosyn Levaquin 750mg  IV q48h   Chaunte Hornbeck S. Alford Highland, PharmD, [facility] Clinical [facility] Pharmacist Pager [phone]  Eilene Ghazi Stillinger 08/25/2014,10:30 AM

## 2014-08-25 NOTE — Clinical Social Work Note (Signed)
CSW was requested by patients family.  Patients family wanted to follow up with a doctor concerning the patients surgery for her fractured arm which had to be stopped because of the patients reaction to anesthesia.  CSW paged the doctor to discuss follow up plan with the patient and patient's family. CSW will continue to follow.  Domenica Reamer, [facility] Social Worker [phone]

## 2014-08-25 NOTE — Progress Notes (Signed)
08/25/2014 5:07 AM Progress Notes Patient says she has been miserable the whole night with little relief. She would like to speak to the charge nurse this morning about transferring to another facility where her pain will be put under control.  Brandy Short

## 2014-08-25 NOTE — Progress Notes (Signed)
Occupational Therapy Treatment Patient Details Name: Brandy Short MRN: [id number] DOB: 07-14-1941 Today's Date: 08/25/2014    History of present illness Pt is a 73 y.o. female with a history of DM and hypertension, who is admitted to the hospital with fracture of her right humerus after fall at home (x5 falls in past 2 months per RN). Apparently, prior to undergoing ORIF surgery she had a respiratory arrest in the PACU and was intubated. ECG showed diffuse ST segment depression. She was then referred for an urgent cardiac catheterization on 08/19/14. Pt was extubated on 08/20/14.   OT comments  Pt progressing with functional mobility and ADLs.  She demonstrates cognitive deficits that impede her safety.  Follow Up Recommendations  SNF;Supervision/Assistance - 24 hour    Equipment Recommendations  None recommended by OT    Recommendations for Other Services      Precautions / Restrictions Precautions Precautions: Fall Precaution Comments: Per RN x5 falls in past 2 months. sling on at all times; assume NWB status  Required Braces or Orthoses: Sling;Other Brace/Splint Restrictions Weight Bearing Restrictions: Yes RUE Weight Bearing: Non weight bearing       Mobility Bed Mobility Overal bed mobility: Needs Assistance Bed Mobility: Supine to Sit Rolling: +2 for physical assistance;Min assist     Sit to supine: Min assist Sit to sidelying: +2 for safety/equipment;Min assist General bed mobility comments: assist to scoot hips to EOB  Transfers Overall transfer level: Needs assistance Equipment used: 1 person hand held assist Transfers: Sit to/from Stand;Stand Pivot Transfers Sit to Stand: Min assist Stand pivot transfers: Min assist       General transfer comment: Pt ambualted to door and back with min A    Balance Overall balance assessment: Needs assistance Sitting-balance support: Feet supported Sitting balance-Leahy Scale: Good     Standing balance support: Single  extremity supported Standing balance-Leahy Scale: Poor Standing balance comment: 2 person (A)                    ADL                       Lower Body Dressing: Moderate assistance;Sit to/from stand Lower Body Dressing Details (indicate cue type and reason): Pt donned/doffedd socks with min A.  Pt requires cues to avoid leaning on Rt. UE.   Toilet Transfer: Minimal assistance;Ambulation;Comfort height toilet   Toileting- Clothing Manipulation and Hygiene: Maximal assistance;Sit to/from stand       Functional mobility during ADLs: Minimal assistance (HHA) General ADL Comments: Pt moves very slowly.  Requires verbal cues for safety.  She has a lot of questions re; what caused her cardiac arrest, how long she will be immobilized; why the MD's haven't spoke to her about any of this (anticipate they have, she likely is not remembering). Pt was instructed in precautions, discussed one handed techniques, answered patient questions       Vision                     Perception     Praxis      Cognition   Behavior During Therapy: Flat affect Overall Cognitive Status: Impaired/Different from baseline Area of Impairment: Orientation;Attention;Memory;Following commands;Problem solving;Awareness;Safety/judgement Orientation Level: Disoriented to;Time Current Attention Level: Sustained Memory: Decreased short-term memory;Decreased recall of precautions  Following Commands: Follows one step commands consistently;Follows one step commands with increased time Safety/Judgement: Decreased awareness of safety;Decreased awareness of deficits Awareness: Intellectual Problem Solving: Slow processing;Decreased  initiation;Difficulty sequencing;Requires verbal cues;Requires tactile cues General Comments: Pt with flat affect.  Requires increased time for processing.  She frequently asked which UE she should use when asked to perform an activity.  Pt with poor problem solving      Extremity/Trunk Assessment               Exercises General Exercises - Lower Extremity Ankle Circles/Pumps: AAROM;Both;Seated;10 reps Long Arc Quad: AROM;Strengthening;Both;10 reps;Seated   Shoulder Instructions       General Comments      Pertinent Vitals/ Pain       Pain Assessment: No/denies pain Faces Pain Scale: Hurts whole lot Pain Location: Rt shoulder with activity  Pain Descriptors / Indicators:  (did not state) Pain Intervention(s): Monitored during session;Limited activity within patient's tolerance;Repositioned  Home Living                                          Prior Functioning/Environment              Frequency Min 2X/week     Progress Toward Goals  OT Goals(current goals can now be found in the care plan section)  Progress towards OT goals: Progressing toward goals  Acute Rehab OT Goals Patient Stated Goal: none stated ADL Goals Pt Will Perform Eating: with mod assist;sitting Pt Will Perform Grooming: with mod assist;sitting Pt Will Perform Upper Body Bathing: with max assist;with mod assist;sitting Pt Will Perform Upper Body Dressing: with max assist;with mod assist;sitting Pt Will Transfer to Toilet: with min assist;bedside commode Additional ADL Goal #1: Pt/family education for proper sling wear and positioning for R UE  Plan Discharge plan remains appropriate    Co-evaluation                 End of Session Equipment Utilized During Treatment: Other (comment) (sling)   Activity Tolerance Patient tolerated treatment well   Patient Left in chair;with call bell/phone within reach;with family/visitor present   Nurse Communication Mobility status        Time: 1530-1611 OT Time Calculation (min): 41 min  Charges: OT General Charges $OT Visit: 1 Procedure OT Treatments $Self Care/Home Management : 23-37 mins $Therapeutic Activity: 8-22 mins  Brandy Short M 08/25/2014, 5:16 PM

## 2014-08-26 LAB — BASIC METABOLIC PANEL
Anion gap: 14 (ref 5–15)
BUN: 36 mg/dL — AB (ref 6–23)
CO2: 24 mEq/L (ref 19–32)
CREATININE: 1.4 mg/dL — AB (ref 0.50–1.10)
Calcium: 8.8 mg/dL (ref 8.4–10.5)
Chloride: 102 mEq/L (ref 96–112)
GFR calc Af Amer: 42 mL/min — ABNORMAL LOW (ref >90)
GFR, EST NON AFRICAN AMERICAN: 36 mL/min — AB (ref >90)
GLUCOSE: 182 mg/dL — AB (ref 70–99)
POTASSIUM: 3.8 meq/L (ref 3.7–5.3)
Sodium: 140 mEq/L (ref 137–147)

## 2014-08-26 LAB — GLUCOSE, CAPILLARY
GLUCOSE-CAPILLARY: 182 mg/dL — AB (ref 70–99)
Glucose-Capillary: 175 mg/dL — ABNORMAL HIGH (ref 70–99)
Glucose-Capillary: 216 mg/dL — ABNORMAL HIGH (ref 70–99)

## 2014-08-26 LAB — CBC
HCT: 25.2 % — ABNORMAL LOW (ref 36.0–46.0)
HEMOGLOBIN: 8.1 g/dL — AB (ref 12.0–15.0)
MCH: 31.5 pg (ref 26.0–34.0)
MCHC: 32.1 g/dL (ref 30.0–36.0)
MCV: 98.1 fL (ref 78.0–100.0)
Platelets: 388 10*3/uL (ref 150–400)
RBC: 2.57 MIL/uL — ABNORMAL LOW (ref 3.87–5.11)
RDW: 13.8 % (ref 11.5–15.5)
WBC: 8 10*3/uL (ref 4.0–10.5)

## 2014-08-26 MED ORDER — HYDRALAZINE HCL 50 MG PO TABS: 50.0000 mg | ORAL_TABLET | Freq: Four times a day (QID) | ORAL | Status: DC

## 2014-08-26 MED ORDER — INSULIN ASPART 100 UNIT/ML ~~LOC~~ SOLN: 0.0000 [IU] | Freq: Every day | SUBCUTANEOUS | Status: DC

## 2014-08-26 MED ORDER — ALUM & MAG HYDROXIDE-SIMETH 200-200-20 MG/5ML PO SUSP: 15.0000 mL | Freq: Four times a day (QID) | ORAL | Status: DC | PRN

## 2014-08-26 MED ORDER — GLUCERNA SHAKE PO LIQD: 237.0000 mL | Freq: Two times a day (BID) | ORAL | Status: DC

## 2014-08-26 MED ORDER — ALBUTEROL SULFATE (2.5 MG/3ML) 0.083% IN NEBU: 2.5000 mg | INHALATION_SOLUTION | RESPIRATORY_TRACT | Status: AC | PRN

## 2014-08-26 MED ORDER — HYDRALAZINE HCL 50 MG PO TABS
50.0000 mg | ORAL_TABLET | Freq: Four times a day (QID) | ORAL | Status: DC
  Administered 2014-08-26: 50 mg via ORAL
  Filled 2014-08-26 (×4): qty 1

## 2014-08-26 MED ORDER — LEVOFLOXACIN 750 MG PO TABS: 750.0000 mg | ORAL_TABLET | Freq: Every day | ORAL | Status: DC

## 2014-08-26 MED ORDER — ONDANSETRON HCL 4 MG PO TABS: 4.0000 mg | ORAL_TABLET | Freq: Three times a day (TID) | ORAL | Status: DC | PRN

## 2014-08-26 MED ORDER — INSULIN DETEMIR 100 UNIT/ML ~~LOC~~ SOLN: 8.0000 [IU] | Freq: Every day | SUBCUTANEOUS | Status: DC

## 2014-08-26 MED ORDER — LEVOFLOXACIN 750 MG PO TABS: 750.0000 mg | ORAL_TABLET | ORAL | Status: DC

## 2014-08-26 MED ORDER — INSULIN ASPART 100 UNIT/ML ~~LOC~~ SOLN: 0.0000 [IU] | Freq: Three times a day (TID) | SUBCUTANEOUS | Status: DC

## 2014-08-26 MED ORDER — HYDROCODONE-ACETAMINOPHEN 5-325 MG PO TABS: 0.5000 | ORAL_TABLET | ORAL | Status: DC | PRN

## 2014-08-26 NOTE — Discharge Summary (Signed)
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   Coronary artery disease (CAD) excluded: No obstructive CAD on Cardiac CAth.   Acute encephalopathy   Discharge Condition: improved  Diet recommendation: cardiac/diabetic  Filed Weights   08/21/14 0354 08/21/14 1607 08/22/14 0442  Weight: 54.749 kg (120 lb 11.2 oz) 54.749 kg (120 lb 11.2 oz) 55.7 kg (122 lb 12.7 oz)    History of present illness:  73 yo female with hx HTN, DM, presented 8/27 for scheduled ORIF R humerus in setting displaced fx. IS block done and 15 mins later pt became very SOB, HTN and c/o chest tightness and ultimately required intubation. Did have closed reduction at bedside but not have ORIF. PCCM called to admit   Hospital Course:  Acute respiratory failure:  ABG ok. chest x-ray shows possible pneumonia. Will cover for hospital acquired/aspiration pneumonia  -change to PO levaquin   HCAP v. Aspiration pneumonia: Yesterday's chest x-ray shows subtle upper infiltrates.  -vancomycin and Zosyn- change to levaquin to complete course   Encephalopathy. Had recent TSH, B12, folate all ok. Has not received sedation. Ammonia level normal. Urinalysis shows no infection. Limit sedating medications as able.  Patient's sister reports that even at baseline she falls asleep frequently during the day and slept through the entire wedding  recently. 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 Will need outpatient sleep study when medically stable.  - head CT ok  RESOLVED  Humerus fracture: to remain in splint at all times. Nonoperative management due to medical issues.  - will need to see Dr. Tamera Punt in 2 week, NWB in extremity, can use hand, needs to wear sling   Troponin level elevated: Type 2 MI   HTN:  D/c arb due to AKI  -added hydralazine Titrate as outpatient   DM: better on Levemir and increased NovoLog sliding scale.   Anemia: monitor. Recent anemia panel WNL.   AKI  -stable -encourage PO intake  -d/c ARB  -trend as outpatient     Procedures:  Closed reduction  Consultations:  Ortho  PCCM  Discharge Exam: Filed Vitals:   08/26/14 0506  BP: 168/51  Pulse: 83  Temp: 98.9 F (37.2 C)  Resp: 18    General: pleasant Cardiovascular: rrr Respiratory: clears with cough  Discharge Instructions You were cared for by a hospitalist during your hospital stay. If you have any questions about your discharge medications or the care you received while you were in the hospital after you are discharged, you can call the unit and asked to speak with the hospitalist on call if the hospitalist that took care of you is not available. Once you are discharged, your primary care physician will handle any further medical issues. Please note that NO REFILLS for any discharge medications will be authorized once you are discharged, as it is imperative that you return to your primary care physician (or establish  a relationship with a primary care physician if you do not have one) for your aftercare needs so that they can reassess your need for medications and monitor your lab values.  Discharge Instructions   Diet - low sodium heart healthy    Complete by:  As directed      Diet Carb Modified    Complete by:  As directed      Discharge instructions    Complete by:  As directed   BMp 1 week NWB to RUE Wear sling May  use hands Needs to see Dr Tamera Punt in 2 weeks for x ray of arm     Increase activity slowly    Complete by:  As directed           Current Discharge Medication List    START taking these medications   Details  albuterol (PROVENTIL) (2.5 MG/3ML) 0.083% nebulizer solution Take 3 mLs (2.5 mg total) by nebulization every 2 (two) hours as needed for wheezing or shortness of breath. Qty: 75 mL, Refills: 12    alum & mag hydroxide-simeth (MAALOX/MYLANTA) 200-200-20 MG/5ML suspension Take 15 mLs by mouth every 6 (six) hours as needed for indigestion or heartburn. Qty: 355 mL, Refills: 0    feeding supplement, GLUCERNA SHAKE, (GLUCERNA SHAKE) LIQD Take 237 mLs by mouth 2 (two) times daily between meals. Refills: 0    hydrALAZINE (APRESOLINE) 50 MG tablet Take 1 tablet (50 mg total) by mouth every 6 (six) hours.    !! insulin aspart (NOVOLOG) 100 UNIT/ML injection Inject 0-15 Units into the skin 3 (three) times daily with meals. Qty: 10 mL, Refills: 11    !! insulin aspart (NOVOLOG) 100 UNIT/ML injection Inject 0-5 Units into the skin at bedtime. Qty: 10 mL, Refills: 11    levofloxacin (LEVAQUIN) 750 MG tablet Take 1 tablet (750 mg total) by mouth every other day.    ondansetron (ZOFRAN) 4 MG tablet Take 1 tablet (4 mg total) by mouth every 8 (eight) hours as needed for nausea or vomiting. Qty: 20 tablet, Refills: 0     !! - Potential duplicate medications found. Please discuss with provider.    CONTINUE these medications which have CHANGED   Details  HYDROcodone-acetaminophen (NORCO/VICODIN) 5-325 MG per tablet Take 0.5-1 tablets by mouth every 4 (four) hours as needed for moderate pain. Qty: 30 tablet, Refills: 0    insulin detemir (LEVEMIR) 100 UNIT/ML injection Inject 0.08 mLs (8 Units total) into the skin daily. Qty: 10 mL, Refills: 11      CONTINUE these medications which have NOT CHANGED   Details  acidophilus (RISAQUAD) CAPS Take 1 capsule by mouth daily.     albuterol  (PROVENTIL HFA;VENTOLIN HFA) 108 (90 BASE) MCG/ACT inhaler Inhale 1 puff into the lungs 2 (two) times daily as needed for wheezing or shortness of breath.    bisoprolol (ZEBETA) 10 MG tablet 5-10 mg at bedtime. Take 1/2 to 1 tablet daily as directed for BP    brimonidine (ALPHAGAN) 0.15 % ophthalmic solution Place 1 drop into the right eye 2 (two) times daily at 10 AM and 5 PM.     Calcium Carbonate (CALCIUM 600 PO) Take 1 tablet by mouth daily.    clobetasol (TEMOVATE) 0.05 % external solution Apply 1 application topically 2 (two) times daily.     Cyanocobalamin (VITAMIN B 12 PO) Take 1,000 mcg by mouth daily.    Difluprednate (DUREZOL) 0.05 % EMUL Apply 1 drop to eye 2 (two) times daily. 1  ggt to surgical eye BID    dorzolamide-timolol (COSOPT) 22.3-6.8 MG/ML ophthalmic solution Place 1 drop into the left eye 2 (two) times daily.     LUMIGAN 0.01 % SOLN Place 1 drop into both eyes at bedtime.       STOP taking these medications     betamethasone dipropionate (DIPROLENE) 0.05 % cream      hydrochlorothiazide (HYDRODIURIL) 25 MG tablet      losartan (COZAAR) 100 MG tablet      metFORMIN (GLUCOPHAGE-XR) 500 MG 24 hr tablet      ONE TOUCH ULTRA TEST test strip      Red Yeast Rice 600 MG CAPS        Allergies  Allergen Reactions  . Actonel [Risedronate Sodium]     Dyspepsia  . Codeine Nausea Only and Other (See Comments)    Nightmares  . Hyosol-Sl [Hyoscyamine] Other (See Comments)    Dry mouth  . Metformin And Related Diarrhea  . Phenobarbital Diarrhea  . Ace Inhibitors Rash  . Dilaudid [Hydromorphone Hcl] Rash  . Quinine Derivatives Rash  . Sulfur Rash  . Tetracyclines & Related Rash    Skin peeled off       The results of significant diagnostics from this hospitalization (including imaging, microbiology, ancillary and laboratory) are listed below for reference.    Significant Diagnostic Studies: Dg Shoulder Right  08/09/2014   CLINICAL DATA:  Patient tripped  and fell this afternoon. Landed on the shoulder. Pain and decreased range of motion.  EXAM: RIGHT SHOULDER - 2+ VIEW  COMPARISON:  None  FINDINGS: There is an oblique fracture of the right humeral neck. No dislocation. The scapula appears intact.  IMPRESSION: Fracture of the right humeral neck.   Electronically Signed   By: Shon Hale M.D.   On: 08/09/2014 19:14   Ct Head Wo Contrast  08/24/2014   CLINICAL DATA:  Altered mental status  EXAM: CT HEAD WITHOUT CONTRAST  TECHNIQUE: Contiguous axial images were obtained from the base of the skull through the vertex without intravenous contrast.  COMPARISON:  02/19/2013  FINDINGS: The bony calvarium is intact. Mild atrophic changes and mild chronic white matter ischemic change is seen. No findings to suggest acute hemorrhage, acute infarction or space-occupying mass lesion are noted.  IMPRESSION: Chronic changes without acute abnormality.   Electronically Signed   By: Inez Catalina M.D.   On: 08/24/2014 13:23   Dg Chest Port 1 View  08/22/2014   CLINICAL DATA:  Cough and confusion.  EXAM: PORTABLE CHEST - 1 VIEW  COMPARISON:  Chest x-ray 08/20/2014.  As CT chest 01/20/2014.  FINDINGS: Interim removal of endotracheal tube, NG tube, and IJ line. Mediastinum and hilar structures are normal. Mild bilateral upper lobe infiltrates noted consistent with pneumonia. Small left base infiltrate cannot be excluded. Small left pleural effusion. No pneumothorax. Mild cardiomegaly with normal pulmonary vascularity. No acute bony abnormality.  IMPRESSION: 1. Interim removal of endotracheal tube, NG tube, and IJ line. 2. Mild bilateral upper lobe and probable left lower lobe infiltrates. 3. Small left pleural effusion.   Electronically Signed   By: Marcello Moores  Register   On: 08/22/2014 18:52   Dg Chest Port 1 View  08/20/2014   CLINICAL DATA:  Endotracheal tube placement  EXAM: PORTABLE CHEST - 1 VIEW  COMPARISON:  In 07/12/2014  FINDINGS: Endotracheal tube tip is now 7 mm above the  carina. Internal jugular line has been retracted with tip now over the brachiocephalic vein on  the left. There are 2 left internal jugular central lines, 1 with tip over the cavoatrial junction and the other with tip over the brachiocephalic vein on the left. No pneumothorax. Heart size is normal. Lungs are clear.  IMPRESSION: Lines and tubes as described   Electronically Signed   By: Skipper Cliche M.D.   On: 08/20/2014 07:32   Dg Chest Port 1 View  08/19/2014   CLINICAL DATA:  Intubation.  EXAM: PORTABLE CHEST - 1 VIEW  COMPARISON:  08/19/2014.  FINDINGS: Endotracheal tube noted 2.7 cm above the to the carina. NG tube noted with tip below the left hemidiaphragm. Left IJ line noted with the tip in superior vena cava. Mediastinum and hilar structures normal the lungs are clear. No focal infiltrate. Heart size normal. No acute bony abnormality.  IMPRESSION: 1. Endotracheal tube, NG tube, and left IJ line in good anatomic position. 2. No acute cardiopulmonary disease.   Electronically Signed   By: Marcello Moores  Register   On: 08/19/2014 12:25   Dg Chest Port 1 View  08/19/2014   CLINICAL DATA:  Intubation.  EXAM: PORTABLE CHEST - 1 VIEW  COMPARISON:  01/20/2014  FINDINGS: Endotracheal tube tip lies 3.3 cm above the carina.  Stable upper lobe scarring. No lung consolidation or edema. No pleural effusion or pneumothorax.  Cardiac silhouette is normal in size. No mediastinal or hilar masses.  There is a fracture of the proximal right humerus not evident previously. There is no significant healing. This may be acute or subacute.  IMPRESSION: 1. Endotracheal tube is well positioned. 2. No acute cardiopulmonary disease. 3. Fracture of the proximal right humerus.   Electronically Signed   By: Lajean Manes M.D.   On: 08/19/2014 10:45   Dg Abd Portable 1v  08/19/2014   CLINICAL DATA:  Orogastric tube placement.  EXAM: PORTABLE ABDOMEN - 1 VIEW  COMPARISON:  None.  FINDINGS: Orogastric tube tip lies in the mid stomach in  the left upper quadrant.  Bowel gas pattern is unremarkable. There are dense calcifications along a normal caliber abdominal aorta and iliac arteries.  IMPRESSION: Orogastric tube tip lies in the mid stomach.  No acute findings.   Electronically Signed   By: Lajean Manes M.D.   On: 08/19/2014 12:39    Microbiology: Recent Results (from the past 240 hour(s))  MRSA PCR SCREENING     Status: None   Collection Time    08/19/14 12:57 PM      Result Value Ref Range Status   MRSA by PCR NEGATIVE  NEGATIVE Final   Comment:            The GeneXpert MRSA Assay (FDA     approved for NASAL specimens     only), is one component of a     comprehensive MRSA colonization     surveillance program. It is not     intended to diagnose MRSA     infection nor to guide or     monitor treatment for     MRSA infections.     Labs: Basic Metabolic Panel:  Recent Labs Lab 08/19/14 1117 08/20/14 0405 08/21/14 0412 08/22/14 0555 08/23/14 0418 08/24/14 1005 08/26/14 0440  NA 141 143 139 140 143 143 140  K 4.1 3.7 3.7 4.8 5.0 4.1 3.8  CL 105 110 101 105 107 104 102  CO2 22 17* 19 21 24 25 24   GLUCOSE 215* 144* 181* 166* 161* 191* 182*  BUN 24* 26* 32* 37* 42*  46* 36*  CREATININE 0.96 1.11* 1.24* 1.27* 1.35* 1.45* 1.40*  CALCIUM 7.9* 7.7* 9.4 9.8 10.0 9.0 8.8  MG 1.4* 1.2*  --  1.8  --   --   --   PHOS 4.1 3.5  --  4.1  --   --   --    Liver Function Tests:  Recent Labs Lab 08/19/14 1117  AST 41*  ALT 25  ALKPHOS 76  BILITOT 0.7  PROT 5.2*  ALBUMIN 2.5*   No results found for this basename: LIPASE, AMYLASE,  in the last 168 hours  Recent Labs Lab 08/23/14 0418  AMMONIA 19   CBC:  Recent Labs Lab 08/21/14 0412 08/22/14 0555 08/23/14 0418 08/24/14 1005 08/26/14 0440  WBC 17.0* 13.5* 11.1* 8.1 8.0  NEUTROABS 15.6*  --  10.1*  --   --   HGB 8.2* 7.9* 8.3* 8.3* 8.1*  HCT 25.1* 24.1* 25.8* 25.4* 25.2*  MCV 98.0 99.6 102.0* 101.6* 98.1  PLT 340 303 334 328 388   Cardiac  Enzymes:  Recent Labs Lab 08/19/14 1113 08/19/14 1840 08/19/14 2245 08/20/14 0415  TROPONINI <0.30 0.77* 0.98* 1.13*   BNP: BNP (last 3 results)  Recent Labs  01/20/14 1353 08/23/14 0418  PROBNP 5546.0* 15994.0*   CBG:  Recent Labs Lab 08/25/14 1133 08/25/14 1623 08/25/14 2118 08/26/14 0022 08/26/14 0607  GLUCAP 142* 122* 75 216* 175*       Signed:  Shaylin Blatt  [facility] 08/26/2014, 10:49 AM

## 2014-08-26 NOTE — Clinical Social Work Note (Signed)
Clinical Social Worker facilitated patient discharge including contacting patient family and facility to confirm patient discharge plans.  Clinical information faxed to facility and family agreeable with plan.  CSW arranged transport via patient niece to [facility].  RN to call report prior to discharge.  Clinical Social Worker will sign off for now as social work intervention is no longer needed. Please consult [place] again if new need arises.  Barbette Or, [place]

## 2014-08-28 ENCOUNTER — Non-Acute Institutional Stay (SKILLED_NURSING_FACILITY): Payer: Medicare Other | Admitting: Adult Health

## 2014-08-28 ENCOUNTER — Encounter: Payer: Self-pay | Admitting: Adult Health

## 2014-08-28 DIAGNOSIS — Z0389 Encounter for observation for other suspected diseases and conditions ruled out: Secondary | ICD-10-CM

## 2014-08-28 DIAGNOSIS — E1029 Type 1 diabetes mellitus with other diabetic kidney complication: Secondary | ICD-10-CM

## 2014-08-28 DIAGNOSIS — S42301A Unspecified fracture of shaft of humerus, right arm, initial encounter for closed fracture: Secondary | ICD-10-CM | POA: Insufficient documentation

## 2014-08-28 DIAGNOSIS — S42309A Unspecified fracture of shaft of humerus, unspecified arm, initial encounter for closed fracture: Secondary | ICD-10-CM

## 2014-08-28 DIAGNOSIS — J189 Pneumonia, unspecified organism: Secondary | ICD-10-CM | POA: Insufficient documentation

## 2014-08-28 DIAGNOSIS — I10 Essential (primary) hypertension: Secondary | ICD-10-CM

## 2014-08-28 DIAGNOSIS — R7989 Other specified abnormal findings of blood chemistry: Secondary | ICD-10-CM

## 2014-08-28 DIAGNOSIS — R778 Other specified abnormalities of plasma proteins: Secondary | ICD-10-CM

## 2014-08-28 NOTE — Progress Notes (Signed)
Patient ID: ARALYNN BRAKE, female   DOB: 1941/04/03, 73 y.o.   MRN: [id number]               PROGRESS NOTE  DATE: 08/28/2014  FACILITY: Nursing Home Location: [facility]  LEVEL OF CARE: SNF (31)  Acute Visit  CHIEF COMPLAINT:  Follow-up Hospitalization  HISTORY OF PRESENT ILLNESS: This is a 73 year old female who has been admitted to [facility] on 08/26/14 from [facility] with Acute Respiratory Failure. She has been admitted for a short-term rehabilitation.  REASSESSMENT OF ONGOING PROBLEM(S):  HTN: Pt 's HTN remains stable.  Denies CP, sob, DOE, pedal edema, headaches, dizziness or visual disturbances.  No complications from the medications currently being used.  Last BP : 128/56  DM:pt's DM remains stable.  Pt denies polyuria, polydipsia, polyphagia, changes in vision or hypoglycemic episodes.  No complications noted from the medication presently being used.   6/15  hemoglobin A1c is: 9.3  PNEUMONIA: The pneumonia remains stable.  The patient denies ongoing chest pain, cough, shortness of breath, fever, chills or night sweats. No complications reported from the current antibiotic being used.   PAST MEDICAL HISTORY : Reviewed.  No changes/see problem list  CURRENT MEDICATIONS: Reviewed per MAR/see medication list  REVIEW OF SYSTEMS:  GENERAL: no change in appetite, no fatigue, no weight changes, no fever, chills or weakness RESPIRATORY: no cough, SOB, DOE, wheezing, hemoptysis CARDIAC: no chest pain, or palpitations, +edema GI: no abdominal pain, diarrhea, constipation, heart burn, nausea or vomiting  PHYSICAL EXAMINATION  GENERAL: no acute distress, normal body habitus EYES: conjunctivae normal, sclerae normal, normal eye lids NECK: supple, trachea midline, no neck masses, no thyroid tenderness, no thyromegaly LYMPHATICS: no LAN in the neck, no supraclavicular LAN RESPIRATORY: breathing is even & unlabored, BS CTAB CARDIAC: RRR, no murmur,no  extra heart sounds, RUE edema 2+ GI: abdomen soft, normal BS, no masses, no tenderness, no hepatomegaly, no splenomegaly EXTREMITIES:  Right arm on sling PSYCHIATRIC: the patient is alert & oriented to person, affect & behavior appropriate  LABS/RADIOLOGY: Labs reviewed: Basic Metabolic Panel:  Recent Labs  08/19/14 1117 08/20/14 0405  08/22/14 0555 08/23/14 0418 08/24/14 1005 08/26/14 0440  NA 141 143  < > 140 143 143 140  K 4.1 3.7  < > 4.8 5.0 4.1 3.8  CL 105 110  < > 105 107 104 102  CO2 22 17*  < > 21 24 25 24   GLUCOSE 215* 144*  < > 166* 161* 191* 182*  BUN 24* 26*  < > 37* 42* 46* 36*  CREATININE 0.96 1.11*  < > 1.27* 1.35* 1.45* 1.40*  CALCIUM 7.9* 7.7*  < > 9.8 10.0 9.0 8.8  MG 1.4* 1.2*  --  1.8  --   --   --   PHOS 4.1 3.5  --  4.1  --   --   --   < > = values in this interval not displayed. Liver Function Tests:  Recent Labs  02/24/14 1214 06/02/14 1309 08/19/14 1117  AST 14 14 41*  ALT 8 10 25   ALKPHOS 69 86 76  BILITOT 0.5 0.6 0.7  PROT 6.1 6.1 5.2*  ALBUMIN 3.5 3.3* 2.5*    Recent Labs  08/23/14 0418  AMMONIA 19   CBC:  Recent Labs  06/02/14 1309  08/21/14 0412  08/23/14 0418 08/24/14 1005 08/26/14 0440  WBC 7.6  < > 17.0*  < > 11.1* 8.1 8.0  NEUTROABS 6.0  --  15.6*  --  10.1*  --   --   HGB 10.8*  < > 8.2*  < > 8.3* 8.3* 8.1*  HCT 31.9*  < > 25.1*  < > 25.8* 25.4* 25.2*  MCV 97.3  < > 98.0  < > 102.0* 101.6* 98.1  PLT 300  < > 340  < > 334 328 388  < > = values in this interval not displayed.  Lipid Panel:  Recent Labs  11/26/13 1025 02/24/14 1214 06/02/14 1309  HDL 47 48 46   Cardiac Enzymes:  Recent Labs  08/19/14 1840 08/19/14 2245 08/20/14 0415  TROPONINI 0.77* 0.98* 1.13*   CBG:  Recent Labs  08/26/14 0022 08/26/14 0607 08/26/14 1129  GLUCAP 216* 175* 182*    EXAM: RIGHT SHOULDER - 2+ VIEW   COMPARISON:  None   FINDINGS: There is an oblique fracture of the right humeral neck. No dislocation. The  scapula appears intact.   IMPRESSION: Fracture of the right humeral neck. EXAM: PORTABLE CHEST - 1 VIEW   COMPARISON:  Chest x-ray 08/20/2014.  As CT chest 01/20/2014.   FINDINGS: Interim removal of endotracheal tube, NG tube, and IJ line. Mediastinum and hilar structures are normal. Mild bilateral upper lobe infiltrates noted consistent with pneumonia. Small left base infiltrate cannot be excluded. Small left pleural effusion. No pneumothorax. Mild cardiomegaly with normal pulmonary vascularity. No acute bony abnormality.   IMPRESSION: 1. Interim removal of endotracheal tube, NG tube, and IJ line. 2. Mild bilateral upper lobe and probable left lower lobe infiltrates. 3. Small left pleural effusion. EXAM: CT HEAD WITHOUT CONTRAST   TECHNIQUE: Contiguous axial images were obtained from the base of the skull through the vertex without intravenous contrast.   COMPARISON:  02/19/2013   FINDINGS: The bony calvarium is intact. Mild atrophic changes and mild chronic white matter ischemic change is seen. No findings to suggest acute hemorrhage, acute infarction or space-occupying mass lesion are noted.   IMPRESSION: Chronic changes without acute abnormality.    ASSESSMENT/PLAN:  CAD status post MI - stable Diabetes mellitus with stage III CKD - continue Novolog and Levemir Hypertension - well controlled; continue hydralazine and Zebeta Displaced fracture of right humerus status post ORIF - nonoperative management due to medical issues; continue sling Healthcare acquired pneumonia - continue Levaquin   CPT CODE: 18841  Tomi Paddock Vargas - NP [facility] [phone]

## 2014-08-31 ENCOUNTER — Non-Acute Institutional Stay (SKILLED_NURSING_FACILITY): Payer: Medicare Other | Admitting: Internal Medicine

## 2014-08-31 DIAGNOSIS — S42309A Unspecified fracture of shaft of humerus, unspecified arm, initial encounter for closed fracture: Secondary | ICD-10-CM

## 2014-08-31 DIAGNOSIS — E1029 Type 1 diabetes mellitus with other diabetic kidney complication: Secondary | ICD-10-CM

## 2014-08-31 DIAGNOSIS — Z0389 Encounter for observation for other suspected diseases and conditions ruled out: Secondary | ICD-10-CM

## 2014-08-31 DIAGNOSIS — J189 Pneumonia, unspecified organism: Secondary | ICD-10-CM

## 2014-08-31 DIAGNOSIS — S42301A Unspecified fracture of shaft of humerus, right arm, initial encounter for closed fracture: Secondary | ICD-10-CM

## 2014-08-31 NOTE — Progress Notes (Signed)
HISTORY & PHYSICAL  DATE: 08/31/2014   FACILITY: [facility]  LEVEL OF CARE: SNF (31)  ALLERGIES:  Allergies  Allergen Reactions  . Actonel [Risedronate Sodium]     Dyspepsia  . Codeine Nausea Only and Other (See Comments)    Nightmares  . Hyosol-Sl [Hyoscyamine] Other (See Comments)    Dry mouth  . Metformin And Related Diarrhea  . Phenobarbital Diarrhea  . Ace Inhibitors Rash  . Dilaudid [Hydromorphone Hcl] Rash  . Quinine Derivatives Rash  . Sulfur Rash  . Tetracyclines & Related Rash    Skin peeled off     CHIEF COMPLAINT:  Manage pneumonia, right humerus fracture and diabetes mellitus  HISTORY OF PRESENT ILLNESS: 73 year old Caucasian female was hospitalized with acute respiratory failure. After hospitalization she admitted to this facility for short-term rehabilitation.  PNEUMONIA: The pneumonia remains stable.  The patient denies ongoing chest pain, cough, shortness of breath, fever, chills or night sweats. No complications reported from the current antibiotic being used.  HUMERUS FRACTURE: Patient fell and sustained a humeral fracture. Orthopedics recommended non-operative management due to medical issues.. Currently patient wears a sling. Patient is admitted to this facility for short-term rehabilitation. Patient denies current pain. No complications reported from the pain medications presently being used.  DM:pt's DM remains stable.  Pt denies polyuria, polydipsia, polyphagia, changes in vision or hypoglycemic episodes.  No complications noted from the medication presently being used.  Last hemoglobin A1c is: Not available.  PAST MEDICAL HISTORY :  Past Medical History  Diagnosis Date  . Depression   . Diverticulosis   . Adenomatous colon polyp   . Asthma   . Hypertension   . Mixed hyperlipidemia   . Type II or unspecified type diabetes mellitus without mention of complication, not stated as uncontrolled   . Anemia   . GERD  (gastroesophageal reflux disease)   . Vitamin D deficiency   . Glaucoma   . Diverticulitis   . Heart murmur   . Pneumonia   . Cancer     basal cell carcinoma of the skin    PAST SURGICAL HISTORY: Past Surgical History  Procedure Laterality Date  . Abdominal hysterectomy    . Breast biopsy    . Cyst removed from lower spine    . Lithotripsy    . Eye surgery Bilateral 09/2003 Dr. Katy Fitch     Glaucoma    SOCIAL HISTORY:  reports that she quit smoking about 11 years ago. She has never used smokeless tobacco. She reports that she drinks alcohol. She reports that she does not use illicit drugs.  FAMILY HISTORY:  Family History  Problem Relation Age of Onset  . Breast cancer Sister   . Diabetes Sister   . Heart disease Sister   . Diabetes Mother   . Hypertension Mother   . Heart disease Mother   . Hyperlipidemia Sister   . Arthritis Brother   . Diabetes Sister   . Esophageal cancer Neg Hx   . Rectal cancer Neg Hx   . Stomach cancer Neg Hx   . Colon cancer Neg Hx   . Heart disease Father   . Diabetes Brother     CURRENT MEDICATIONS: Reviewed per MAR/see medication list  REVIEW OF SYSTEMS:  See HPI otherwise 14 point ROS is negative.  PHYSICAL EXAMINATION  VS:  See VS section  GENERAL: no acute distress, normal body habitus EYES: conjunctivae normal, sclerae normal, normal eye lids  MOUTH/THROAT: lips without lesions,no lesions in the mouth,tongue is without lesions,uvula elevates in midline NECK: supple, trachea midline, no neck masses, no thyroid tenderness, no thyromegaly LYMPHATICS: no LAN in the neck, no supraclavicular LAN RESPIRATORY: breathing is even & unlabored, BS decreased CARDIAC: RRR, no murmur,no extra heart sounds, no edema GI:  ABDOMEN: abdomen soft, normal BS, no masses, no tenderness  LIVER/SPLEEN: no hepatomegaly, no splenomegaly MUSCULOSKELETAL: HEAD: normal to inspection  EXTREMITIES: LEFT UPPER EXTREMITY: full range of motion, normal  strength & tone RIGHT UPPER EXTREMITY:  Not tested-in sling LEFT LOWER EXTREMITY: Moderate range of motion, normal strength & tone RIGHT LOWER EXTREMITY: Moderate range of motion, normal strength & tone PSYCHIATRIC: the patient is alert & oriented to person, affect & behavior appropriate  LABS/RADIOLOGY:  Labs reviewed: Basic Metabolic Panel:  Recent Labs  08/19/14 1117 08/20/14 0405  08/22/14 0555 08/23/14 0418 08/24/14 1005 08/26/14 0440  NA 141 143  < > 140 143 143 140  K 4.1 3.7  < > 4.8 5.0 4.1 3.8  CL 105 110  < > 105 107 104 102  CO2 22 17*  < > 21 24 25 24   GLUCOSE 215* 144*  < > 166* 161* 191* 182*  BUN 24* 26*  < > 37* 42* 46* 36*  CREATININE 0.96 1.11*  < > 1.27* 1.35* 1.45* 1.40*  CALCIUM 7.9* 7.7*  < > 9.8 10.0 9.0 8.8  MG 1.4* 1.2*  --  1.8  --   --   --   PHOS 4.1 3.5  --  4.1  --   --   --   < > = values in this interval not displayed. Liver Function Tests:  Recent Labs  02/24/14 1214 06/02/14 1309 08/19/14 1117  AST 14 14 41*  ALT 8 10 25   ALKPHOS 69 86 76  BILITOT 0.5 0.6 0.7  PROT 6.1 6.1 5.2*  ALBUMIN 3.5 3.3* 2.5*    Recent Labs  08/23/14 0418  AMMONIA 19   CBC:  Recent Labs  06/02/14 1309  08/21/14 0412  08/23/14 0418 08/24/14 1005 08/26/14 0440  WBC 7.6  < > 17.0*  < > 11.1* 8.1 8.0  NEUTROABS 6.0  --  15.6*  --  10.1*  --   --   HGB 10.8*  < > 8.2*  < > 8.3* 8.3* 8.1*  HCT 31.9*  < > 25.1*  < > 25.8* 25.4* 25.2*  MCV 97.3  < > 98.0  < > 102.0* 101.6* 98.1  PLT 300  < > 340  < > 334 328 388  < > = values in this interval not displayed.  Lipid Panel:  Recent Labs  11/26/13 1025 02/24/14 1214 06/02/14 1309  HDL 47 48 46   Cardiac Enzymes:  Recent Labs  08/19/14 1840 08/19/14 2245 08/20/14 0415  TROPONINI 0.77* 0.98* 1.13*   CBG:  Recent Labs  08/26/14 0022 08/26/14 0607 08/26/14 1129  GLUCAP 216* 175* 182*    Transthoracic Echocardiography  Patient:    Tika, Hannis MR #:       [id number] Study  Date: 08/19/2014 Gender:     F Age:        71 Height:     154.9 cm Weight:     58.5 kg BSA:        1.6 m^2 Pt. Status: Room:       [place]  Tresa Res, RDCS  ADMITTING    Nita Sells  ATTENDING  Nita Sells  PERFORMING   [facility], Inpatient  ORDERING     Desai, Rahul P  REFERRING    Shearon Stalls, Rahul P  cc:  ------------------------------------------------------------------- LV EF: 55% -   60%  ------------------------------------------------------------------- Indications:      Chest pain 786.51.  ------------------------------------------------------------------- History:   Risk factors:  Hypertension.  ------------------------------------------------------------------- Study Conclusions  - Left ventricle: The cavity size was normal. Wall thickness was   increased in a pattern of mild LVH. Systolic function was normal.   The estimated ejection fraction was in the range of 55% to 60%.   Wall motion was normal; there were no regional wall motion   abnormalities. - Aortic valve: There was mild stenosis. Valve area (VTI): 1.39   cm^2. Valve area (Vmax): 1.25 cm^2. - Left atrium: The atrium was mildly to moderately dilated.  Transthoracic echocardiography.  M-mode, complete 2D, spectral Doppler, and color Doppler.  Birthdate:  Patient birthdate: 26-Nov-1941.  Age:  Patient is 73 yr old.  Sex:  Gender: female. BMI: 24.4 kg/m^2.  Blood pressure:     107/34  Patient status: Inpatient.  Study date:  Study date: 08/19/2014. Study time: 11:58 AM.  Location:  ICU/CCU  -------------------------------------------------------------------  ------------------------------------------------------------------- Left ventricle:  The cavity size was normal. Wall thickness was increased in a pattern of mild LVH. Systolic function was normal. The estimated ejection fraction was in the range of 55% to 60%. Wall motion was normal; there were no regional wall  motion abnormalities.  ------------------------------------------------------------------- Aortic valve:   Moderately thickened leaflets.  Doppler:   There was mild stenosis.      Valve area (VTI): 1.39 cm^2. Indexed valve area (VTI): 0.87 cm^2/m^2. Peak velocity ratio of LVOT to aortic valve: 0.55. Valve area (Vmax): 1.25 cm^2. Indexed valve area (Vmax): 0.78 cm^2/m^2.    Peak gradient (S): 20 mm Hg.  ------------------------------------------------------------------- Aorta:  Aortic root: The aortic root was normal in size. Ascending aorta: The ascending aorta was normal in size.  ------------------------------------------------------------------- Mitral valve:   Structurally normal valve.   Leaflet separation was normal.  Doppler:  Transvalvular velocity was within the normal range. There was no evidence for stenosis. There was trivial regurgitation.    Peak gradient (D): 3 mm Hg.  ------------------------------------------------------------------- Left atrium:  The atrium was mildly to moderately dilated.   ------------------------------------------------------------------- Right ventricle:  The cavity size was normal. Systolic function was normal.  ------------------------------------------------------------------- Pulmonic valve:    The valve appears to be grossly normal. Doppler:  There was no significant regurgitation.  ------------------------------------------------------------------- Tricuspid valve:   Structurally normal valve.   Leaflet separation was normal.  Doppler:  Transvalvular velocity was within the normal range. There was trivial regurgitation.  ------------------------------------------------------------------- Right atrium:  The atrium was normal in size.  ------------------------------------------------------------------- Pericardium:  There was no pericardial  effusion.  ------------------------------------------------------------------- Measurements   Left ventricle                            Value          Reference  LV ID, ED, PLAX chordal           (L)     37.7  mm       43 - 52  LV ID, ES, PLAX chordal                   30.1  mm       23 - 38  LV fx shortening, PLAX  chordal    (L)     20    %        >=29  LV PW thickness, ED                       10.7  mm       ---------  IVS/LV PW ratio, ED                       1.18           <=1.3  Stroke volume, 2D                         71    ml       ---------  Stroke volume/bsa, 2D                     44    ml/m^2   ---------  LV e&', lateral                            5     cm/s     ---------  LV E/e&', lateral                          16.58          ---------  LV e&', medial                             4.24  cm/s     ---------  LV E/e&', medial                           19.55          ---------  LV e&', average                            4.62  cm/s     ---------  LV E/e&', average                          17.94          ---------    Ventricular septum                        Value          Reference  IVS thickness, ED                         12.6  mm       ---------    LVOT                                      Value          Reference  LVOT ID, S                                17    mm       ---------  LVOT area  2.27  cm^2     ---------  LVOT peak velocity, S                     123   cm/s     ---------  LVOT peak gradient, S                     6     mm Hg    ---------    Aortic valve                              Value          Reference  Aortic valve peak velocity, S             224   cm/s     ---------  Aortic peak gradient, S                   20    mm Hg    ---------  Aortic valve area, VTI                    1.39  cm^2     ---------  Aortic valve area/bsa, VTI                0.87  cm^2/m^2 ---------  Velocity ratio, peak, LVOT/AV             0.55            ---------  Aortic valve area, peak velocity          1.25  cm^2     ---------  Aortic valve area/bsa, peak               0.78  cm^2/m^2 ---------  velocity    Aorta                                     Value          Reference  Aortic root ID, ED                        26    mm       ---------    Left atrium                               Value          Reference  LA ID, A-P, ES                            46    mm       ---------  LA ID/bsa, A-P                    (H)     2.88  cm/m^2   <=2.2    Mitral valve                              Value          Reference  Mitral E-wave peak velocity               82.9  cm/s     ---------  Mitral A-wave peak velocity               82.9  cm/s     ---------  Mitral deceleration time                  187   ms       150 - 230  Mitral peak gradient, D                   3     mm Hg    ---------  Mitral E/A ratio, peak                    1              ---------  RIGHT SHOULDER - 2+ VIEW   COMPARISON:  None   FINDINGS: There is an oblique fracture of the right humeral neck. No dislocation. The scapula appears intact.   IMPRESSION: Fracture of the right humeral neck.   PORTABLE ABDOMEN - 1 VIEW   COMPARISON:  None.   FINDINGS: Orogastric tube tip lies in the mid stomach in the left upper quadrant.   Bowel gas pattern is unremarkable. There are dense calcifications along a normal caliber abdominal aorta and iliac arteries.   IMPRESSION: Orogastric tube tip lies in the mid stomach.  No acute findings. PORTABLE CHEST - 1 VIEW   COMPARISON:  Chest x-ray 08/20/2014.  As CT chest 01/20/2014.   FINDINGS: Interim removal of endotracheal tube, NG tube, and IJ line. Mediastinum and hilar structures are normal. Mild bilateral upper lobe infiltrates noted consistent with pneumonia. Small left base infiltrate cannot be excluded. Small left pleural effusion. No pneumothorax. Mild cardiomegaly with normal pulmonary vascularity. No acute bony  abnormality.   IMPRESSION: 1. Interim removal of endotracheal tube, NG tube, and IJ line. 2. Mild bilateral upper lobe and probable left lower lobe infiltrates. 3. Small left pleural effusion. CT HEAD WITHOUT CONTRAST   TECHNIQUE: Contiguous axial images were obtained from the base of the skull through the vertex without intravenous contrast.   COMPARISON:  02/19/2013   FINDINGS: The bony calvarium is intact. Mild atrophic changes and mild chronic white matter ischemic change is seen. No findings to suggest acute hemorrhage, acute infarction or space-occupying mass lesion are noted.   IMPRESSION: Chronic changes without acute abnormality.  ASSESSMENT/PLAN:  Pneumonia-continue Levaquin as prescribed Right humerus fracture-continue sling and rehabilitation Diabetes mellitus-continue Levemir CAD-stable Hypertension-blood pressure elevated. Will monitor. Anemia-check hemoglobin Check CBC and BMP  I have reviewed patient's medical records received at admission/from hospitalization.  CPT CODE: 26203  Gayani Y Dasanayaka, [place] [phone]

## 2014-09-03 ENCOUNTER — Encounter: Payer: Self-pay | Admitting: Internal Medicine

## 2014-09-14 ENCOUNTER — Non-Acute Institutional Stay (SKILLED_NURSING_FACILITY): Payer: Medicare Other | Admitting: Adult Health

## 2014-09-14 ENCOUNTER — Encounter: Payer: Self-pay | Admitting: Adult Health

## 2014-09-14 DIAGNOSIS — D638 Anemia in other chronic diseases classified elsewhere: Secondary | ICD-10-CM

## 2014-09-14 DIAGNOSIS — S42301A Unspecified fracture of shaft of humerus, right arm, initial encounter for closed fracture: Secondary | ICD-10-CM

## 2014-09-14 DIAGNOSIS — I10 Essential (primary) hypertension: Secondary | ICD-10-CM

## 2014-09-14 DIAGNOSIS — S42309A Unspecified fracture of shaft of humerus, unspecified arm, initial encounter for closed fracture: Secondary | ICD-10-CM

## 2014-09-14 DIAGNOSIS — Z0389 Encounter for observation for other suspected diseases and conditions ruled out: Secondary | ICD-10-CM

## 2014-09-14 DIAGNOSIS — E1029 Type 1 diabetes mellitus with other diabetic kidney complication: Secondary | ICD-10-CM

## 2014-09-14 NOTE — Progress Notes (Signed)
Patient ID: Brandy Short, female   DOB: 26-Oct-1941, 73 y.o.   MRN: [id number]              PROGRESS NOTE  DATE:   09/14/14  FACILITY: Nursing Home Location: [facility]  LEVEL OF CARE: SNF (31)  Acute Visit  CHIEF COMPLAINT:  Discharge Notes  HISTORY OF PRESENT ILLNESS: This is a 73 year old female who is for discharge home with Home health PT, SW and Home health Aide. She has been admitted to [facility] on 08/26/14 from [facility] with Acute Respiratory Failure. Patient was admitted to this facility for short-term rehabilitation after the patient's recent hospitalization.  Patient has completed SNF rehabilitation and therapy has cleared the patient for discharge.  REASSESSMENT OF ONGOING PROBLEM(S):  HTN: Pt 's HTN is  unstable.  Denies CP, sob, DOE, pedal edema, headaches, dizziness or visual disturbances.  No complications from the medications currently being used.   BPs : 154/68, 150/70, 160/80  CAD: The angina has been stable. The patient denies dyspnea on exertion, orthopnea, pedal edema, palpitations and paroxysmal nocturnal dyspnea. No complications noted from the medication presently being used.  DM:pt's DM remains stable.  Pt denies polyuria, polydipsia, polyphagia, changes in vision or hypoglycemic episodes.  No complications noted from the medication presently being used.   6/15  hemoglobin A1c is: 9.3  PAST MEDICAL HISTORY : Reviewed.  No changes/see problem list  CURRENT MEDICATIONS: Reviewed per MAR/see medication list  REVIEW OF SYSTEMS:  GENERAL: no change in appetite, no fatigue, no weight changes, no fever, chills or weakness RESPIRATORY: no cough, SOB, DOE, wheezing, hemoptysis CARDIAC: no chest pain, or palpitations, +edema GI: no abdominal pain, diarrhea, constipation, heart burn, nausea or vomiting  PHYSICAL EXAMINATION  GENERAL: no acute distress, normal body habitus NECK: supple, trachea midline, no neck masses, no thyroid  tenderness, no thyromegaly LYMPHATICS: no LAN in the neck, no supraclavicular LAN RESPIRATORY: breathing is even & unlabored, BS CTAB CARDIAC: RRR, no murmur,no extra heart sounds, RUE edema 2+ GI: abdomen soft, normal BS, no masses, no tenderness, no hepatomegaly, no splenomegaly EXTREMITIES:  Right arm on sling PSYCHIATRIC: the patient is alert & oriented to person, affect & behavior appropriate  LABS/RADIOLOGY: 09/10/14  WBC 9.5 hemoglobin 7.7 hematocrit 25.2 MCV 106.3 sodium 136 potassium 4.1  glucose 196 BUN 26 creatinine 1.1 calcium 8.9 09/02/14  WBC 7.9 hemoglobin 8.3 hematocrit and type II and 6.5 MCV at 5.6 Labs reviewed: Basic Metabolic Panel:  Recent Labs  08/19/14 1117 08/20/14 0405  08/22/14 0555 08/23/14 0418 08/24/14 1005 08/26/14 0440  NA 141 143  < > 140 143 143 140  K 4.1 3.7  < > 4.8 5.0 4.1 3.8  CL 105 110  < > 105 107 104 102  CO2 22 17*  < > 21 24 25 24   GLUCOSE 215* 144*  < > 166* 161* 191* 182*  BUN 24* 26*  < > 37* 42* 46* 36*  CREATININE 0.96 1.11*  < > 1.27* 1.35* 1.45* 1.40*  CALCIUM 7.9* 7.7*  < > 9.8 10.0 9.0 8.8  MG 1.4* 1.2*  --  1.8  --   --   --   PHOS 4.1 3.5  --  4.1  --   --   --   < > = values in this interval not displayed. Liver Function Tests:  Recent Labs  02/24/14 1214 06/02/14 1309 08/19/14 1117  AST 14 14 41*  ALT 8 10 25  ALKPHOS 69 86 76  BILITOT 0.5 0.6 0.7  PROT 6.1 6.1 5.2*  ALBUMIN 3.5 3.3* 2.5*    Recent Labs  08/23/14 0418  AMMONIA 19   CBC:  Recent Labs  06/02/14 1309  08/21/14 0412  08/23/14 0418 08/24/14 1005 08/26/14 0440  WBC 7.6  < > 17.0*  < > 11.1* 8.1 8.0  NEUTROABS 6.0  --  15.6*  --  10.1*  --   --   HGB 10.8*  < > 8.2*  < > 8.3* 8.3* 8.1*  HCT 31.9*  < > 25.1*  < > 25.8* 25.4* 25.2*  MCV 97.3  < > 98.0  < > 102.0* 101.6* 98.1  PLT 300  < > 340  < > 334 328 388  < > = values in this interval not displayed.  Lipid Panel:  Recent Labs  11/26/13 1025 02/24/14 1214 06/02/14 1309    HDL 47 48 46   Cardiac Enzymes:  Recent Labs  08/19/14 1840 08/19/14 2245 08/20/14 0415  TROPONINI 0.77* 0.98* 1.13*   CBG:  Recent Labs  08/26/14 0022 08/26/14 0607 08/26/14 1129  GLUCAP 216* 175* 182*    EXAM: RIGHT SHOULDER - 2+ VIEW   COMPARISON:  None   FINDINGS: There is an oblique fracture of the right humeral neck. No dislocation. The scapula appears intact.   IMPRESSION: Fracture of the right humeral neck. EXAM: PORTABLE CHEST - 1 VIEW   COMPARISON:  Chest x-ray 08/20/2014.  As CT chest 01/20/2014.   FINDINGS: Interim removal of endotracheal tube, NG tube, and IJ line. Mediastinum and hilar structures are normal. Mild bilateral upper lobe infiltrates noted consistent with pneumonia. Small left base infiltrate cannot be excluded. Small left pleural effusion. No pneumothorax. Mild cardiomegaly with normal pulmonary vascularity. No acute bony abnormality.   IMPRESSION: 1. Interim removal of endotracheal tube, NG tube, and IJ line. 2. Mild bilateral upper lobe and probable left lower lobe infiltrates. 3. Small left pleural effusion. EXAM: CT HEAD WITHOUT CONTRAST   TECHNIQUE: Contiguous axial images were obtained from the base of the skull through the vertex without intravenous contrast.   COMPARISON:  02/19/2013   FINDINGS: The bony calvarium is intact. Mild atrophic changes and mild chronic white matter ischemic change is seen. No findings to suggest acute hemorrhage, acute infarction or space-occupying mass lesion are noted.   IMPRESSION: Chronic changes without acute abnormality.    ASSESSMENT/PLAN:  CAD  - stable Diabetes mellitus with stage III CKD - continue Novolog and Levemir Hypertension - continue Zebeta and increase Hydralazine to 100 mg PO TID Displaced fracture of right humerus - nonoperative management due to medical issues; continue sling Anemia of chronic disease - stable; check CBC    I have filled out patient's  discharge paperwork and written prescriptions.  Patient will receive home health PT, SW and Home health Aide..   Total discharge time: Greater than 30 minutes  Discharge time involved coordination of the discharge process with social worker, nursing staff and therapy department. Medical justification for home health services verified.    CPT CODE: 84665  Seth Bake - NP [facility] [phone]

## 2014-09-17 ENCOUNTER — Encounter: Payer: Self-pay | Admitting: Adult Health

## 2014-09-17 ENCOUNTER — Non-Acute Institutional Stay (SKILLED_NURSING_FACILITY): Payer: Medicare Other | Admitting: Adult Health

## 2014-09-17 DIAGNOSIS — J45909 Unspecified asthma, uncomplicated: Secondary | ICD-10-CM

## 2014-09-17 DIAGNOSIS — J189 Pneumonia, unspecified organism: Secondary | ICD-10-CM

## 2014-09-17 DIAGNOSIS — I10 Essential (primary) hypertension: Secondary | ICD-10-CM

## 2014-09-17 NOTE — Progress Notes (Signed)
Patient ID: Brandy Short, female   DOB: [birth year], 73 y.o.   MRN: [id number]              PROGRESS NOTE  DATE:      09/17/14  FACILITY: Nursing Home Location: [facility]  LEVEL OF CARE: SNF (31)  Acute Visit  CHIEF COMPLAINT:  Hypertension, HCAP, Chronic asthma  HISTORY OF PRESENT ILLNESS: This is a 73 year old female whose discharge home was postponed and re-scheduled for 09/18/14. Patient had pneumonia, elevated BPs (188/82, 175/62, 160/50) and SOB requiring continuous Oxygen and therefore needing Oxygen for home use.   PAST MEDICAL HISTORY : Reviewed.  No changes/see problem list  CURRENT MEDICATIONS: Reviewed per MAR/see medication list  REVIEW OF SYSTEMS:  GENERAL: no change in appetite, no fatigue, no weight changes, no fever, chills or weakness RESPIRATORY: no hemoptysis, + SOB CARDIAC: no chest pain, or palpitations, +edema GI: no abdominal pain, diarrhea, constipation, heart burn, nausea or vomiting  PHYSICAL EXAMINATION  GENERAL: no acute distress, normal body habitus NECK: supple, trachea midline, no neck masses, no thyroid tenderness, no thyromegaly RESPIRATORY: breathing is even & unlabored with O2 CARDIAC: RRR, no murmur,no extra heart sounds, RUE edema 2+ GI: abdomen soft, normal BS, no masses, no tenderness, no hepatomegaly, no splenomegaly EXTREMITIES:  Right arm on sling PSYCHIATRIC: the patient is alert & oriented to person, affect & behavior appropriate  LABS/RADIOLOGY: 09/15/14  WBC 8.8 hemoglobin 7.5 hematocrit 24.8 MCV 106.4 Chest x-ray shows left basilar atelectatic changes and pneumonia 09/10/14  WBC 9.5 hemoglobin 7.7 hematocrit 25.2 MCV 106.3 sodium 136 potassium 4.1  glucose 196 BUN 26 creatinine 1.1 calcium 8.9 09/02/14  WBC 7.9 hemoglobin 8.3 hematocrit and type II and 6.5 MCV at 5.6 Labs reviewed: Basic Metabolic Panel:  Recent Labs  08/19/14 1117 08/20/14 0405  08/22/14 0555 08/23/14 0418 08/24/14 1005 08/26/14 0440    NA 141 143  < > 140 143 143 140  K 4.1 3.7  < > 4.8 5.0 4.1 3.8  CL 105 110  < > 105 107 104 102  CO2 22 17*  < > 21 24 25 24   GLUCOSE 215* 144*  < > 166* 161* 191* 182*  BUN 24* 26*  < > 37* 42* 46* 36*  CREATININE 0.96 1.11*  < > 1.27* 1.35* 1.45* 1.40*  CALCIUM 7.9* 7.7*  < > 9.8 10.0 9.0 8.8  MG 1.4* 1.2*  --  1.8  --   --   --   PHOS 4.1 3.5  --  4.1  --   --   --   < > = values in this interval not displayed. Liver Function Tests:  Recent Labs  02/24/14 1214 06/02/14 1309 08/19/14 1117  AST 14 14 41*  ALT 8 10 25   ALKPHOS 69 86 76  BILITOT 0.5 0.6 0.7  PROT 6.1 6.1 5.2*  ALBUMIN 3.5 3.3* 2.5*    Recent Labs  08/23/14 0418  AMMONIA 19   CBC:  Recent Labs  06/02/14 1309  08/21/14 0412  08/23/14 0418 08/24/14 1005 08/26/14 0440  WBC 7.6  < > 17.0*  < > 11.1* 8.1 8.0  NEUTROABS 6.0  --  15.6*  --  10.1*  --   --   HGB 10.8*  < > 8.2*  < > 8.3* 8.3* 8.1*  HCT 31.9*  < > 25.1*  < > 25.8* 25.4* 25.2*  MCV 97.3  < > 98.0  < > 102.0* 101.6* 98.1  PLT  300  < > 340  < > 334 328 388  < > = values in this interval not displayed.  Lipid Panel:  Recent Labs  11/26/13 1025 02/24/14 1214 06/02/14 1309  HDL 47 48 46   Cardiac Enzymes:  Recent Labs  08/19/14 1840 08/19/14 2245 08/20/14 0415  TROPONINI 0.77* 0.98* 1.13*   CBG:  Recent Labs  08/26/14 0022 08/26/14 0607 08/26/14 1129  GLUCAP 216* 175* 182*    EXAM: RIGHT SHOULDER - 2+ VIEW   COMPARISON:  None   FINDINGS: There is an oblique fracture of the right humeral neck. No dislocation. The scapula appears intact.   IMPRESSION: Fracture of the right humeral neck. EXAM: PORTABLE CHEST - 1 VIEW   COMPARISON:  Chest x-ray 08/20/2014.  As CT chest 01/20/2014.   FINDINGS: Interim removal of endotracheal tube, NG tube, and IJ line. Mediastinum and hilar structures are normal. Mild bilateral upper lobe infiltrates noted consistent with pneumonia. Small left base infiltrate cannot be  excluded. Small left pleural effusion. No pneumothorax. Mild cardiomegaly with normal pulmonary vascularity. No acute bony abnormality.   IMPRESSION: 1. Interim removal of endotracheal tube, NG tube, and IJ line. 2. Mild bilateral upper lobe and probable left lower lobe infiltrates. 3. Small left pleural effusion. EXAM: CT HEAD WITHOUT CONTRAST   TECHNIQUE: Contiguous axial images were obtained from the base of the skull through the vertex without intravenous contrast.   COMPARISON:  02/19/2013   FINDINGS: The bony calvarium is intact. Mild atrophic changes and mild chronic white matter ischemic change is seen. No findings to suggest acute hemorrhage, acute infarction or space-occupying mass lesion are noted.   IMPRESSION: Chronic changes without acute abnormality.    ASSESSMENT/PLAN:  Hypertension - increase hydralazine to 100 mg 1 tab by mouth TID; BP every shift x1 week HCAP -  Start Avelox 400 mg by mouth daily x10 days Chronic asthma -  she needs oxygen at home at 2 L/minute via [place] continuously; O2 sat drops to 86% on room air and 83% with activity.   CPT CODE: 47425  Seth Bake - NP [facility] [phone]

## 2014-09-19 DIAGNOSIS — S42309D Unspecified fracture of shaft of humerus, unspecified arm, subsequent encounter for fracture with routine healing: Secondary | ICD-10-CM

## 2014-09-19 DIAGNOSIS — N183 Chronic kidney disease, stage 3 unspecified: Secondary | ICD-10-CM

## 2014-09-19 DIAGNOSIS — J189 Pneumonia, unspecified organism: Secondary | ICD-10-CM

## 2014-09-19 DIAGNOSIS — I129 Hypertensive chronic kidney disease with stage 1 through stage 4 chronic kidney disease, or unspecified chronic kidney disease: Secondary | ICD-10-CM

## 2014-09-19 DIAGNOSIS — E1129 Type 2 diabetes mellitus with other diabetic kidney complication: Secondary | ICD-10-CM

## 2014-09-19 DIAGNOSIS — J45909 Unspecified asthma, uncomplicated: Secondary | ICD-10-CM

## 2014-09-20 ENCOUNTER — Encounter (HOSPITAL_COMMUNITY): Payer: Self-pay | Admitting: Emergency Medicine

## 2014-09-20 ENCOUNTER — Inpatient Hospital Stay (HOSPITAL_COMMUNITY)
Admission: EM | Admit: 2014-09-20 | Discharge: 2014-09-24 | DRG: 871 | Disposition: A | Discharge status: Other Acute Inpatient Hospital | Payer: Medicare Other | Attending: Family Medicine | Admitting: Family Medicine

## 2014-09-20 ENCOUNTER — Emergency Department (HOSPITAL_COMMUNITY): Payer: Medicare Other

## 2014-09-20 DIAGNOSIS — G92 Toxic encephalopathy: Secondary | ICD-10-CM | POA: Diagnosis present

## 2014-09-20 DIAGNOSIS — N179 Acute kidney failure, unspecified: Secondary | ICD-10-CM | POA: Diagnosis present

## 2014-09-20 DIAGNOSIS — Z66 Do not resuscitate: Secondary | ICD-10-CM | POA: Diagnosis present

## 2014-09-20 DIAGNOSIS — R451 Restlessness and agitation: Secondary | ICD-10-CM | POA: Diagnosis not present

## 2014-09-20 DIAGNOSIS — R680 Hypothermia, not associated with low environmental temperature: Secondary | ICD-10-CM | POA: Diagnosis present

## 2014-09-20 DIAGNOSIS — N390 Urinary tract infection, site not specified: Secondary | ICD-10-CM

## 2014-09-20 DIAGNOSIS — Z881 Allergy status to other antibiotic agents status: Secondary | ICD-10-CM | POA: Diagnosis not present

## 2014-09-20 DIAGNOSIS — Z515 Encounter for palliative care: Secondary | ICD-10-CM | POA: Diagnosis not present

## 2014-09-20 DIAGNOSIS — I129 Hypertensive chronic kidney disease with stage 1 through stage 4 chronic kidney disease, or unspecified chronic kidney disease: Secondary | ICD-10-CM | POA: Diagnosis present

## 2014-09-20 DIAGNOSIS — Z888 Allergy status to other drugs, medicaments and biological substances status: Secondary | ICD-10-CM | POA: Diagnosis not present

## 2014-09-20 DIAGNOSIS — J9602 Acute respiratory failure with hypercapnia: Secondary | ICD-10-CM | POA: Diagnosis not present

## 2014-09-20 DIAGNOSIS — J189 Pneumonia, unspecified organism: Secondary | ICD-10-CM | POA: Diagnosis not present

## 2014-09-20 DIAGNOSIS — K219 Gastro-esophageal reflux disease without esophagitis: Secondary | ICD-10-CM | POA: Diagnosis present

## 2014-09-20 DIAGNOSIS — J9601 Acute respiratory failure with hypoxia: Secondary | ICD-10-CM | POA: Diagnosis present

## 2014-09-20 DIAGNOSIS — D500 Iron deficiency anemia secondary to blood loss (chronic): Secondary | ICD-10-CM | POA: Diagnosis present

## 2014-09-20 DIAGNOSIS — J90 Pleural effusion, not elsewhere classified: Secondary | ICD-10-CM

## 2014-09-20 DIAGNOSIS — A419 Sepsis, unspecified organism: Principal | ICD-10-CM | POA: Diagnosis present

## 2014-09-20 DIAGNOSIS — B961 Klebsiella pneumoniae [K. pneumoniae] as the cause of diseases classified elsewhere: Secondary | ICD-10-CM | POA: Diagnosis present

## 2014-09-20 DIAGNOSIS — Z87891 Personal history of nicotine dependence: Secondary | ICD-10-CM | POA: Diagnosis not present

## 2014-09-20 DIAGNOSIS — Z882 Allergy status to sulfonamides status: Secondary | ICD-10-CM | POA: Diagnosis not present

## 2014-09-20 DIAGNOSIS — F419 Anxiety disorder, unspecified: Secondary | ICD-10-CM | POA: Diagnosis not present

## 2014-09-20 DIAGNOSIS — Z833 Family history of diabetes mellitus: Secondary | ICD-10-CM

## 2014-09-20 DIAGNOSIS — J439 Emphysema, unspecified: Secondary | ICD-10-CM

## 2014-09-20 DIAGNOSIS — R4182 Altered mental status, unspecified: Secondary | ICD-10-CM | POA: Diagnosis present

## 2014-09-20 DIAGNOSIS — Z8249 Family history of ischemic heart disease and other diseases of the circulatory system: Secondary | ICD-10-CM

## 2014-09-20 DIAGNOSIS — Y95 Nosocomial condition: Secondary | ICD-10-CM | POA: Diagnosis present

## 2014-09-20 DIAGNOSIS — R6520 Severe sepsis without septic shock: Secondary | ICD-10-CM | POA: Diagnosis present

## 2014-09-20 DIAGNOSIS — E782 Mixed hyperlipidemia: Secondary | ICD-10-CM | POA: Diagnosis present

## 2014-09-20 DIAGNOSIS — J438 Other emphysema: Secondary | ICD-10-CM | POA: Diagnosis present

## 2014-09-20 DIAGNOSIS — E119 Type 2 diabetes mellitus without complications: Secondary | ICD-10-CM | POA: Diagnosis present

## 2014-09-20 DIAGNOSIS — N182 Chronic kidney disease, stage 2 (mild): Secondary | ICD-10-CM | POA: Diagnosis present

## 2014-09-20 DIAGNOSIS — Z803 Family history of malignant neoplasm of breast: Secondary | ICD-10-CM

## 2014-09-20 DIAGNOSIS — N12 Tubulo-interstitial nephritis, not specified as acute or chronic: Secondary | ICD-10-CM | POA: Diagnosis present

## 2014-09-20 LAB — BASIC METABOLIC PANEL
ANION GAP: 13 (ref 5–15)
BUN: 36 mg/dL — ABNORMAL HIGH (ref 6–23)
CALCIUM: 9 mg/dL (ref 8.4–10.5)
CO2: 29 mEq/L (ref 19–32)
CREATININE: 1.44 mg/dL — AB (ref 0.50–1.10)
Chloride: 90 mEq/L — ABNORMAL LOW (ref 96–112)
GFR calc Af Amer: 41 mL/min — ABNORMAL LOW (ref >90)
GFR, EST NON AFRICAN AMERICAN: 35 mL/min — AB (ref >90)
Glucose, Bld: 171 mg/dL — ABNORMAL HIGH (ref 70–99)
Potassium: 4.7 mEq/L (ref 3.7–5.3)
SODIUM: 132 meq/L — AB (ref 137–147)

## 2014-09-20 LAB — CBC WITH DIFFERENTIAL/PLATELET
BASOS ABS: 0.1 10*3/uL (ref 0.0–0.1)
Basophils Relative: 1 % (ref 0–1)
EOS PCT: 2 % (ref 0–5)
Eosinophils Absolute: 0.1 10*3/uL (ref 0.0–0.7)
HCT: 25.6 % — ABNORMAL LOW (ref 36.0–46.0)
Hemoglobin: 8.2 g/dL — ABNORMAL LOW (ref 12.0–15.0)
LYMPHS PCT: 3 % — AB (ref 12–46)
Lymphs Abs: 0.3 10*3/uL — ABNORMAL LOW (ref 0.7–4.0)
MCH: 31.7 pg (ref 26.0–34.0)
MCHC: 32 g/dL (ref 30.0–36.0)
MCV: 98.8 fL (ref 78.0–100.0)
Monocytes Absolute: 0.6 10*3/uL (ref 0.1–1.0)
Monocytes Relative: 7 % (ref 3–12)
Neutro Abs: 8.4 10*3/uL — ABNORMAL HIGH (ref 1.7–7.7)
Neutrophils Relative %: 89 % — ABNORMAL HIGH (ref 43–77)
PLATELETS: 399 10*3/uL (ref 150–400)
RBC: 2.59 MIL/uL — ABNORMAL LOW (ref 3.87–5.11)
RDW: 14.3 % (ref 11.5–15.5)
WBC: 9.4 10*3/uL (ref 4.0–10.5)

## 2014-09-20 LAB — I-STAT TROPONIN, ED: TROPONIN I, POC: 0.03 ng/mL (ref 0.00–0.08)

## 2014-09-20 LAB — URINALYSIS, ROUTINE W REFLEX MICROSCOPIC
Bilirubin Urine: NEGATIVE
Glucose, UA: NEGATIVE mg/dL
Hgb urine dipstick: NEGATIVE
Ketones, ur: NEGATIVE mg/dL
Nitrite: NEGATIVE
Protein, ur: 100 mg/dL — AB
Specific Gravity, Urine: 1.017 (ref 1.005–1.030)
UROBILINOGEN UA: 0.2 mg/dL (ref 0.0–1.0)
pH: 5 (ref 5.0–8.0)

## 2014-09-20 LAB — I-STAT CG4 LACTIC ACID, ED: Lactic Acid, Venous: 0.8 mmol/L (ref 0.5–2.2)

## 2014-09-20 LAB — URINE MICROSCOPIC-ADD ON

## 2014-09-20 LAB — PRO B NATRIURETIC PEPTIDE: Pro B Natriuretic peptide (BNP): 3105 pg/mL — ABNORMAL HIGH (ref 0–125)

## 2014-09-20 MED ORDER — DM-GUAIFENESIN ER 30-600 MG PO TB12
1.0000 | ORAL_TABLET | Freq: Two times a day (BID) | ORAL | Status: DC
  Administered 2014-09-20 – 2014-09-24 (×5): 1 via ORAL
  Filled 2014-09-20 (×9): qty 1

## 2014-09-20 MED ORDER — VANCOMYCIN HCL IN DEXTROSE 750-5 MG/150ML-% IV SOLN
750.0000 mg | INTRAVENOUS | Status: DC
  Administered 2014-09-20 – 2014-09-22 (×3): 750 mg via INTRAVENOUS
  Filled 2014-09-20 (×4): qty 150

## 2014-09-20 MED ORDER — LATANOPROST 0.005 % OP SOLN
1.0000 [drp] | Freq: Every day | OPHTHALMIC | Status: DC
Start: 2014-09-20 — End: 2014-09-24
  Administered 2014-09-20 – 2014-09-23 (×4): 1 [drp] via OPHTHALMIC
  Filled 2014-09-20: qty 2.5

## 2014-09-20 MED ORDER — ALBUTEROL SULFATE (2.5 MG/3ML) 0.083% IN NEBU
2.5000 mg | INHALATION_SOLUTION | Freq: Four times a day (QID) | RESPIRATORY_TRACT | Status: DC
  Administered 2014-09-20 – 2014-09-21 (×3): 2.5 mg via RESPIRATORY_TRACT
  Filled 2014-09-20 (×3): qty 3

## 2014-09-20 MED ORDER — DEXTROSE 5 % IV SOLN: 1.0000 g | Freq: Three times a day (TID) | INTRAVENOUS | Status: DC

## 2014-09-20 MED ORDER — BISOPROLOL FUMARATE 5 MG PO TABS
5.0000 mg | ORAL_TABLET | Freq: Every day | ORAL | Status: DC
  Administered 2014-09-20: 5 mg via ORAL
  Filled 2014-09-20 (×2): qty 1

## 2014-09-20 MED ORDER — HYDRALAZINE HCL 50 MG PO TABS
100.0000 mg | ORAL_TABLET | Freq: Three times a day (TID) | ORAL | Status: DC
  Administered 2014-09-20: 100 mg via ORAL
  Filled 2014-09-20 (×5): qty 2

## 2014-09-20 MED ORDER — ALUM & MAG HYDROXIDE-SIMETH 200-200-20 MG/5ML PO SUSP: 15.0000 mL | Freq: Four times a day (QID) | ORAL | Status: DC | PRN

## 2014-09-20 MED ORDER — HYDRALAZINE HCL 100 MG PO TABS: 100.0000 mg | ORAL_TABLET | Freq: Three times a day (TID) | ORAL | Status: DC

## 2014-09-20 MED ORDER — INSULIN DETEMIR 100 UNIT/ML ~~LOC~~ SOLN
10.0000 [IU] | Freq: Every day | SUBCUTANEOUS | Status: DC
  Administered 2014-09-20: 10 [IU] via SUBCUTANEOUS
  Filled 2014-09-20 (×2): qty 0.1

## 2014-09-20 MED ORDER — ONDANSETRON HCL 4 MG/2ML IJ SOLN
4.0000 mg | Freq: Four times a day (QID) | INTRAMUSCULAR | Status: DC | PRN
  Administered 2014-09-20 – 2014-09-23 (×4): 4 mg via INTRAVENOUS
  Filled 2014-09-20 (×4): qty 2

## 2014-09-20 MED ORDER — ALBUTEROL SULFATE (2.5 MG/3ML) 0.083% IN NEBU: 2.5000 mg | INHALATION_SOLUTION | RESPIRATORY_TRACT | Status: DC | PRN

## 2014-09-20 MED ORDER — DEXTROSE 5 % IV SOLN
1.0000 g | Freq: Once | INTRAVENOUS | Status: AC
  Administered 2014-09-20: 1 g via INTRAVENOUS
  Filled 2014-09-20: qty 1

## 2014-09-20 MED ORDER — DEXTROSE 5 % IV SOLN
1.0000 g | INTRAVENOUS | Status: DC
  Administered 2014-09-21 – 2014-09-23 (×3): 1 g via INTRAVENOUS
  Filled 2014-09-20 (×4): qty 1

## 2014-09-20 MED ORDER — SODIUM CHLORIDE 0.9 % IV SOLN
INTRAVENOUS | Status: AC
  Administered 2014-09-20: 23:00:00 via INTRAVENOUS

## 2014-09-20 MED ORDER — DORZOLAMIDE HCL-TIMOLOL MAL 2-0.5 % OP SOLN
2.0000 [drp] | Freq: Two times a day (BID) | OPHTHALMIC | Status: DC
  Administered 2014-09-20 – 2014-09-24 (×8): 2 [drp] via OPHTHALMIC
  Filled 2014-09-20: qty 10

## 2014-09-20 MED ORDER — INSULIN ASPART 100 UNIT/ML ~~LOC~~ SOLN
0.0000 [IU] | Freq: Three times a day (TID) | SUBCUTANEOUS | Status: DC
  Administered 2014-09-21: 2 [IU] via SUBCUTANEOUS

## 2014-09-20 MED ORDER — ENOXAPARIN SODIUM 40 MG/0.4ML ~~LOC~~ SOLN
40.0000 mg | SUBCUTANEOUS | Status: DC
Start: 2014-09-20 — End: 2014-09-21
  Administered 2014-09-20: 40 mg via SUBCUTANEOUS
  Filled 2014-09-20 (×2): qty 0.4

## 2014-09-20 MED ORDER — BRIMONIDINE TARTRATE 0.15 % OP SOLN
1.0000 [drp] | Freq: Two times a day (BID) | OPHTHALMIC | Status: DC
  Filled 2014-09-20: qty 5

## 2014-09-20 MED ORDER — DEXTROSE 5 % IV SOLN: 1.0000 g | INTRAVENOUS | Status: DC

## 2014-09-20 NOTE — Progress Notes (Addendum)
ANTIBIOTIC CONSULT NOTE - INITIAL  Pharmacy Consult for Vancomycin Indication: pneumonia  Allergies  Allergen Reactions  . Codeine Nausea Only and Other (See Comments)    Nightmares  . Ace Inhibitors Rash  . Actonel [Risedronate Sodium] Other (See Comments)    Dyspepsia  . Dilaudid [Hydromorphone Hcl] Rash  . Hyosol-Sl [Hyoscyamine] Other (See Comments)    Dry mouth  . Metformin And Related Diarrhea  . Phenobarbital Diarrhea  . Quinine Derivatives Rash  . Sulfur Rash  . Tetracyclines & Related Rash and Other (See Comments)    Skin peeled off     Patient Measurements:   Adjusted Body Weight: 58.3 kg from 09/17/14  Vital Signs: Temp: 97.4 F (36.3 C) (09/28 1408) Temp src: Rectal (09/28 1408) BP: 173/48 mmHg (09/28 1445) Pulse Rate: 80 (09/28 1445)    Labs:  Recent Labs  09/20/14 1359  WBC 9.4  HGB 8.2*  PLT 399  CREATININE 1.44*   The CrCl is unknown because both a height and weight (above a minimum accepted value) are required for this calculation. No results found for this basename: VANCOTROUGH, VANCOPEAK, VANCORANDOM, GENTTROUGH, GENTPEAK, GENTRANDOM, TOBRATROUGH, TOBRAPEAK, TOBRARND, AMIKACINPEAK, AMIKACINTROU, AMIKACIN,  in the last 72 hours   Microbiology: No results found for this or any previous visit (from the past 720 hour(s)).  Medical History: Past Medical History  Diagnosis Date  . Depression   . Diverticulosis   . Adenomatous colon polyp   . Asthma   . Hypertension   . Mixed hyperlipidemia   . Type II or unspecified type diabetes mellitus without mention of complication, not stated as uncontrolled   . Anemia   . GERD (gastroesophageal reflux disease)   . Vitamin D deficiency   . Glaucoma   . Diverticulitis   . Heart murmur   . Pneumonia   . Cancer     basal cell carcinoma of the skin   Assessment: 73 yo female admitted with confusion and lethargy, pharmacy asked to begin empiric vancomycin for PNA.  Recent admission to [facility].  Scr 1.44, Est. CrCl ~ 39 ml/min  Goal of Therapy:  Vancomycin trough level 15-20 mcg/ml  Plan:  1. Vancomycin 750 mg IV q 24 hrs. 2. Watch renal function and adjust doses as needed. 3. F/u cultures and plan of care.  Uvaldo Rising, BCPS  Clinical Pharmacist Pager [phone]  09/20/2014 4:17 PM    [place] (@1734 ): with crcl~39, will adjust cefepime dose to 1gm IV q24h.  Dia Sitter, PharmD, BCPS

## 2014-09-20 NOTE — ED Provider Notes (Signed)
  Medical screening examination/treatment/procedure(s) were conducted as a shared visit with non-physician practitioner(s) and myself.  I personally evaluated the patient during the encounter.  73 year old female presents to the hospital after a lengthy rehabilitation stay at [facility] place which she was at after being intubated and ventilated in an intensive. When she became acutely short of breath with chest pain during orthopedic surgery approximately one month ago. She states that she has mild shortness of breath but otherwise has no other complaints, the family member states that she has had decreased level of consciousness and is unable to ambulate since coming home from the nursing facility 2 days ago. She has had decreased oral intake and has had very little urinary output with increased shortness of breath. She was prescribed oxygen at the nursing facility and has that at home but still has shortness of breath and decreased level of consciousness. On exam the patient is somnolent but arousable, has mild wheezing, no rales, no increased work of breathing. She has bilateral peripheral edema which is symmetrical, no tenderness of the abdomen and no tachycardia. Her EKG is abnormal but improved compared to EKG from her prior admission to the hospital and likely consistent with left ventricular hypertrophy. Her chest x-ray shows a focal left lower lobe infiltrate, laboratory workup not significant compared to prior labs including her anemia and renal insufficiency. Will obtain an ABG MIPs acid, patient will need treatment for healthcare associated pneumonia and admission to the hospital for her persistent decreased level of consciousness but this time she appears hemodynamically stable without hypotension fever or tachycardia.   EKG Interpretation  Date/Time:  Monday September 20 2014 14:05:01 EDT Ventricular Rate:  84 PR Interval:  190 QRS Duration: 81 QT Interval:  385 QTC Calculation: 455 R  Axis:   35 Text Interpretation:  Sinus rhythm Probable LVH with secondary repol abnrm Since last tracing ST abnormality less dramatic. Abnormal ekg Confirmed by Sabra Heck  MD, [place] ([id number]) on 09/20/2014 3:26:04 PM      Clinical Impression:   Final diagnoses:  HCAP (healthcare-associated pneumonia)  Pleural effusion  UTI (lower urinary tract infection)    Meds given in ED:  Medications  vancomycin (VANCOCIN) IVPB 750 mg/150 ml premix (750 mg Intravenous Given 09/20/14 1650)  ondansetron (ZOFRAN) injection 4 mg (4 mg Intravenous Given 09/20/14 1714)  ceFEPIme (MAXIPIME) 1 g in dextrose 5 % 50 mL IVPB (not administered)  ceFEPIme (MAXIPIME) 1 g in dextrose 5 % 50 mL IVPB (0 g Intravenous Stopped 09/20/14 1647)         Johnna Acosta, MD 09/20/14 1821

## 2014-09-20 NOTE — H&P (Addendum)
[facility] History and Physical  SHADAI MCCLANE WGN:[id number] DOB: 11-23-1941 DOA: 09/20/2014  Referring physician: ED physician PCP: Alesia Richards, MD   Chief Complaint: lethargy and dyspnea   HPI:  73 year old female with HTN, HLD, DM, recently discharged from [facility] SNF after lengthy rehabilitation, recently admitted to ICU with PNA requiring intubation, now presented to [facility] ED by family members after they noted that pt was more lethargic, short of breath with minimal exertion, mostly bed bound with poor oral intake. Please note that pt is rather somnolent in ED and family at bedside provided most of the history. Niece explains that pt was provided oxygen at the SNF and was discharged on it but that did not help with dyspnea. Family also reported fevers as high as 101F, non productive cough, minimal oral intake, minimal urinary output with urine dark and malodorous.   In ED, pt noted to be somnolent but easy to arouse, answers "no" to almost every question but oriented to name and place, year. Chest x-ray shows a focal left lower lobe infiltrate. Oxygen saturation on RA in 80's. TRH asked to admit to SDU for further evaluation.   Assessment and Plan: Active Problems: Acute hypoxic respiratory failure - secondary to HCAP - will admit to SDU - place on empiric ABX Vancomycin and Maxipime - obtain sputum analysis, urine legionella and strep penumo, respiratory viral panel - provide BD's scheduled and as needed, oxygen via  should be adequate for now  Acute on chronic blood loss anemia - no signs of active bleeding - repeat CBC in AM Acute on chronic renal failure, stage II - place on IVF, repeat BMP in AM DM, type II - place on home medical regimen Levemir and also provide SSI  HTN - continue home medical regimen   Lovenox for DVT prophylaxis   Radiological Exams on Admission: Dg Chest 2 View  09/20/2014 There is new left lower lobe pneumonia with small left  pleural effusion. There are stable changes of COPD.     Code Status: Full Family Communication: Family at bedside Disposition Plan: Admit for further evaluation     Review of Systems:  Unable to obtain due to AMS    Past Medical History  Diagnosis Date  . Depression   . Diverticulosis   . Adenomatous colon polyp   . Asthma   . Hypertension   . Mixed hyperlipidemia   . Type II or unspecified type diabetes mellitus without mention of complication, not stated as uncontrolled   . Anemia   . GERD (gastroesophageal reflux disease)   . Vitamin D deficiency   . Glaucoma   . Diverticulitis   . Heart murmur   . Pneumonia   . Cancer     basal cell carcinoma of the skin    Past Surgical History  Procedure Laterality Date  . Abdominal hysterectomy    . Breast biopsy    . Cyst removed from lower spine    . Lithotripsy    . Eye surgery Bilateral 09/2003 Dr. Katy Fitch     Glaucoma    Social History:  reports that she quit smoking about 11 years ago. She has never used smokeless tobacco. She reports that she drinks alcohol. She reports that she does not use illicit drugs.  Allergies  Allergen Reactions  . Codeine Nausea Only and Other (See Comments)    Nightmares  . Ace Inhibitors Rash  . Actonel [Risedronate Sodium] Other (See Comments)    Dyspepsia  . Dilaudid [  Hydromorphone Hcl] Rash  . Hyosol-Sl [Hyoscyamine] Other (See Comments)    Dry mouth  . Metformin And Related Diarrhea  . Phenobarbital Diarrhea  . Quinine Derivatives Rash  . Sulfur Rash  . Tetracyclines & Related Rash and Other (See Comments)    Skin peeled off     Family History  Problem Relation Age of Onset  . Breast cancer Sister   . Diabetes Sister   . Heart disease Sister   . Diabetes Mother   . Hypertension Mother   . Heart disease Mother   . Hyperlipidemia Sister   . Arthritis Brother   . Diabetes Sister   . Esophageal cancer Neg Hx   . Rectal cancer Neg Hx   . Stomach cancer Neg Hx   . Colon  cancer Neg Hx   . Heart disease Father   . Diabetes Brother     Prior to Admission medications   Medication Sig Start Date End Date Taking? Authorizing Provider  albuterol (PROVENTIL HFA;VENTOLIN HFA) 108 (90 BASE) MCG/ACT inhaler Inhale 2 puffs into the lungs 2 (two) times daily as needed for wheezing or shortness of breath.    Yes Historical Provider, MD  albuterol (PROVENTIL) (2.5 MG/3ML) 0.083% nebulizer solution Take 3 mLs (2.5 mg total) by nebulization every 2 (two) hours as needed for wheezing or shortness of breath. 08/26/14  Yes Geradine Girt, DO  alum & mag hydroxide-simeth (MAALOX/MYLANTA) 200-200-20 MG/5ML suspension Take 15 mLs by mouth every 6 (six) hours as needed for indigestion or heartburn. 08/26/14  Yes Geradine Girt, DO  bisoprolol (ZEBETA) 10 MG tablet Take 5 mg by mouth at bedtime.  05/05/14  Yes Unk Pinto, MD  brimonidine (ALPHAGAN) 0.15 % ophthalmic solution Place 1 drop into the right eye 2 (two) times daily at 10 AM and 5 PM.  01/08/14  Yes Historical Provider, MD  Difluprednate (DUREZOL) 0.05 % EMUL Place 1 drop into the left eye daily.    Yes Historical Provider, MD  dorzolamide-timolol (COSOPT) 22.3-6.8 MG/ML ophthalmic solution Place 2 drops into the right eye 2 (two) times daily.  08/19/13  Yes Historical Provider, MD  hydrALAZINE (APRESOLINE) 100 MG tablet Take 100 mg by mouth 3 (three) times daily.   Yes Historical Provider, MD  insulin detemir (LEVEMIR) 100 UNIT/ML injection Inject 10 Units into the skin daily.   Yes Historical Provider, MD  LUMIGAN 0.01 % SOLN Place 1 drop into the right eye 4 (four) times daily.  12/16/13  Yes Historical Provider, MD  moxifloxacin (AVELOX) 400 MG tablet Take 400 mg by mouth daily at 8 pm. For 6 days 09/18/14  Yes Historical Provider, MD  ondansetron (ZOFRAN) 4 MG tablet Take 1 tablet (4 mg total) by mouth every 8 (eight) hours as needed for nausea or vomiting. 08/26/14  Yes Geradine Girt, DO  polyvinyl alcohol (ARTIFICIAL TEARS)  1.4 % ophthalmic solution Place 1 drop into both eyes as needed for dry eyes.   Yes Historical Provider, MD  traMADol (ULTRAM) 50 MG tablet Take 50-100 mg by mouth every 6 (six) hours as needed for moderate pain.   Yes Historical Provider, MD    Physical Exam: Filed Vitals:   09/20/14 1355 09/20/14 1408 09/20/14 1445  BP: 177/52  173/48  Pulse: 83  80  Temp:  97.4 F (36.3 C)   TempSrc:  Rectal   Resp: 18  22  SpO2: 98%  98%    Physical Exam  Constitutional: Appears well-developed and well-nourished. No distress.  HENT: Normocephalic. External right and left ear normal. Dry MM Eyes: Conjunctivae and EOM are normal. PERRLA, no scleral icterus.  Neck: Normal ROM. Neck supple. No JVD. No tracheal deviation. No thyromegaly.  CVS: RRR, S1/S2 +,AEM 3/6, no gallops, no carotid bruit.  Pulmonary: Bilateral rhonchi and diminished air movement at bases  Abdominal: Soft. BS +,  no distension, tenderness, rebound or guarding.  Musculoskeletal: Normal range of motion. +1 bilateral LE pitting edema  Lymphadenopathy: No lymphadenopathy noted, cervical, inguinal. Neuro: Somnolent but easy to arouse, follows commands appropriately but answers no to most questions  Skin: Skin is warm and dry. No rash noted. Not diaphoretic. No erythema. No pallor.  Psychiatric: Difficult to assess due to somnolence   Labs on Admission:  Basic Metabolic Panel:  Recent Labs Lab 09/20/14 1359  NA 132*  K 4.7  CL 90*  CO2 29  GLUCOSE 171*  BUN 36*  CREATININE 1.44*  CALCIUM 9.0   CBC:  Recent Labs Lab 09/20/14 1359  WBC 9.4  NEUTROABS 8.4*  HGB 8.2*  HCT 25.6*  MCV 98.8  PLT 399    EKG: pending   Faye Ramsay, MD  [facility] Pager [phone]  If 7PM-7AM, please contact night-coverage [email or web address] Password [facility] 09/20/2014, 4:56 PM

## 2014-09-20 NOTE — ED Provider Notes (Signed)
Medical screening examination/treatment/procedure(s) were conducted as a shared visit with non-physician practitioner(s) and myself.  I personally evaluated the patient during the encounter  Please see my separate respective documentation pertaining to this patient encounter   Johnna Acosta, MD 09/20/14 [phone]

## 2014-09-20 NOTE — ED Provider Notes (Signed)
CSN: [id number]     Arrival date & time 09/20/14  1342 History   First MD Initiated Contact with Patient 09/20/14 1344     No chief complaint on file.    (Consider location/radiation/quality/duration/timing/severity/associated sxs/prior Treatment) HPI Comments: The patient is a 73 year old female past no history of diabetes, asthma, anemia, recently discharged from the skilled nursing facility 9/22 for acute respiratory failure in the OR with likely aspiration pneumonia, presenting to emergency room in from home with a chief complaint of altered mental status per patient's family since last night. Patient's niece reports the patient had increased confusion last night and this morning. Patient's family reports increase in dyspnea since last night, on 2 L [place].  Patient's family reports cough, denies fever. The patient's family reports malodorous urine today.  The patient denies chest pain, shortness breath, abdominal pain.  The history is provided by the patient and a relative. No language interpreter was used.    Past Medical History  Diagnosis Date  . Depression   . Diverticulosis   . Adenomatous colon polyp   . Asthma   . Hypertension   . Mixed hyperlipidemia   . Type II or unspecified type diabetes mellitus without mention of complication, not stated as uncontrolled   . Anemia   . GERD (gastroesophageal reflux disease)   . Vitamin D deficiency   . Glaucoma   . Diverticulitis   . Heart murmur   . Pneumonia   . Cancer     basal cell carcinoma of the skin   Past Surgical History  Procedure Laterality Date  . Abdominal hysterectomy    . Breast biopsy    . Cyst removed from lower spine    . Lithotripsy    . Eye surgery Bilateral 09/2003 Dr. Katy Fitch     Glaucoma   Family History  Problem Relation Age of Onset  . Breast cancer Sister   . Diabetes Sister   . Heart disease Sister   . Diabetes Mother   . Hypertension Mother   . Heart disease Mother   . Hyperlipidemia Sister   .  Arthritis Brother   . Diabetes Sister   . Esophageal cancer Neg Hx   . Rectal cancer Neg Hx   . Stomach cancer Neg Hx   . Colon cancer Neg Hx   . Heart disease Father   . Diabetes Brother    History  Substance Use Topics  . Smoking status: Former Smoker    Quit date: 12/24/2002  . Smokeless tobacco: Never Used  . Alcohol Use: Yes     Comment: ocassional   OB History   Grav Para Term Preterm Abortions TAB SAB Ect Mult Living                 Review of Systems  Constitutional: Negative for fever and chills.  Respiratory: Positive for cough and shortness of breath.   Gastrointestinal: Negative for nausea, vomiting and diarrhea.  Genitourinary: Positive for dysuria.  Psychiatric/Behavioral: Positive for confusion.      Allergies  Actonel; Codeine; Hyosol-sl; Metformin and related; Phenobarbital; Ace inhibitors; Dilaudid; Quinine derivatives; Sulfur; and Tetracyclines & related  Home Medications   Prior to Admission medications   Medication Sig Start Date End Date Taking? Authorizing Provider  acidophilus (RISAQUAD) CAPS Take 1 capsule by mouth daily.     Historical Provider, MD  albuterol (PROVENTIL HFA;VENTOLIN HFA) 108 (90 BASE) MCG/ACT inhaler Inhale 1 puff into the lungs 2 (two) times daily as needed for wheezing or  shortness of breath.    Historical Provider, MD  albuterol (PROVENTIL) (2.5 MG/3ML) 0.083% nebulizer solution Take 3 mLs (2.5 mg total) by nebulization every 2 (two) hours as needed for wheezing or shortness of breath. 08/26/14   Geradine Girt, DO  alum & mag hydroxide-simeth (MAALOX/MYLANTA) 200-200-20 MG/5ML suspension Take 15 mLs by mouth every 6 (six) hours as needed for indigestion or heartburn. 08/26/14   Geradine Girt, DO  bisoprolol (ZEBETA) 10 MG tablet 5-10 mg at bedtime. Take 1/2 to 1 tablet daily as directed for BP 05/05/14   Unk Pinto, MD  brimonidine [facility]) 0.15 % ophthalmic solution Place 1 drop into the right eye 2 (two) times daily at 10  AM and 5 PM.  01/08/14   Historical Provider, MD  Calcium Carbonate (CALCIUM 600 PO) Take 1 tablet by mouth daily.    Historical Provider, MD  clobetasol (TEMOVATE) 0.05 % external solution Apply 1 application topically 2 (two) times daily.  08/19/13   Historical Provider, MD  Cyanocobalamin (VITAMIN B 12 PO) Take 1,000 mcg by mouth daily.    Historical Provider, MD  Difluprednate (DUREZOL) 0.05 % EMUL Apply 1 drop to eye 2 (two) times daily. 1 ggt to surgical eye BID    Historical Provider, MD  dorzolamide-timolol (COSOPT) 22.3-6.8 MG/ML ophthalmic solution Place 1 drop into the left eye 2 (two) times daily.  08/19/13   Historical Provider, MD  feeding supplement, GLUCERNA SHAKE, (GLUCERNA SHAKE) LIQD Take 237 mLs by mouth 2 (two) times daily between meals. 08/26/14   Geradine Girt, DO  hydrALAZINE (APRESOLINE) 50 MG tablet Take 1 tablet (50 mg total) by mouth every 6 (six) hours. 08/26/14   Geradine Girt, DO  HYDROcodone-acetaminophen (NORCO/VICODIN) 5-325 MG per tablet Take 0.5-1 tablets by mouth every 4 (four) hours as needed for moderate pain. 08/26/14   Geradine Girt, DO  insulin aspart (NOVOLOG) 100 UNIT/ML injection Inject 0-15 Units into the skin 3 (three) times daily with meals. 08/26/14   Geradine Girt, DO  insulin aspart (NOVOLOG) 100 UNIT/ML injection Inject 0-5 Units into the skin at bedtime. 08/26/14   Geradine Girt, DO  insulin detemir (LEVEMIR) 100 UNIT/ML injection Inject 0.08 mLs (8 Units total) into the skin daily. 08/26/14   Geradine Girt, DO  levofloxacin (LEVAQUIN) 750 MG tablet Take 1 tablet (750 mg total) by mouth every other day. 08/26/14   Jessica U Vann, DO  LUMIGAN 0.01 % SOLN Place 1 drop into both eyes at bedtime.  12/16/13   Historical Provider, MD  ondansetron (ZOFRAN) 4 MG tablet Take 1 tablet (4 mg total) by mouth every 8 (eight) hours as needed for nausea or vomiting. 08/26/14   Geradine Girt, DO   There were no vitals taken for this visit. Physical Exam  Nursing note and  vitals reviewed. Constitutional: She is oriented to person, place, and time. She appears well-developed and well-nourished.  Non-toxic appearance.  HENT:  Head: Normocephalic and atraumatic.  Eyes: EOM are normal. Pupils are equal, round, and reactive to light.  Neck: Neck supple.  Cardiovascular: Normal rate and regular rhythm.   Trace pitting edema   Pulmonary/Chest: Tachypnea noted. No respiratory distress. She has decreased breath sounds. She has no wheezes. She has no rales.  Abdominal: Soft. There is no tenderness. There is no rebound and no guarding.  Musculoskeletal:  Right arm in sling.  Neurological: She is alert and oriented to person, place, and time.  Skin: Skin is warm  and dry. She is not diaphoretic.  Psychiatric: She has a normal mood and affect. Her behavior is normal.    ED Course  Procedures (including critical care time) Labs Review Labs Reviewed - No data to display  Results for orders placed during the hospital encounter of 09/20/14  CBC WITH DIFFERENTIAL      Result Value Ref Range   WBC 9.4  4.0 - 10.5 K/uL   RBC 2.59 (*) 3.87 - 5.11 MIL/uL   Hemoglobin 8.2 (*) 12.0 - 15.0 g/dL   HCT 25.6 (*) 36.0 - 46.0 %   MCV 98.8  78.0 - 100.0 fL   MCH 31.7  26.0 - 34.0 pg   MCHC 32.0  30.0 - 36.0 g/dL   RDW 14.3  11.5 - 15.5 %   Platelets 399  150 - 400 K/uL   Neutrophils Relative % 89 (*) 43 - 77 %   Neutro Abs 8.4 (*) 1.7 - 7.7 K/uL   Lymphocytes Relative 3 (*) 12 - 46 %   Lymphs Abs 0.3 (*) 0.7 - 4.0 K/uL   Monocytes Relative 7  3 - 12 %   Monocytes Absolute 0.6  0.1 - 1.0 K/uL   Eosinophils Relative 2  0 - 5 %   Eosinophils Absolute 0.1  0.0 - 0.7 K/uL   Basophils Relative 1  0 - 1 %   Basophils Absolute 0.1  0.0 - 0.1 K/uL  BASIC METABOLIC PANEL      Result Value Ref Range   Sodium 132 (*) 137 - 147 mEq/L   Potassium 4.7  3.7 - 5.3 mEq/L   Chloride 90 (*) 96 - 112 mEq/L   CO2 29  19 - 32 mEq/L   Glucose, Bld 171 (*) 70 - 99 mg/dL   BUN 36 (*) 6 - 23  mg/dL   Creatinine, Ser 1.44 (*) 0.50 - 1.10 mg/dL   Calcium 9.0  8.4 - 10.5 mg/dL   GFR calc non Af Amer 35 (*) >90 mL/min   GFR calc Af Amer 41 (*) >90 mL/min   Anion gap 13  5 - 15  PRO B NATRIURETIC PEPTIDE      Result Value Ref Range   Pro B Natriuretic peptide (BNP) 3105.0 (*) 0 - 125 pg/mL  I-STAT TROPOININ, ED      Result Value Ref Range   Troponin i, poc 0.03  0.00 - 0.08 ng/mL   Comment 3            Dg Chest 2 View  09/20/2014   CLINICAL DATA:  Confusion and lethargy; history of asthma and hypertension and chronic right humeral fracture.  EXAM: CHEST  2 VIEW  COMPARISON:  Portable chest x-ray of August 22, 2014.  FINDINGS: There is new infiltrate at the left lung base with small left pleural effusion layering posteriorly and laterally. The right lung is well-expanded. The interstitial markings of both lungs are increased. The cardiopericardial silhouette is normal in size. The central pulmonary vascularity is prominent. There is chronic deformity of the proximal right humerus.  IMPRESSION: There is new left lower lobe pneumonia with small left pleural effusion. There are stable changes of COPD.   Electronically Signed   By: David  Martinique   On: 09/20/2014 15:25   Imaging Review No results found.   EKG Interpretation   Date/Time:  Monday September 20 2014 14:05:01 EDT Ventricular Rate:  84 PR Interval:  190 QRS Duration: 81 QT Interval:  385 QTC Calculation: 455 R Axis:  35 Text Interpretation:  Sinus rhythm Probable LVH with secondary repol abnrm  Since last tracing ST abnormality less dramatic. Abnormal ekg Confirmed by  Sabra Heck  MD, BRIAN ([id number]) on 09/20/2014 3:26:04 PM      MDM   Final diagnoses:  HCAP (healthcare-associated pneumonia)  Pleural effusion  UTI (lower urinary tract infection)   Patient with increased confusion since yesterday, recently discharged from [facility] 9/22 increase with work of breathing, cough, malodorous urine per patient's family. On exam  decreased lung fields throughout, with mild tachypnea, pulse ox 99% on 2 L nasal cannula. X-ray and labs ordered. Excellent x-ray shows left lower lobe pneumonia with small pleural effusion. BNP 3105 down from 4 weeks ago. Will treat for HCAP. Dr. Sabra Heck also patient during this counter, recommend Lactate and ABG.  Plan to admit. 4:38 PM Discussed pt history condition with Dr. Doyle Askew who agrees to admit the patient. Meds given in ED:  Medications  ceFEPIme (MAXIPIME) 1 g in dextrose 5 % 50 mL IVPB (1 g Intravenous New Bag/Given 09/20/14 1611)  vancomycin (VANCOCIN) IVPB 750 mg/150 ml premix (not administered)    New Prescriptions   No medications on file     Harvie Heck, PA-C 09/20/14 1642

## 2014-09-20 NOTE — ED Notes (Signed)
Per family, patient with confusion and lethargy this am, patient alert and oriented x 4 upon arrival, EMS reports RA sats at mid 80's, O2 2 lpm bringing sats to mid 90's

## 2014-09-21 LAB — STREP PNEUMONIAE URINARY ANTIGEN: STREP PNEUMO URINARY ANTIGEN: NEGATIVE

## 2014-09-21 LAB — GLUCOSE, CAPILLARY
GLUCOSE-CAPILLARY: 151 mg/dL — AB (ref 70–99)
GLUCOSE-CAPILLARY: 171 mg/dL — AB (ref 70–99)
Glucose-Capillary: 71 mg/dL (ref 70–99)
Glucose-Capillary: 139 mg/dL — ABNORMAL HIGH (ref 70–99)
Glucose-Capillary: 53 mg/dL — ABNORMAL LOW (ref 70–99)
Glucose-Capillary: 110 mg/dL — ABNORMAL HIGH (ref 70–99)

## 2014-09-21 LAB — BLOOD GAS, ARTERIAL
Acid-Base Excess: 1.3 mmol/L (ref 0.0–2.0)
Bicarbonate: 28.6 mEq/L — ABNORMAL HIGH (ref 20.0–24.0)
Drawn by: 41881
O2 Content: 2 L/min
O2 SAT: 99 %
PATIENT TEMPERATURE: 98.6
TCO2: 30.9 mmol/L (ref 0–100)
pCO2 arterial: 76 mmHg (ref 35.0–45.0)
pH, Arterial: 7.2 — ABNORMAL LOW (ref 7.350–7.450)
pO2, Arterial: 122 mmHg — ABNORMAL HIGH (ref 80.0–100.0)

## 2014-09-21 LAB — LEGIONELLA ANTIGEN, URINE

## 2014-09-21 LAB — CBC
HEMATOCRIT: 26.1 % — AB (ref 36.0–46.0)
Hemoglobin: 8.1 g/dL — ABNORMAL LOW (ref 12.0–15.0)
MCH: 31.2 pg (ref 26.0–34.0)
MCHC: 31 g/dL (ref 30.0–36.0)
MCV: 100.4 fL — AB (ref 78.0–100.0)
Platelets: 410 10*3/uL — ABNORMAL HIGH (ref 150–400)
RBC: 2.6 MIL/uL — ABNORMAL LOW (ref 3.87–5.11)
RDW: 14.4 % (ref 11.5–15.5)
WBC: 9.3 10*3/uL (ref 4.0–10.5)

## 2014-09-21 LAB — BASIC METABOLIC PANEL
ANION GAP: 15 (ref 5–15)
BUN: 42 mg/dL — AB (ref 6–23)
CALCIUM: 8.7 mg/dL (ref 8.4–10.5)
CO2: 27 meq/L (ref 19–32)
CREATININE: 1.55 mg/dL — AB (ref 0.50–1.10)
Chloride: 90 mEq/L — ABNORMAL LOW (ref 96–112)
GFR calc Af Amer: 37 mL/min — ABNORMAL LOW (ref >90)
GFR calc non Af Amer: 32 mL/min — ABNORMAL LOW (ref >90)
GLUCOSE: 163 mg/dL — AB (ref 70–99)
Potassium: 4.7 mEq/L (ref 3.7–5.3)
Sodium: 132 mEq/L — ABNORMAL LOW (ref 137–147)

## 2014-09-21 MED ORDER — INFLUENZA VAC SPLIT QUAD 0.5 ML IM SUSY
0.5000 mL | PREFILLED_SYRINGE | INTRAMUSCULAR | Status: DC
  Filled 2014-09-21: qty 0.5

## 2014-09-21 MED ORDER — ENOXAPARIN SODIUM 30 MG/0.3ML ~~LOC~~ SOLN
30.0000 mg | Freq: Every day | SUBCUTANEOUS | Status: DC
  Administered 2014-09-21 – 2014-09-23 (×3): 30 mg via SUBCUTANEOUS
  Filled 2014-09-21 (×4): qty 0.3

## 2014-09-21 MED ORDER — BRIMONIDINE TARTRATE 0.2 % OP SOLN
1.0000 [drp] | Freq: Two times a day (BID) | OPHTHALMIC | Status: DC
  Administered 2014-09-21 – 2014-09-24 (×7): 1 [drp] via OPHTHALMIC
  Filled 2014-09-21: qty 5

## 2014-09-21 MED ORDER — CETYLPYRIDINIUM CHLORIDE 0.05 % MT LIQD
7.0000 mL | Freq: Two times a day (BID) | OROMUCOSAL | Status: DC
  Administered 2014-09-21 (×2): 7 mL via OROMUCOSAL

## 2014-09-21 MED ORDER — DEXTROSE 50 % IV SOLN
25.0000 mL | Freq: Once | INTRAVENOUS | Status: AC | PRN
  Administered 2014-09-21: 25 mL via INTRAVENOUS

## 2014-09-21 MED ORDER — PROMETHAZINE HCL 25 MG/ML IJ SOLN
12.5000 mg | Freq: Four times a day (QID) | INTRAMUSCULAR | Status: DC | PRN
  Administered 2014-09-21 – 2014-09-24 (×2): 12.5 mg via INTRAVENOUS
  Filled 2014-09-21 (×3): qty 1

## 2014-09-21 MED ORDER — DEXTROSE 50 % IV SOLN
INTRAVENOUS | Status: AC
  Filled 2014-09-21: qty 50

## 2014-09-21 NOTE — Progress Notes (Signed)
Utilization review completed.  

## 2014-09-21 NOTE — Clinical Social Work Psychosocial (Signed)
Clinical Social Work Department BRIEF PSYCHOSOCIAL ASSESSMENT 09/21/2014  Patient:  Brandy Short, Brandy Short     Account Number:  [id number]     Admit date:  09/20/2014  Clinical Social Worker:  Marciano Sequin  Date/Time:  09/21/2014 12:10 PM  Referred by:  RN  Date Referred:  09/21/2014 Referred for  SNF Placement   Other Referral:   Interview type:  Family Other interview type:   Pt is not oriented; Brandy Short, nephew [phone]    PSYCHOSOCIAL DATA Living Status:  ALONE Admitted from facility:  [facility] Level of care:  [place] Primary support name:  Brandy Short, nephew [phone] Primary support relationship to patient:  FAMILY Degree of support available:   strong    CURRENT CONCERNS  Other Concerns:    SOCIAL WORK ASSESSMENT / PLAN CSW called the pt's newphew Scott. CSW introduce self and purpose of call. Scott explained recents event with the pt's health. Nicki Reaper reported the family has decided to move towards long-term care for the pt. Scott reported the pt's condition has not improved. Scott reported the pt was at [facility] for several weeks receiving rehab. Scott reported the family has decided to place the pt at another SNF. Nicki Reaper reported the family is intrested in [facility]. Scott reported the family has been working with Ms. Alexis to arrange long-term care for the pt.  CSW explained the SNF process to the [place]. CSW provided family with contact information for further questions. CSW will contiune to assist and aid in discharge.   Assessment/plan status:  Information/Referral to [employer] Other assessment/ plan:   seeking another SNF for the pt.   Information/referral to community resources:   SNF list    PATIENT'S/FAMILY'S RESPONSE TO PLAN OF CARE: agreed   Greta Doom, MSW, [place]

## 2014-09-21 NOTE — Progress Notes (Signed)
Pt unable to void since 0700. MD [facility] aware, pt assisted on to bedpan several times, and still unable to void. Pt bladder scan volume 349ml. Order received to place a foley due to critical/unstable status. Pericare performed before placement. Foley placed using sterile technique with RN and NT in the room. Pt tolerated well and pericare done post-placement. Will continue to evaluate need for foley.

## 2014-09-21 NOTE — Progress Notes (Signed)
Note: This document was prepared with digital dictation and possible smart phrase technology. Any transcriptional errors that result from this process are unintentional.   Brandy Short XHB:[id number] DOB: 10/26/1941 DOA: 09/20/2014 PCP: Alesia Richards, MD  Brief narrative: 73 y/o ?, h/o hiatal hernia with [name] lesions/mild nonobstructive strictures, recent admit 8/27-9/3 for possible CAP + encephalopathy and Humeral #, complicated hospitalization by acute respiratory failure, ST-T wave changes left heart cath showed clean coronaries readmitted to hospital 9/28 with decreased oral intake decreased level of consciousness and metabolic encephalopathy lab workup showed stage I kidney disease, BNP 3000, left 0.8, 0.2 troponin 0.23, no white count, chest x-ray showed new left lower lobe pneumonia small pleural effusion and changes of COPD  Past medical history-As per Problem list Chart reviewed as below-  review   Consultants:   none   Procedures:   chest x-ray   Antibiotics:  cefepime 9/28    vancomycin 9/28    blood cultures and urine cultures pending 9/28   Subjective   Sleepy but arousable Can tell me the city and year but cannot tell me month or the date and seems a little confused Review of systems is unreliable   Objective    Interim History: Reviewed  Telemetry: Sinus   Objective: Filed Vitals:   09/21/14 0405 09/21/14 0725 09/21/14 0739 09/21/14 0802  BP: 115/42   116/40  Pulse: 69   71  Temp: 94.1 F (34.5 C) 94.7 F (34.8 C)  95.3 F (35.2 C)  TempSrc: Rectal Rectal  Rectal  Resp: 18   17  Height:      Weight:      SpO2: 98%  100% 98%    Intake/Output Summary (Last 24 hours) at 09/21/14 0910 Last data filed at 09/21/14 0800  Gross per 24 hour  Intake    290 ml  Output      0 ml  Net    290 ml    Exam:  General: EOMI NCAT PERRLA Cardiovascular: S1-S2 regular rate rhythm Respiratory: Clinically clear no added sounds Abdomen:  Soft nontender no rebound no guarding Skin grade 2 lower extremity edema Neuro somnolent, but arousable. 5 over 5 power, follows commands  Data Reviewed: Basic Metabolic Panel:  Recent Labs Lab 09/20/14 1359 09/21/14 0327  NA 132* 132*  K 4.7 4.7  CL 90* 90*  CO2 29 27  GLUCOSE 171* 163*  BUN 36* 42*  CREATININE 1.44* 1.55*  CALCIUM 9.0 8.7   Liver Function Tests: No results found for this basename: AST, ALT, ALKPHOS, BILITOT, PROT, ALBUMIN,  in the last 168 hours No results found for this basename: LIPASE, AMYLASE,  in the last 168 hours No results found for this basename: AMMONIA,  in the last 168 hours CBC:  Recent Labs Lab 09/20/14 1359 09/21/14 0327  WBC 9.4 9.3  NEUTROABS 8.4*  --   HGB 8.2* 8.1*  HCT 25.6* 26.1*  MCV 98.8 100.4*  PLT 399 410*   Cardiac Enzymes: No results found for this basename: CKTOTAL, CKMB, CKMBINDEX, TROPONINI,  in the last 168 hours BNP: No components found with this basename: POCBNP,  CBG:  Recent Labs Lab 09/20/14 2342 09/21/14 0802  GLUCAP 171* 151*    No results found for this or any previous visit (from the past 240 hour(s)).   Studies:              All Imaging reviewed and is as per above notation   Scheduled Meds: . albuterol  2.5 mg Nebulization QID  . antiseptic oral rinse  7 mL Mouth Rinse BID  . bisoprolol  5 mg Oral QHS  . brimonidine  1 drop Right Eye BID  . ceFEPime (MAXIPIME) IV  1 g Intravenous Q24H  . dextromethorphan-guaiFENesin  1 tablet Oral BID  . dorzolamide-timolol  2 drop Right Eye BID  . enoxaparin (LOVENOX) injection  40 mg Subcutaneous Q24H  . hydrALAZINE  100 mg Oral 3 times per day  . [START ON 09/22/2014] Influenza vac split quadrivalent PF  0.5 mL Intramuscular Tomorrow-1000  . insulin aspart  0-9 Units Subcutaneous TID WC  . insulin detemir  10 Units Subcutaneous QHS  . latanoprost  1 drop Both Eyes QHS  . vancomycin  750 mg Intravenous Q24H   Continuous Infusions:     Assessment/Plan: 1. Severe sepsis with hypothermia secondary to Healthcare associated pneumonia-continue vancomycin cefepime. Trend lactic acid, get a pro calcitonin.  Her blood pressures have dropped a little and we will discontinue her scheduled hydralazine 100 3 times a day, bisoprolol 5 each bedtime. We'll monitor her closely onset angina. She is a full code. Follow blood and urine cultures. in addition a week legionella antigen, strep pneumo was negative.  She tells me she has had issues eating a diet in the past and gets choked.  We will monitor this and get SLP if needed 2. Toxic metabolic encephalopathy secondary to sepsis 3. Elevated proBNP-likely secondary to sepsis. Echocardiogram 8/27 negative as was cardiac cath last admission 4. Stage I chronic kidney disease-Aim is to keep volume even.  Would not diuresis or give fluids at this time 5. History of seizures erosions and hiatal hernia continue PPI 6. Diabetes mellitus type 2 insulin-dependent-continue Levemir 10 units each bedtime as well as sliding scale coverage 7. Possible COPD-no wheeze currently. Monitor  Code Status: Full Family Communication: discussed c sister on ? # fr HCPOA Disposition Plan: SDU   Verneita Griffes, MD  [facility] Pager [phone] 09/21/2014, 9:10 AM    LOS: 1 day

## 2014-09-21 NOTE — Progress Notes (Signed)
CRITICAL VALUE ALERT  Critical value received:  ABG results: pH 7.2 CO2 76 O2 122 bicarb 28.6  Date of notification:  09/21/14  Time of notification:  2300  Critical value read back:Yes.    Nurse who received alert:  D. Danise Edge  MD notified (1st page):  Tylene Fantasia, NP  Time of first page:  2302  MD notified (2nd page):  Time of second page:  Responding MD:  Tylene Fantasia, NP  Time MD responded:  0015  New orders received. Call RT to place pt on Bipap.

## 2014-09-22 ENCOUNTER — Inpatient Hospital Stay (HOSPITAL_COMMUNITY): Payer: Medicare Other

## 2014-09-22 LAB — BLOOD GAS, ARTERIAL
ACID-BASE DEFICIT: 0.8 mmol/L (ref 0.0–2.0)
ACID-BASE EXCESS: 0.5 mmol/L (ref 0.0–2.0)
Acid-Base Excess: 1.5 mmol/L (ref 0.0–2.0)
BICARBONATE: 26.2 meq/L — AB (ref 20.0–24.0)
BICARBONATE: 25.1 meq/L — AB (ref 20.0–24.0)
Bicarbonate: 28.5 mEq/L — ABNORMAL HIGH (ref 20.0–24.0)
DRAWN BY: 35135
DRAWN BY: 27022
Delivery systems: POSITIVE
Delivery systems: POSITIVE
Delivery systems: POSITIVE
Drawn by: 27022
EXPIRATORY PAP: 7
Expiratory PAP: 6
FIO2: 0.3 %
FIO2: 0.3 %
FIO2: 0.3 %
INSPIRATORY PAP: 12
Inspiratory PAP: 14
Mode: POSITIVE
O2 SAT: 97.2 %
O2 SAT: 98.2 %
O2 Saturation: 97.3 %
PO2 ART: 100 mmHg (ref 80.0–100.0)
Patient temperature: 98.6
Patient temperature: 98.6
Patient temperature: 98.6
TCO2: 30.7 mmol/L (ref 0–100)
TCO2: 27.9 mmol/L (ref 0–100)
TCO2: 26.8 mmol/L (ref 0–100)
pCO2 arterial: 71.9 mmHg (ref 35.0–45.0)
pCO2 arterial: 55 mmHg — ABNORMAL HIGH (ref 35.0–45.0)
pCO2 arterial: 54.9 mmHg — ABNORMAL HIGH (ref 35.0–45.0)
pH, Arterial: 7.222 — ABNORMAL LOW (ref 7.350–7.450)
pH, Arterial: 7.299 — ABNORMAL LOW (ref 7.350–7.450)
pH, Arterial: 7.281 — ABNORMAL LOW (ref 7.350–7.450)
pO2, Arterial: 93.9 mmHg (ref 80.0–100.0)
pO2, Arterial: 104 mmHg — ABNORMAL HIGH (ref 80.0–100.0)

## 2014-09-22 LAB — COMPREHENSIVE METABOLIC PANEL
ALK PHOS: 107 U/L (ref 39–117)
ALT: 16 U/L (ref 0–35)
ALT: 16 U/L (ref 0–35)
ANION GAP: 10 (ref 5–15)
AST: 19 U/L (ref 0–37)
AST: 21 U/L (ref 0–37)
Albumin: 2.5 g/dL — ABNORMAL LOW (ref 3.5–5.2)
Albumin: 2.5 g/dL — ABNORMAL LOW (ref 3.5–5.2)
Alkaline Phosphatase: 106 U/L (ref 39–117)
Anion gap: 14 (ref 5–15)
BILIRUBIN TOTAL: 0.2 mg/dL — AB (ref 0.3–1.2)
BILIRUBIN TOTAL: 0.2 mg/dL — AB (ref 0.3–1.2)
BUN: 50 mg/dL — AB (ref 6–23)
BUN: 49 mg/dL — AB (ref 6–23)
CALCIUM: 8.5 mg/dL (ref 8.4–10.5)
CHLORIDE: 93 meq/L — AB (ref 96–112)
CO2: 27 meq/L (ref 19–32)
CO2: 30 mEq/L (ref 19–32)
CREATININE: 1.96 mg/dL — AB (ref 0.50–1.10)
CREATININE: 1.97 mg/dL — AB (ref 0.50–1.10)
Calcium: 8.4 mg/dL (ref 8.4–10.5)
Chloride: 93 mEq/L — ABNORMAL LOW (ref 96–112)
GFR calc Af Amer: 28 mL/min — ABNORMAL LOW (ref >90)
GFR calc Af Amer: 28 mL/min — ABNORMAL LOW (ref >90)
GFR calc non Af Amer: 24 mL/min — ABNORMAL LOW (ref >90)
GFR, EST NON AFRICAN AMERICAN: 24 mL/min — AB (ref >90)
GLUCOSE: 101 mg/dL — AB (ref 70–99)
Glucose, Bld: 97 mg/dL (ref 70–99)
POTASSIUM: 4.9 meq/L (ref 3.7–5.3)
Potassium: 5 mEq/L (ref 3.7–5.3)
Sodium: 133 mEq/L — ABNORMAL LOW (ref 137–147)
Sodium: 134 mEq/L — ABNORMAL LOW (ref 137–147)
Total Protein: 6.1 g/dL (ref 6.0–8.3)
Total Protein: 6.4 g/dL (ref 6.0–8.3)

## 2014-09-22 LAB — CBC WITH DIFFERENTIAL/PLATELET
BASOS ABS: 0 10*3/uL (ref 0.0–0.1)
Basophils Relative: 0 % (ref 0–1)
EOS PCT: 0 % (ref 0–5)
Eosinophils Absolute: 0 10*3/uL (ref 0.0–0.7)
HEMATOCRIT: 24.5 % — AB (ref 36.0–46.0)
HEMOGLOBIN: 7.7 g/dL — AB (ref 12.0–15.0)
LYMPHS PCT: 3 % — AB (ref 12–46)
Lymphs Abs: 0.4 10*3/uL — ABNORMAL LOW (ref 0.7–4.0)
MCH: 32.2 pg (ref 26.0–34.0)
MCHC: 31.4 g/dL (ref 30.0–36.0)
MCV: 102.5 fL — AB (ref 78.0–100.0)
MONO ABS: 0.8 10*3/uL (ref 0.1–1.0)
MONOS PCT: 6 % (ref 3–12)
Neutro Abs: 12 10*3/uL — ABNORMAL HIGH (ref 1.7–7.7)
Neutrophils Relative %: 91 % — ABNORMAL HIGH (ref 43–77)
Platelets: 396 10*3/uL (ref 150–400)
RBC: 2.39 MIL/uL — ABNORMAL LOW (ref 3.87–5.11)
RDW: 14.7 % (ref 11.5–15.5)
WBC: 13.3 10*3/uL — ABNORMAL HIGH (ref 4.0–10.5)

## 2014-09-22 LAB — URINALYSIS, ROUTINE W REFLEX MICROSCOPIC
Bilirubin Urine: NEGATIVE
GLUCOSE, UA: NEGATIVE mg/dL
HGB URINE DIPSTICK: NEGATIVE
Ketones, ur: 15 mg/dL — AB
Nitrite: NEGATIVE
Protein, ur: 100 mg/dL — AB
SPECIFIC GRAVITY, URINE: 1.019 (ref 1.005–1.030)
Urobilinogen, UA: 0.2 mg/dL (ref 0.0–1.0)
pH: 5 (ref 5.0–8.0)

## 2014-09-22 LAB — GLUCOSE, CAPILLARY
GLUCOSE-CAPILLARY: 140 mg/dL — AB (ref 70–99)
Glucose-Capillary: 104 mg/dL — ABNORMAL HIGH (ref 70–99)
Glucose-Capillary: 106 mg/dL — ABNORMAL HIGH (ref 70–99)
Glucose-Capillary: 111 mg/dL — ABNORMAL HIGH (ref 70–99)
Glucose-Capillary: 113 mg/dL — ABNORMAL HIGH (ref 70–99)
Glucose-Capillary: 129 mg/dL — ABNORMAL HIGH (ref 70–99)
Glucose-Capillary: 168 mg/dL — ABNORMAL HIGH (ref 70–99)

## 2014-09-22 LAB — URINE MICROSCOPIC-ADD ON

## 2014-09-22 LAB — LACTIC ACID, PLASMA: Lactic Acid, Venous: 1.1 mmol/L (ref 0.5–2.2)

## 2014-09-22 MED ORDER — INSULIN ASPART 100 UNIT/ML ~~LOC~~ SOLN
0.0000 [IU] | SUBCUTANEOUS | Status: DC
  Administered 2014-09-22: 2 [IU] via SUBCUTANEOUS
  Administered 2014-09-23: 1 [IU] via SUBCUTANEOUS
  Administered 2014-09-23: 2 [IU] via SUBCUTANEOUS

## 2014-09-22 MED ORDER — CHLORHEXIDINE GLUCONATE 0.12 % MT SOLN
15.0000 mL | Freq: Two times a day (BID) | OROMUCOSAL | Status: DC
  Administered 2014-09-22 – 2014-09-24 (×5): 15 mL via OROMUCOSAL
  Filled 2014-09-22 (×7): qty 15

## 2014-09-22 MED ORDER — CETYLPYRIDINIUM CHLORIDE 0.05 % MT LIQD
7.0000 mL | Freq: Two times a day (BID) | OROMUCOSAL | Status: DC
  Administered 2014-09-22 – 2014-09-24 (×6): 7 mL via OROMUCOSAL

## 2014-09-22 MED ORDER — SODIUM CHLORIDE 0.9 % IV BOLUS (SEPSIS)
250.0000 mL | Freq: Once | INTRAVENOUS | Status: AC
  Administered 2014-09-22: 08:00:00 via INTRAVENOUS

## 2014-09-22 NOTE — Significant Event (Signed)
abg not much better than this am Patient more responsive however Family interested in Palliative medicine discussion Want to see response overnight to 24 hours bipap Rpt abg am reassess and plan to discuss further in am  Verneita Griffes, MD [facility] [phone]

## 2014-09-22 NOTE — Progress Notes (Signed)
Note: This document was prepared with digital dictation and possible smart phrase technology. Any transcriptional errors that result from this process are unintentional.   Brandy Short ZSM:[id number] DOB: 1941/05/13 DOA: 09/20/2014 PCP: Alesia Richards, MD  Brief narrative: 73 y/o ?, h/o hiatal hernia with [facility] lesions/mild nonobstructive strictures, recent admit 8/27-9/3 for possible CAP + encephalopathy and Humeral #, complicated hospitalization by acute respiratory failure, ST-T wave changes left heart cath showed clean coronaries readmitted to hospital 9/28 with decreased oral intake decreased level of consciousness and metabolic encephalopathy lab workup showed stage I kidney disease, BNP 3000, left 0.8, 0.2 troponin 0.23, no white count, chest x-ray showed new left lower lobe pneumonia small pleural effusion and changes of COPD  Past medical history-As per Problem list Chart reviewed as below-  review   Consultants:   none   Procedures:   chest x-ray   Antibiotics:  cefepime 9/28    vancomycin 9/28    blood cultures Pending  urine cultures-Gram neg rods 9/28   Subjective   Overnight events noted. Patient much less responsive than previously. No family at bedside. Cannot obtain ROS     Objective    Interim History: RN reports a decrease in urine output and no improvement on BiPAP  Telemetry: Sinus   Objective: Filed Vitals:   09/21/14 2329 09/22/14 0000 09/22/14 0321 09/22/14 0619  BP: 126/36 107/63 115/40   Pulse: 69 63 69   Temp: 98 F (36.7 C)  98 F (36.7 C)   TempSrc: Rectal  Oral   Resp: 29 15 24    Height:      Weight:    60.8 kg (134 lb 0.6 oz)  SpO2: 98% 99% 99%     Intake/Output Summary (Last 24 hours) at 09/22/14 0723 Last data filed at 09/22/14 0353  Gross per 24 hour  Intake    250 ml  Output    475 ml  Net   -225 ml    Exam:  General: EOMI NCAT PERRLA Cardiovascular: S1-S2 regular rate rhythm, long holosystolic  murmur left lower sternal edge and right lower sternal edge Respiratory: Clinically clear no added sounds Abdomen: Soft nontender no rebound no guarding Skin edema resolved Neuro somnolent, not arousable  Data Reviewed: Basic Metabolic Panel:  Recent Labs Lab 09/20/14 1359 09/21/14 0327 09/21/14 2349 09/22/14 0240  NA 132* 132* 133* 134*  K 4.7 4.7 4.9 5.0  CL 90* 90* 93* 93*  CO2 29 27 30 27   GLUCOSE 171* 163* 101* 97  BUN 36* 42* 49* 50*  CREATININE 1.44* 1.55* 1.97* 1.96*  CALCIUM 9.0 8.7 8.4 8.5   Liver Function Tests:  Recent Labs Lab 09/21/14 2349 09/22/14 0240  AST 21 19  ALT 16 16  ALKPHOS 107 106  BILITOT 0.2* 0.2*  PROT 6.4 6.1  ALBUMIN 2.5* 2.5*   No results found for this basename: LIPASE, AMYLASE,  in the last 168 hours No results found for this basename: AMMONIA,  in the last 168 hours CBC:  Recent Labs Lab 09/20/14 1359 09/21/14 0327 09/22/14 0240  WBC 9.4 9.3 13.3*  NEUTROABS 8.4*  --  12.0*  HGB 8.2* 8.1* 7.7*  HCT 25.6* 26.1* 24.5*  MCV 98.8 100.4* 102.5*  PLT 399 410* 396   Cardiac Enzymes: No results found for this basename: CKTOTAL, CKMB, CKMBINDEX, TROPONINI,  in the last 168 hours BNP: No components found with this basename: POCBNP,  CBG:  Recent Labs Lab 09/21/14 1738 09/21/14 1809 09/21/14 2135 09/22/14 09/22/14  [facility]    Recent Results (from the past 240 hour(s))  URINE CULTURE     Status: None   Collection Time    09/20/14  3:22 PM      Result Value Ref Range Status   Specimen Description URINE, CATHETERIZED   Final   Special Requests ADDED [id number]   Final   Culture  Setup Time     Final   Value: 09/20/2014 23:39     Performed at [facility]     Final   Value: >=100,000 COLONIES/ML     Performed at [facility]   Culture     Final   Value: GRAM NEGATIVE RODS     Performed at [facility]   Report Status PENDING   Incomplete      Studies:              All Imaging reviewed and is as per above notation   Scheduled Meds: . antiseptic oral rinse  7 mL Mouth Rinse q12n4p  . brimonidine  1 drop Right Eye BID  . ceFEPime (MAXIPIME) IV  1 g Intravenous Q24H  . chlorhexidine  15 mL Mouth Rinse BID  . dextromethorphan-guaiFENesin  1 tablet Oral BID  . dorzolamide-timolol  2 drop Right Eye BID  . enoxaparin (LOVENOX) injection  30 mg Subcutaneous QHS  . Influenza vac split quadrivalent PF  0.5 mL Intramuscular Tomorrow-1000  . insulin aspart  0-9 Units Subcutaneous TID WC  . latanoprost  1 drop Both Eyes QHS  . vancomycin  750 mg Intravenous Q24H   Continuous Infusions:    Assessment/Plan: 1. Severe sepsis with hypothermia secondary to Healthcare associated pneumonia + pyelonephritis secondary to gram-negative rods-continue vancomycin cefepime.  lactic acid 1.1 this a.m., strep pneumo was negative.  Prognosis is poor if doesnt recover and may need intubation.  Will discuss with family. 2. Hypercarbic respiratory failure-rpt ABG this am.  Rpt cxr shows no significant change from prior cxr 3. Toxic metabolic encephalopathy secondary to sepsis and #2-hopefully will recover 4. Elevated proBNP-likely secondary to sepsis. Echocardiogram 8/27 negative as was cardiac cath last admission 5.  acute/chronic worsening Kidney disease-hold Lasix, give a 250 cc bolus IV fluids this a.m. As Uop dropping 6. History of seizures erosions and hiatal hernia continue PPI 7. Diabetes mellitus type 2 insulin-dependent-continue Levemir 10 units each bedtime as well as sliding scale coverage 8. Possible COPD-no wheeze currently. Monitor  Code Status: Full Family Communication: discussed c sister who is aware and will come to besdide Disposition Plan: SDU    Performed by: Nita Sells   Total critical care time: 53  Critical care time was exclusive of separately billable procedures and treating other patients.  Critical care  was necessary to treat or prevent imminent or life-threatening deterioration.  Critical care was time spent personally by me on the following activities: development of treatment plan with patient and/or surrogate as well as nursing, discussions with consultants, evaluation of patient's response to treatment, examination of patient, obtaining history from patient or surrogate, ordering and performing treatments and interventions, ordering and review of laboratory studies, ordering and review of radiographic studies, pulse oximetry and re-evaluation of patient's condition.   Verneita Griffes, MD  [facility] Pager [phone] 09/22/2014, 7:23 AM    LOS: 2 days

## 2014-09-22 NOTE — Progress Notes (Addendum)
Shift event: RN paged this NP earlier stating pt was very lethargic and not responding like she had been earlier today. ABG ordered which showed high PCO2. Bipap placed. ABG repeated after Bipap on for about 2 hours. Mild improvement in PCO2. pH remains 7.2. Lab showed slightly worsening creatinine. Pt will now open eyes to name calling but is not conversant. Will continue Bipap throughout night and recheck ABG in am. Attempted to call family without success.  Addendum: Lactate climbing. WBCC up. Ordered r/p UA and CXR. No change in mental status.

## 2014-09-23 DIAGNOSIS — Z515 Encounter for palliative care: Secondary | ICD-10-CM

## 2014-09-23 DIAGNOSIS — J189 Pneumonia, unspecified organism: Secondary | ICD-10-CM

## 2014-09-23 DIAGNOSIS — J439 Emphysema, unspecified: Secondary | ICD-10-CM

## 2014-09-23 DIAGNOSIS — J438 Other emphysema: Secondary | ICD-10-CM

## 2014-09-23 LAB — BLOOD GAS, ARTERIAL
Acid-base deficit: 0.3 mmol/L (ref 0.0–2.0)
BICARBONATE: 25.1 meq/L — AB (ref 20.0–24.0)
DELIVERY SYSTEMS: POSITIVE
DRAWN BY: 347621
FIO2: 0.3 %
O2 SAT: 97.4 %
PATIENT TEMPERATURE: 98.2
PEEP/CPAP: 7 cmH2O
PH ART: 7.321 — AB (ref 7.350–7.450)
PRESSURE CONTROL: 7 cmH2O
RATE: 15 resp/min
TCO2: 26.6 mmol/L (ref 0–100)
pCO2 arterial: 49.8 mmHg — ABNORMAL HIGH (ref 35.0–45.0)
pO2, Arterial: 105 mmHg — ABNORMAL HIGH (ref 80.0–100.0)

## 2014-09-23 LAB — COMPREHENSIVE METABOLIC PANEL
ALT: 13 U/L (ref 0–35)
AST: 16 U/L (ref 0–37)
Albumin: 2.3 g/dL — ABNORMAL LOW (ref 3.5–5.2)
Alkaline Phosphatase: 95 U/L (ref 39–117)
Anion gap: 13 (ref 5–15)
BUN: 61 mg/dL — ABNORMAL HIGH (ref 6–23)
CALCIUM: 8.2 mg/dL — AB (ref 8.4–10.5)
CO2: 27 mEq/L (ref 19–32)
Chloride: 96 mEq/L (ref 96–112)
Creatinine, Ser: 2.13 mg/dL — ABNORMAL HIGH (ref 0.50–1.10)
GFR calc Af Amer: 25 mL/min — ABNORMAL LOW (ref >90)
GFR calc non Af Amer: 22 mL/min — ABNORMAL LOW (ref >90)
Glucose, Bld: 120 mg/dL — ABNORMAL HIGH (ref 70–99)
Potassium: 4.9 mEq/L (ref 3.7–5.3)
SODIUM: 136 meq/L — AB (ref 137–147)
TOTAL PROTEIN: 5.7 g/dL — AB (ref 6.0–8.3)
Total Bilirubin: 0.3 mg/dL (ref 0.3–1.2)

## 2014-09-23 LAB — GLUCOSE, CAPILLARY
Glucose-Capillary: 122 mg/dL — ABNORMAL HIGH (ref 70–99)
Glucose-Capillary: 109 mg/dL — ABNORMAL HIGH (ref 70–99)
Glucose-Capillary: 157 mg/dL — ABNORMAL HIGH (ref 70–99)

## 2014-09-23 LAB — CBC WITH DIFFERENTIAL/PLATELET
BASOS PCT: 0 % (ref 0–1)
Basophils Absolute: 0 10*3/uL (ref 0.0–0.1)
EOS ABS: 0 10*3/uL (ref 0.0–0.7)
Eosinophils Relative: 0 % (ref 0–5)
HCT: 23.5 % — ABNORMAL LOW (ref 36.0–46.0)
HEMOGLOBIN: 7.6 g/dL — AB (ref 12.0–15.0)
LYMPHS ABS: 0.6 10*3/uL — AB (ref 0.7–4.0)
Lymphocytes Relative: 5 % — ABNORMAL LOW (ref 12–46)
MCH: 32.6 pg (ref 26.0–34.0)
MCHC: 32.3 g/dL (ref 30.0–36.0)
MCV: 100.9 fL — ABNORMAL HIGH (ref 78.0–100.0)
MONO ABS: 1.2 10*3/uL — AB (ref 0.1–1.0)
MONOS PCT: 10 % (ref 3–12)
NEUTROS ABS: 10.3 10*3/uL — AB (ref 1.7–7.7)
NEUTROS PCT: 85 % — AB (ref 43–77)
Platelets: 356 10*3/uL (ref 150–400)
RBC: 2.33 MIL/uL — AB (ref 3.87–5.11)
RDW: 15.1 % (ref 11.5–15.5)
WBC: 12.1 10*3/uL — ABNORMAL HIGH (ref 4.0–10.5)

## 2014-09-23 LAB — URINE CULTURE: Colony Count: >=100000

## 2014-09-23 LAB — LACTIC ACID, PLASMA: Lactic Acid, Venous: 1 mmol/L (ref 0.5–2.2)

## 2014-09-23 LAB — VANCOMYCIN, TROUGH: VANCOMYCIN TR: 21.1 ug/mL — AB (ref 10.0–20.0)

## 2014-09-23 MED ORDER — SODIUM CHLORIDE 0.9 % IV SOLN
INTRAVENOUS | Status: DC
  Administered 2014-09-23 (×2): via INTRAVENOUS
  Administered 2014-09-24: 75 mL/h via INTRAVENOUS

## 2014-09-23 MED ORDER — MORPHINE SULFATE 10 MG/5ML PO SOLN
2.5000 mg | ORAL | Status: DC | PRN
  Administered 2014-09-24: 2.5 mg via ORAL
  Filled 2014-09-23: qty 5

## 2014-09-23 MED ORDER — VANCOMYCIN HCL 500 MG IV SOLR
500.0000 mg | INTRAVENOUS | Status: DC
  Administered 2014-09-23: 500 mg via INTRAVENOUS
  Filled 2014-09-23 (×2): qty 500

## 2014-09-23 NOTE — Consult Note (Addendum)
Patient Brandy Short      DOB: 16-Sep-1941      ZDG:[id number]     Consult Note from the Palliative Medicine Team at [facility] Requested by: Dr. Verlon Au    PCP: Alesia Richards, MD Reason for Consultation: [place]   Phone Number:[phone] Related Symptom managment Assessment of patients Current state: 73 yr old white female with past medical history of HTN, diabetes, and probable long standing COPD that she hid well from her family ( reports patient falling asleep, falling , short of breath, on no home oxygen and had never seen a lung specialist). Recent xrays in ICU did not comment on this until  9/28 although CT in January noted "There is mild pulmonary scarring and emphysema in the superior  segment of the right lower lobe. There are a few 2-3 mm subpleural nodules not of any clinical significance. There is patchy density in the left lower lobe just above the diaphragm which could be due 2 bronchopneumonia or could be a sub solid adenocarcinoma. Follow-up CT in 3 months to be suggested to re-evaluate this region" She still actively smokes.  Patient was recently hospitalized when she developed respiratory failure prior to having arm surgery. No mention of COPD at that time. Family describes a noted "slowing down since Christmas with her biggest change of there summer.  She wasn't going out , she was weak, and short of breath. She has been noted by family to "whistle breathe" = purse lip breathe.   Goals of Care: 1.  Code Status: DNR   2. Scope of Treatment: Family would like to treat the UTI /Pneumonia for tonight and not use the bipap.  If she starts to turn the corner we will regroup, if she persists in being somnolent and toxic will further consider full comfort and hospice care. Family would like to err on the side of comfort so if she further declines and needs more aggressive   4. Disposition: to be determined.   3. Symptom Management:   1. Anxiety/Agitation:  not currently an issue 2. Pain: not currently an issue, has used opiates in the past per old record of home meds 3. Bowel Regimen: monitor 4. Delirium: more encephalopathy secondary to infection, hypoxia and CO2 narcosis 5. Fever: tylenol, 6. Chronic anemia.  No transfusion. Recheck labs one more time then consider dc labs 7. Hypoxic , hypercarbic respiratory failure: no wheezing , doubt steroids would add much, continue nebs. Would have low threshold to use opiates if she becomes distressed. Family ok with use comfort meds for anxiety and pain. Not using further bipap. 8. CKD with increasing creatinine. Follow. 9. Diabetes: CBG within reasonable ranges would not continue to check as she is not eating 10. Comfort feed only when awake.  4. Psychosocial: Private woman,    5. Spiritual: Methodist.        Patient Documents Completed or Given: Document Given Completed  Advanced Directives Pkt    MOST    DNR    Gone from My Sight    Hard Choices      Brief HPI: 73 yr old with likely significantly progressed COPD admitted earlier in the month with complication related to surgical procedure on fractured are. Course complicated by respiratory failure with intubation. Patient stabilized extubated but never really progressed at    ROS: unable to attend   PMH:  Past Medical History  Diagnosis Date  . Depression   . Diverticulosis   . Adenomatous colon  polyp   . Asthma   . Hypertension   . Mixed hyperlipidemia   . Type II or unspecified type diabetes mellitus without mention of complication, not stated as uncontrolled   . Anemia   . GERD (gastroesophageal reflux disease)   . Vitamin D deficiency   . Glaucoma   . Diverticulitis   . Heart murmur   . Pneumonia   . Cancer     basal cell carcinoma of the skin     PSH: Past Surgical History  Procedure Laterality Date  . Abdominal hysterectomy    . Breast biopsy    . Cyst removed from lower spine    . Lithotripsy    . Eye  surgery Bilateral 09/2003 Dr. Katy Fitch     Glaucoma   I have reviewed the [place] and SH and  If appropriate update it with new information. Allergies  Allergen Reactions  . Codeine Nausea Only and Other (See Comments)    Nightmares  . Ace Inhibitors Rash  . Actonel [Risedronate Sodium] Other (See Comments)    Dyspepsia  . Dilaudid [Hydromorphone Hcl] Rash  . Hyosol-Sl [Hyoscyamine] Other (See Comments)    Dry mouth  . Metformin And Related Diarrhea  . Phenobarbital Diarrhea  . Quinine Derivatives Rash  . Sulfur Rash  . Tetracyclines & Related Rash and Other (See Comments)    Skin peeled off    Scheduled Meds: . antiseptic oral rinse  7 mL Mouth Rinse q12n4p  . brimonidine  1 drop Right Eye BID  . ceFEPime (MAXIPIME) IV  1 g Intravenous Q24H  . chlorhexidine  15 mL Mouth Rinse BID  . dextromethorphan-guaiFENesin  1 tablet Oral BID  . dorzolamide-timolol  2 drop Right Eye BID  . enoxaparin (LOVENOX) injection  30 mg Subcutaneous QHS  . Influenza vac split quadrivalent PF  0.5 mL Intramuscular Tomorrow-1000  . latanoprost  1 drop Both Eyes QHS  . vancomycin  750 mg Intravenous Q24H   Continuous Infusions: . sodium chloride 75 mL/hr at 09/23/14 0806   PRN Meds:.albuterol, alum & mag hydroxide-simeth, ondansetron (ZOFRAN) IV, promethazine    BP 150/55  Pulse 96  Temp(Src) 99.2 F (37.3 C) (Oral)  Resp 28  Ht 5\' 5"  (1.651 m)  Wt 60.8 kg (134 lb 0.6 oz)  BMI 22.31 kg/m2  SpO2 99%   PPS: 20%   Intake/Output Summary (Last 24 hours) at 09/23/14 1548 Last data filed at 09/23/14 1130  Gross per 24 hour  Intake    200 ml  Output    250 ml  Net    -50 ml   LBM: 9/30                     Physical Exam:  General:  Somnolent, will awaken with vigorous touch and loud vocal stimulation, not speaking much but will smile HEENT:  PERRL, EOMI, anicteric,mm dry Chest:   Decreased with distant breath sounds CVS: regular with ?harsh murmur at the apex and second ICS right Abdomen:  soft, not tender, positive bowel sounds Ext: warm no lesions,  Neuro:impaired but will awaken and fall off to sleep easily.  Labs: CBC    Component Value Date/Time   WBC 12.1* 09/23/2014 0359   RBC 2.33* 09/23/2014 0359   HGB 7.6* 09/23/2014 0359   HCT 23.5* 09/23/2014 0359   PLT 356 09/23/2014 0359   MCV 100.9* 09/23/2014 0359   MCH 32.6 09/23/2014 0359   MCHC 32.3 09/23/2014 0359   RDW 15.1  09/23/2014 0359   LYMPHSABS 0.6* 09/23/2014 0359   MONOABS 1.2* 09/23/2014 0359   EOSABS 0.0 09/23/2014 0359   BASOSABS 0.0 09/23/2014 0359      CMP     Component Value Date/Time   NA 136* 09/23/2014 0359   K 4.9 09/23/2014 0359   CL 96 09/23/2014 0359   CO2 27 09/23/2014 0359   GLUCOSE 120* 09/23/2014 0359   BUN 61* 09/23/2014 0359   CREATININE 2.13* 09/23/2014 0359   CREATININE 1.29* 06/02/2014 1309   CALCIUM 8.2* 09/23/2014 0359   PROT 5.7* 09/23/2014 0359   ALBUMIN 2.3* 09/23/2014 0359   AST 16 09/23/2014 0359   ALT 13 09/23/2014 0359   ALKPHOS 95 09/23/2014 0359   BILITOT 0.3 09/23/2014 0359   GFRNONAA 22* 09/23/2014 0359   GFRNONAA 41* 06/02/2014 1309   GFRAA 25* 09/23/2014 0359   GFRAA 47* 06/02/2014 1309    Chest Xray Reviewed/Impressions:  1. Persistent left lower lobe infiltrate and atelectasis with  persistent left pleural effusion.  2. New right lower lobe infiltrate      Time In Time Out Total Time Spent with Patient Total Overall Time  1230 pm 2 pm 90 min 90 min    Greater than 50%  of this time was spent counseling and coordinating care related to the above assessment and plan.   Timaya Bojarski L. Lovena Le, MD MBA The Palliative Medicine Team at [facility] Phone: [phone] Pager: [phone] ( Use team phone after hours)

## 2014-09-23 NOTE — Care Management Note (Signed)
    Page 1 of 1   09/23/2014     12:41:09 PM CARE MANAGEMENT NOTE 09/23/2014  Patient:  Brandy Short, Brandy Short   Account Number:  [id number]  Date Initiated:  09/22/2014  Documentation initiated by:  Marvetta Gibbons  Subjective/Objective Assessment:   Pt admitted with HCAP and resp. distress     Action/Plan:   PTA pt lived at home- was recently discharged from SNF   Anticipated DC Date:     Anticipated DC Plan:  [place] referral  Clinical Social Worker      DC Planning Services  CM consult      Choice offered to / List presented to:             Status of service:  In process, will continue to follow Medicare Important Message given?  YES (If response is "NO", the following Medicare IM given date fields will be blank) Date Medicare IM given:  09/23/2014 Medicare IM given by:  Marvetta Gibbons Date Additional Medicare IM given:   Additional Medicare IM given by:    Discharge Disposition:    Per UR Regulation:  Reviewed for med. necessity/level of care/duration of stay  If discussed at [place] of Stay Meetings, dates discussed:    Comments:

## 2014-09-23 NOTE — Progress Notes (Addendum)
ANTIBIOTIC CONSULT NOTE - FOLLOW UP  Pharmacy Consult for Vancomycin/Zosyn Indication: HCAP and sepsis  Allergies  Allergen Reactions  . Codeine Nausea Only and Other (See Comments)    Nightmares  . Ace Inhibitors Rash  . Actonel [Risedronate Sodium] Other (See Comments)    Dyspepsia  . Dilaudid [Hydromorphone Hcl] Rash  . Hyosol-Sl [Hyoscyamine] Other (See Comments)    Dry mouth  . Metformin And Related Diarrhea  . Phenobarbital Diarrhea  . Quinine Derivatives Rash  . Sulfur Rash  . Tetracyclines & Related Rash and Other (See Comments)    Skin peeled off     Patient Measurements: Height: 5\' 5"  (165.1 cm) Weight: 134 lb 0.6 oz (60.8 kg) IBW/kg (Calculated) : 57 Adjusted Body Weight:   Vital Signs: Temp: 98.7 F (37.1 C) (10/01 0754) Temp src: Axillary (10/01 0754) BP: 150/55 mmHg (10/01 0754) Pulse Rate: 96 (10/01 0754) Intake/Output from previous day: 09/30 0701 - 10/01 0700 In: 450 [IV Piggyback:450] Out: 279 [Urine:279] Intake/Output from this shift: Total I/O In: -  Out: 30 [Urine:30]  Labs:  Recent Labs  09/21/14 0327 09/21/14 2349 09/22/14 0240 09/23/14 0359  WBC 9.3  --  13.3* 12.1*  HGB 8.1*  --  7.7* 7.6*  PLT 410*  --  396 356  CREATININE 1.55* 1.97* 1.96* 2.13*   Estimated Creatinine Clearance: 21.2 ml/min (by C-G formula based on Cr of 2.13). No results found for this basename: VANCOTROUGH, Corlis Leak, VANCORANDOM, [place], [place], [place], [place], [place], TOBRARND, AMIKACINPEAK, AMIKACINTROU, AMIKACIN,  in the last 72 hours   Microbiology: Recent Results (from the past 720 hour(s))  URINE CULTURE     Status: None   Collection Time    09/20/14  3:22 PM      Result Value Ref Range Status   Specimen Description URINE, CATHETERIZED   Final   Special Requests ADDED [id number]   Final   Culture  Setup Time     Final   Value: 09/20/2014 23:39     Performed at [facility]     Final   Value: >=100,000  COLONIES/ML     Performed at [facility]   Culture     Final   Value: KLEBSIELLA PNEUMONIAE     Performed at [facility]   Report Status 09/23/2014 FINAL   Final   Organism ID, Bacteria KLEBSIELLA PNEUMONIAE   Final  CULTURE, BLOOD (ROUTINE X 2)     Status: None   Collection Time    09/20/14  8:50 PM      Result Value Ref Range Status   Specimen Description BLOOD RIGHT ARM   Final   Special Requests BOTTLES DRAWN AEROBIC AND ANAEROBIC 5CC   Final   Culture  Setup Time     Final   Value: 09/21/2014 00:32     Performed at [facility]   Culture     Final   Value:        BLOOD CULTURE RECEIVED NO GROWTH TO DATE CULTURE WILL BE HELD FOR 5 DAYS BEFORE ISSUING A FINAL NEGATIVE REPORT     Performed at [facility]   Report Status PENDING   Incomplete  CULTURE, BLOOD (ROUTINE X 2)     Status: None   Collection Time    09/20/14  9:01 PM      Result Value Ref Range Status   Specimen Description BLOOD LEFT HAND   Final   Special Requests BOTTLES DRAWN AEROBIC  ONLY 4CC   Final   Culture  Setup Time     Final   Value: 09/21/2014 00:32     Performed at [facility]   Culture     Final   Value:        BLOOD CULTURE RECEIVED NO GROWTH TO DATE CULTURE WILL BE HELD FOR 5 DAYS BEFORE ISSUING A FINAL NEGATIVE REPORT     Performed at [facility]   Report Status PENDING   Incomplete    Anti-infectives   Start     Dose/Rate Route Frequency Ordered Stop   09/21/14 1600  ceFEPIme (MAXIPIME) 1 g in dextrose 5 % 50 mL IVPB     1 g 100 mL/hr over 30 Minutes Intravenous Every 24 hours 09/20/14 1738 09/28/14 1559   09/20/14 1800  ceFEPIme (MAXIPIME) 1 g in dextrose 5 % 50 mL IVPB  Status:  Discontinued     1 g 100 mL/hr over 30 Minutes Intravenous Every 24 hours 09/20/14 1736 09/20/14 1738   09/20/14 1745  ceFEPIme (MAXIPIME) 1 g in dextrose 5 % 50 mL IVPB  Status:  Discontinued     1 g 100 mL/hr over 30 Minutes Intravenous 3 times per day 09/20/14  1736 09/20/14 1736   09/20/14 1630  vancomycin (VANCOCIN) IVPB 750 mg/150 ml premix     750 mg 150 mL/hr over 60 Minutes Intravenous Every 24 hours 09/20/14 1617     09/20/14 1545  ceFEPIme (MAXIPIME) 1 g in dextrose 5 % 50 mL IVPB     1 g 100 mL/hr over 30 Minutes Intravenous  Once 09/20/14 1535 09/20/14 1647      Assessment: 73 yo female admitted with confusion and lethargy, pharmacy asked to begin empiric vancomycin for PNA, sepsis with hypothermia. (Recent complicated hospital admit 8/27-9/3)  Anticoagulation: Lovenox 40mg >30mg /day. Hgb down to 7.6. Plts ok.  Infectious Disease: Severe sepsis with hypothermia from HCAP. Temp max 99.4. WBC 12,1. F/u cultures. Would recommend changing Cefepime to Zosyn-ok by Dr. Verlon Au 9/28 Vanco>> 9/28 Cefepime>>  9/28 BC x 2>> pending 9/28 UCx: Klebsiella P.   Cardiovascular: ST-T wave changes left heart cath showed clean coronaries. BP 150/55, HR 96. No CV meds  Endocrinology: DM, SSI, CBGs 104-168 (Levemir d/c'd)  Gastrointestinal / Nutrition: [place]  Neurology: Encephalopathy secondary to PNA. Bed bound.  Nephrology: CKD, Scr 2.13 up. UOP 0.2 noted  Pulmonary: COPD, 99% on 3L, +tpbacco  Hematology / Oncology: Anemic with Hgb 7.6 down.   Goal of Therapy:  Vancomycin trough level 15-20 mcg/ml  Plan:  Vancomycin trough this PM. Change Cefepime to Zosyn  Zosyn 3.375g IV q8hr.   Crystal S. Alford Highland, PharmD, BCPS Clinical Staff Pharmacist Pager [phone]  Eilene Ghazi Stillinger 09/23/2014,10:26 AM  Addendum: Vancomycin trough is slightly above goal at 21.2. Since sCr is trending up will decrease vancomycin dose.   Plan: 1) Decrease vancomycin to 500mg  q24 2) Continue to follow renal function and check another level at new steady state  Nena Jordan, PharmD, BCPS 09/23/2014, 5:36 PM

## 2014-09-23 NOTE — Progress Notes (Signed)
Note: This document was prepared with digital dictation and possible smart phrase technology. Any transcriptional errors that result from this process are unintentional.   Brandy Short BUL:[id number] DOB: 1941/03/03 DOA: 09/20/2014 PCP: Alesia Richards, MD  Brief narrative: 73 y/o ?, h/o hiatal hernia with [name] lesions/mild nonobstructive strictures, recent admit 8/27-9/3 for possible CAP + encephalopathy and Humeral #, complicated hospitalization by acute respiratory failure, ST-T wave changes left heart cath showed clean coronaries readmitted to hospital 9/28 with decreased oral intake decreased level of consciousness and metabolic encephalopathy lab workup showed stage I kidney disease, BNP 3000, left 0.8, 0.2 troponin 0.23, no white count, chest x-ray showed new left lower lobe pneumonia small pleural effusion and changes of COPD She developed acute hypercarbic respiratory failure over 9/30 and needed to be placed on BiPAP She is a DO NOT RESUSCITATE/DO NOT INTUBATE and family is contemplating goals of care which have been requested by them  Past medical history-As per Problem list Chart reviewed as below-  review   Consultants:   none   Procedures:   chest x-ray   Antibiotics:  cefepime 9/28    vancomycin 9/28    blood cultures Pending  urine cultures-Klebsiella 9/28   Subjective   Patient seen off BIpap Confused Doesn;t follow commands well Cannot get ROS   Objective    Interim History:   Telemetry: Sinus   Objective: Filed Vitals:   09/22/14 2000 09/23/14 0110 09/23/14 0344 09/23/14 0445  BP: 144/44 145/40 151/54 142/47  Pulse: 73 94 72 68  Temp: 99.4 F (37.4 C)  98.2 F (36.8 C)   TempSrc: Axillary  Axillary   Resp: 17  14 18   Height:      Weight:      SpO2: 99%  98%     Intake/Output Summary (Last 24 hours) at 09/23/14 0737 Last data filed at 09/23/14 0356  Gross per 24 hour  Intake    450 ml  Output    279 ml  Net    171 ml      Exam:  General: EOMI NCAT PERRLA Cardiovascular: S1-S2 regular rate rhythm, long holosystolic murmur left lower sternal edge and right lower sternal edge Respiratory: Clinically clear no added sounds Abdomen: Soft nontender no rebound no guarding Skin edema resolved Neuro somnolent, not arousable  Data Reviewed: Basic Metabolic Panel:  Recent Labs Lab 09/20/14 1359 09/21/14 0327 09/21/14 2349 09/22/14 0240 09/23/14 0359  NA 132* 132* 133* 134* 136*  K 4.7 4.7 4.9 5.0 4.9  CL 90* 90* 93* 93* 96  CO2 29 27 30 27 27   GLUCOSE 171* 163* 101* 97 120*  BUN 36* 42* 49* 50* 61*  CREATININE 1.44* 1.55* 1.97* 1.96* 2.13*  CALCIUM 9.0 8.7 8.4 8.5 8.2*   Liver Function Tests:  Recent Labs Lab 09/21/14 2349 09/22/14 0240 09/23/14 0359  AST 21 19 16   ALT 16 16 13   ALKPHOS 107 106 95  BILITOT 0.2* 0.2* 0.3  PROT 6.4 6.1 5.7*  ALBUMIN 2.5* 2.5* 2.3*   No results found for this basename: LIPASE, AMYLASE,  in the last 168 hours No results found for this basename: AMMONIA,  in the last 168 hours CBC:  Recent Labs Lab 09/20/14 1359 09/21/14 0327 09/22/14 0240 09/23/14 0359  WBC 9.4 9.3 13.3* 12.1*  NEUTROABS 8.4*  --  12.0* 10.3*  HGB 8.2* 8.1* 7.7* 7.6*  HCT 25.6* 26.1* 24.5* 23.5*  MCV 98.8 100.4* 102.5* 100.9*  PLT 399 410* 396 356  Cardiac Enzymes: No results found for this basename: CKTOTAL, CKMB, CKMBINDEX, TROPONINI,  in the last 168 hours BNP: No components found with this basename: POCBNP,  CBG:  Recent Labs Lab 09/22/14 1207 09/22/14 1543 09/22/14 2025 09/22/14 2344 09/23/14 0343  GLUCAP 129* 140* 168* 113* 122*    Recent Results (from the past 240 hour(s))  URINE CULTURE     Status: None   Collection Time    09/20/14  3:22 PM      Result Value Ref Range Status   Specimen Description URINE, CATHETERIZED   Final   Special Requests ADDED [id number]   Final   Culture  Setup Time     Final   Value: 09/20/2014 23:39     Performed at [place]     Final   Value: >=100,000 COLONIES/ML     Performed at [facility]   Culture     Final   Value: KLEBSIELLA PNEUMONIAE     Performed at [facility]   Report Status 09/23/2014 FINAL   Final   Organism ID, Bacteria KLEBSIELLA PNEUMONIAE   Final  CULTURE, BLOOD (ROUTINE X 2)     Status: None   Collection Time    09/20/14  8:50 PM      Result Value Ref Range Status   Specimen Description BLOOD RIGHT ARM   Final   Special Requests BOTTLES DRAWN AEROBIC AND ANAEROBIC 5CC   Final   Culture  Setup Time     Final   Value: 09/21/2014 00:32     Performed at [facility]   Culture     Final   Value:        BLOOD CULTURE RECEIVED NO GROWTH TO DATE CULTURE WILL BE HELD FOR 5 DAYS BEFORE ISSUING A FINAL NEGATIVE REPORT     Performed at [facility]   Report Status PENDING   Incomplete  CULTURE, BLOOD (ROUTINE X 2)     Status: None   Collection Time    09/20/14  9:01 PM      Result Value Ref Range Status   Specimen Description BLOOD LEFT HAND   Final   Special Requests BOTTLES DRAWN AEROBIC ONLY 4CC   Final   Culture  Setup Time     Final   Value: 09/21/2014 00:32     Performed at [facility]   Culture     Final   Value:        BLOOD CULTURE RECEIVED NO GROWTH TO DATE CULTURE WILL BE HELD FOR 5 DAYS BEFORE ISSUING A FINAL NEGATIVE REPORT     Performed at [facility]   Report Status PENDING   Incomplete     Studies:              All Imaging reviewed and is as per above notation   Scheduled Meds: . antiseptic oral rinse  7 mL Mouth Rinse q12n4p  . brimonidine  1 drop Right Eye BID  . ceFEPime (MAXIPIME) IV  1 g Intravenous Q24H  . chlorhexidine  15 mL Mouth Rinse BID  . dextromethorphan-guaiFENesin  1 tablet Oral BID  . dorzolamide-timolol  2 drop Right Eye BID  . enoxaparin (LOVENOX) injection  30 mg Subcutaneous QHS  . Influenza vac split quadrivalent PF  0.5 mL Intramuscular Tomorrow-1000  . insulin  aspart  0-9 Units Subcutaneous 6 times per day  . latanoprost  1 drop Both Eyes QHS  .  vancomycin  750 mg Intravenous Q24H   Continuous Infusions:    Assessment/Plan:  1. Severe sepsis with hypothermia secondary to Healthcare associated pneumonia + pyelonephritis secondary to Klebsiella-continue vancomycin cefepime.  lactic acid 1.1 this a.m., strep pneumo was negative.  Family aware prognosis is poor and palliative medicine has been consulted 2. Acute kidney injury secondary to poor by mouth intake-baseline creatinine 1.5, 22.13. Continue IV fluids 75 cc an hour- Uop dropping and remains low 3. Hypercarbic respiratory failure-monitor. 4. Toxic metabolic encephalopathy secondary to sepsis and #2-hopefully will recover 5. Elevated proBNP-likely secondary to sepsis. Echocardiogram 8/27 negative as was cardiac cath last admission 6. Diabetes mellitus type 2 insulin-dependent-continue Levemir 10 units each bedtime as well as sliding scale coverage-blood sugar is 110-122 7. Possible COPD + curerent daily smoker-no wheeze currently. Monitor  Code Status: Full Family Communication:  Disposition Plan: SDU    Performed by: Creola Corn, MD  [facility] Pager [phone] 09/23/2014, 7:37 AM    LOS: 3 days

## 2014-09-24 DIAGNOSIS — J439 Emphysema, unspecified: Secondary | ICD-10-CM

## 2014-09-24 DIAGNOSIS — J9602 Acute respiratory failure with hypercapnia: Secondary | ICD-10-CM

## 2014-09-24 LAB — BASIC METABOLIC PANEL
Anion gap: 14 (ref 5–15)
BUN: 64 mg/dL — AB (ref 6–23)
CO2: 26 mEq/L (ref 19–32)
Calcium: 7.9 mg/dL — ABNORMAL LOW (ref 8.4–10.5)
Chloride: 96 mEq/L (ref 96–112)
Creatinine, Ser: 1.78 mg/dL — ABNORMAL HIGH (ref 0.50–1.10)
GFR calc non Af Amer: 27 mL/min — ABNORMAL LOW (ref >90)
GFR, EST AFRICAN AMERICAN: 31 mL/min — AB (ref >90)
Glucose, Bld: 146 mg/dL — ABNORMAL HIGH (ref 70–99)
POTASSIUM: 4.5 meq/L (ref 3.7–5.3)
Sodium: 136 mEq/L — ABNORMAL LOW (ref 137–147)

## 2014-09-24 LAB — CBC WITH DIFFERENTIAL/PLATELET
Basophils Absolute: 0 10*3/uL (ref 0.0–0.1)
Basophils Relative: 0 % (ref 0–1)
Eosinophils Absolute: 0 10*3/uL (ref 0.0–0.7)
Eosinophils Relative: 0 % (ref 0–5)
HCT: 23.6 % — ABNORMAL LOW (ref 36.0–46.0)
Hemoglobin: 7.4 g/dL — ABNORMAL LOW (ref 12.0–15.0)
LYMPHS ABS: 0.4 10*3/uL — AB (ref 0.7–4.0)
Lymphocytes Relative: 4 % — ABNORMAL LOW (ref 12–46)
MCH: 32.2 pg (ref 26.0–34.0)
MCHC: 31.4 g/dL (ref 30.0–36.0)
MCV: 102.6 fL — AB (ref 78.0–100.0)
MONOS PCT: 6 % (ref 3–12)
Monocytes Absolute: 0.8 10*3/uL (ref 0.1–1.0)
NEUTROS PCT: 90 % — AB (ref 43–77)
Neutro Abs: 10.8 10*3/uL — ABNORMAL HIGH (ref 1.7–7.7)
Platelets: 325 10*3/uL (ref 150–400)
RBC: 2.3 MIL/uL — AB (ref 3.87–5.11)
RDW: 14.9 % (ref 11.5–15.5)
WBC: 12.1 10*3/uL — ABNORMAL HIGH (ref 4.0–10.5)

## 2014-09-24 LAB — LACTIC ACID, PLASMA: Lactic Acid, Venous: 0.8 mmol/L (ref 0.5–2.2)

## 2014-09-24 MED ORDER — MORPHINE SULFATE 10 MG/5ML PO SOLN: 2.5000 mg | ORAL | Status: AC | PRN

## 2014-09-24 NOTE — Progress Notes (Signed)
Patient Brandy Short      DOB: July 11, 1941      TJQ:[id number]   Palliative Medicine Team at [facility] Progress Note    Subjective: Patient with not much improvement, elevated white count, loose cough, falling off to sleep easily and dropping sats. She is not awake enough to eat or drink and her creatinine has been up and down. Crt now 1.78. She is malnuourished with an albumin of 2.3.  Reviewed old xrays with family and CT scan from January which they were not aware of.     Filed Vitals:   09/24/14 0800  BP: 130/41  Pulse: 89  Temp: 97.7 F (36.5 C)  Resp: 14   Physical exam:  General: a little bit more awake today but still falls off to sleep PERRL, EOMI, mild jaw tremor with tongue protrusion Chest decreased with course wet cough CVS: regular, S1, S2 Abd: soft Ext: warm, trace edema Neuro: oriented to herself, when asked if she has any worries or questions she states "no, I don't want to know"    Lab Results  Component Value Date   WBC 12.1* 09/24/2014   HGB 7.4* 09/24/2014   HCT 23.6* 09/24/2014   MCV 102.6* 09/24/2014   PLT 325 09/24/2014   Lab Results  Component Value Date   CREATININE 1.78* 09/24/2014   BUN 64* 09/24/2014   NA 136* 09/24/2014   K 4.5 09/24/2014   CL 96 09/24/2014   CO2 26 09/24/2014    Assessment and plan: 73 yr old white female with history of asthma which over the years has overlapping emphysema.  Patient returns to hospital with respiratory failure and pneumonia within the context of declining health since before Christmas. Family has elected full comfort with transition to hospice home if bed available.  1.  DNR  Full comfort ,  Hospice transition  2.  DC abx.  3. Jaw tremor- may be extrapyramidal effect from phenergan, will dc and if recurs will consider benadryl low dose or cogentin.   4.  Hypoxic hypercarbic respiratory failure-  No further bipap, stopping abx. Treat symptomatically.  Roxanol added yesterday as needed, oxygen and nebs  as needed.  Dr. Verlon Au updated. SW contacted to offer choice. Total time 1230-136 pm  Stetson Pelaez L. Lovena Le, MD MBA The Palliative Medicine Team at [facility] Phone: [phone] Pager: [phone] ( Use team phone after hours)

## 2014-09-24 NOTE — Progress Notes (Signed)
Pt transferred to [facility] via EMS. Pt given 2.5mg  po morphine liquid before transport due to positive CPOT score and complaining of right shoulder pain. Pt nephew, Scott at bedside. All d.c instructions given to nephew, and all questions answered. Pt belongings (clothing, sling and dentures) sent with Scott. Report called to RN at [place].

## 2014-09-24 NOTE — Clinical Social Work Note (Addendum)
CSW met the pt and family by the bedside. CSW introduce self and purpose of visit. CSW provided residental hospice list to the family.  The family reported they will review the list and contacted the CSW once a decision has been made. CSW will continue to provide support and assist with discharge needs.    Addendum:  Pt has been faxed out to 4 hospice agencies:[facility], and [facility]. CSW is awaiting a bed offer.   Addendum:  Pt's has a bed offer at [facility]. CSW contact [facility] at [phone] to schedule transport for the pt. Pt's POA Scott was notified.   [place], MSW, [place]

## 2014-09-24 NOTE — Discharge Summary (Signed)
Physician Discharge Summary  ELVA MAURO JME:[id number] DOB: Jun 04, 1941 DOA: 09/20/2014  PCP: Alesia Richards, MD  Admit date: 09/20/2014 Discharge date: 09/24/2014  Time spent: 35 minutes  Recommendations for Outpatient Follow-up:  1. Patient to be d/c to Residential hospice facility  Discharge Diagnoses:  Active Problems:   HCAP (healthcare-associated pneumonia)   COPD (chronic obstructive pulmonary disease) with emphysema   Discharge Condition: Guarded  Diet recommendation: Comfort  Filed Weights   09/20/14 2025 09/22/14 0619  Weight: 59.2 kg (130 lb 8.2 oz) 60.8 kg (134 lb 0.6 oz)    History of present illness:  73 y/o ?, h/o hiatal hernia with [name] lesions/mild nonobstructive strictures, recent admit 8/27-9/3 for possible CAP + encephalopathy and Humeral #, complicated hospitalization by acute respiratory failure, ST-T wave changes left heart cath showed clean coronaries readmitted to hospital 9/28 with decreased oral intake decreased level of consciousness and metabolic encephalopathy  lab workup showed stage I kidney disease, BNP 3000, left 0.8, 0.2 troponin 0.23, no white count, chest x-ray showed new left lower lobe pneumonia small pleural effusion and changes of COPD  She developed acute hypercarbic respiratory failure over 9/30 and needed to be placed on BiPAP  She is a DO NOT RESUSCITATE/DO NOT INTUBATE and family is contemplating goals of care  It was ultimately decided that it with transition to comfort care. The head was discontinued. Insulin as well as other life-sustaining medications were discontinued. On bisoprolol. Patient to keep Foley in for comfort. She was started on Roxanol by mouth for breathing shortness of breath     Hospital Course:  1. Severe sepsis with hypothermia secondary to Healthcare associated pneumonia + pyelonephritis secondary to Klebsiella-continue vancomycin cefepime. Goals of care performed and family did not wish further  antibiotics 2. Acute kidney injury secondary to poor by mouth intake-baseline creatinine 1.5 patient was on IV fluids initially but this was discontinue.   3. Hypercarbic respiratory failure secondary to COPD-initially was placed on BiPAP 9/30 and resolved over 09/23/14 however more some sleep subsequent and confused. Family elected for comfort care 4. Toxic metabolic encephalopathy secondary to sepsis and #2-hopefully will recover 5. Elevated proBNP-likely secondary to sepsis. Echocardiogram 8/27 negative as was cardiac cath last admission 6. Diabetes mellitus type 2 insulin-dependent-continue Levemir 10 units each bedtime blood sugars are moderately controlled and she was discontinued off on discharge    Consultants:  Palliative care Procedures:  chest x-ray  Antibiotics:  cefepime 9/28  vancomycin 9/28  blood cultures Pending  urine cultures-Klebsiella 9/28   Discharge Exam: Filed Vitals:   09/24/14 0800  BP: 130/41  Pulse: 89  Temp: 97.7 F (36.5 C)  Resp: 14    Alert but sleepy not in any distress   tolerating diet were little  Discharge Instructions You were cared for by a hospitalist during your hospital stay. If you have any questions about your discharge medications or the care you received while you were in the hospital after you are discharged, you can call the unit and asked to speak with the hospitalist on call if the hospitalist that took care of you is not available. Once you are discharged, your primary care physician will handle any further medical issues. Please note that NO REFILLS for any discharge medications will be authorized once you are discharged, as it is imperative that you return to your primary care physician (or establish a relationship with a primary care physician if you do not have one) for your aftercare needs so that they can  reassess your need for medications and monitor your lab values.  Discharge Instructions   Diet - low sodium heart healthy     Complete by:  As directed      Increase activity slowly    Complete by:  As directed           Current Discharge Medication List    START taking these medications   Details  morphine 10 MG/5ML solution Take 1.3 mLs (2.6 mg total) by mouth every 3 (three) hours as needed for moderate pain or severe pain (for increased WOB and resp distress RR > 24). Qty: 100 mL, Refills: 0      CONTINUE these medications which have NOT CHANGED   Details  albuterol (PROVENTIL) (2.5 MG/3ML) 0.083% nebulizer solution Take 3 mLs (2.5 mg total) by nebulization every 2 (two) hours as needed for wheezing or shortness of breath. Qty: 75 mL, Refills: 12    bisoprolol (ZEBETA) 10 MG tablet Take 5 mg by mouth at bedtime.     Difluprednate (DUREZOL) 0.05 % EMUL Place 1 drop into the left eye daily.     dorzolamide-timolol (COSOPT) 22.3-6.8 MG/ML ophthalmic solution Place 2 drops into the right eye 2 (two) times daily.     LUMIGAN 0.01 % SOLN Place 1 drop into the right eye 4 (four) times daily.     polyvinyl alcohol (ARTIFICIAL TEARS) 1.4 % ophthalmic solution Place 1 drop into both eyes as needed for dry eyes.      STOP taking these medications     albuterol (PROVENTIL HFA;VENTOLIN HFA) 108 (90 BASE) MCG/ACT inhaler      alum & mag hydroxide-simeth (MAALOX/MYLANTA) 200-200-20 MG/5ML suspension      brimonidine (ALPHAGAN) 0.15 % ophthalmic solution      hydrALAZINE (APRESOLINE) 100 MG tablet      insulin detemir (LEVEMIR) 100 UNIT/ML injection      moxifloxacin (AVELOX) 400 MG tablet      ondansetron (ZOFRAN) 4 MG tablet      traMADol (ULTRAM) 50 MG tablet        Allergies  Allergen Reactions  . Codeine Nausea Only and Other (See Comments)    Nightmares  . Ace Inhibitors Rash  . Actonel [Risedronate Sodium] Other (See Comments)    Dyspepsia  . Dilaudid [Hydromorphone Hcl] Rash  . Hyosol-Sl [Hyoscyamine] Other (See Comments)    Dry mouth  . Metformin And Related Diarrhea  .  Phenobarbital Diarrhea  . Quinine Derivatives Rash  . Sulfur Rash  . Tetracyclines & Related Rash and Other (See Comments)    Skin peeled off       The results of significant diagnostics from this hospitalization (including imaging, microbiology, ancillary and laboratory) are listed below for reference.    Significant Diagnostic Studies: Dg Chest 2 View  09/20/2014   CLINICAL DATA:  Confusion and lethargy; history of asthma and hypertension and chronic right humeral fracture.  EXAM: CHEST  2 VIEW  COMPARISON:  Portable chest x-ray of August 22, 2014.  FINDINGS: There is new infiltrate at the left lung base with small left pleural effusion layering posteriorly and laterally. The right lung is well-expanded. The interstitial markings of both lungs are increased. The cardiopericardial silhouette is normal in size. The central pulmonary vascularity is prominent. There is chronic deformity of the proximal right humerus.  IMPRESSION: There is new left lower lobe pneumonia with small left pleural effusion. There are stable changes of COPD.   Electronically Signed   By: David  Martinique  On: 09/20/2014 15:25   Dg Chest Port 1 View  09/22/2014   CLINICAL DATA:  Pneumonia .  EXAM: PORTABLE CHEST - 1 VIEW  COMPARISON:  09/12/2014.  FINDINGS: Mediastinum and hilar structures normal. Heart size is stable. Persistent left lower lobe infiltrate with left lower lobe atelectasis noted. Left pleural effusion is again present. Mild right lower lobe infiltrate noted today's exam. No pneumothorax. No acute osseus abnormality.  IMPRESSION: 1. Persistent left lower lobe infiltrate and atelectasis with persistent left pleural effusion. 2. New right lower lobe infiltrate.   Electronically Signed   By: Marcello Moores  Register   On: 09/22/2014 08:39    Microbiology: Recent Results (from the past 240 hour(s))  URINE CULTURE     Status: None   Collection Time    09/20/14  3:22 PM      Result Value Ref Range Status   Specimen  Description URINE, CATHETERIZED   Final   Special Requests ADDED [id number]   Final   Culture  Setup Time     Final   Value: 09/20/2014 23:39     Performed at [place]     Final   Value: >=100,000 COLONIES/ML     Performed at [facility]   Culture     Final   Value: KLEBSIELLA PNEUMONIAE     Performed at [facility]   Report Status 09/23/2014 FINAL   Final   Organism ID, Bacteria KLEBSIELLA PNEUMONIAE   Final  CULTURE, BLOOD (ROUTINE X 2)     Status: None   Collection Time    09/20/14  8:50 PM      Result Value Ref Range Status   Specimen Description BLOOD RIGHT ARM   Final   Special Requests BOTTLES DRAWN AEROBIC AND ANAEROBIC 5CC   Final   Culture  Setup Time     Final   Value: 09/21/2014 00:32     Performed at [facility]   Culture     Final   Value:        BLOOD CULTURE RECEIVED NO GROWTH TO DATE CULTURE WILL BE HELD FOR 5 DAYS BEFORE ISSUING A FINAL NEGATIVE REPORT     Performed at [facility]   Report Status PENDING   Incomplete  CULTURE, BLOOD (ROUTINE X 2)     Status: None   Collection Time    09/20/14  9:01 PM      Result Value Ref Range Status   Specimen Description BLOOD LEFT HAND   Final   Special Requests BOTTLES DRAWN AEROBIC ONLY 4CC   Final   Culture  Setup Time     Final   Value: 09/21/2014 00:32     Performed at [facility]   Culture     Final   Value:        BLOOD CULTURE RECEIVED NO GROWTH TO DATE CULTURE WILL BE HELD FOR 5 DAYS BEFORE ISSUING A FINAL NEGATIVE REPORT     Performed at [facility]   Report Status PENDING   Incomplete     Labs: Basic Metabolic Panel:  Recent Labs Lab 09/21/14 0327 09/21/14 2349 09/22/14 0240 09/23/14 0359 09/24/14 0306  NA 132* 133* 134* 136* 136*  K 4.7 4.9 5.0 4.9 4.5  CL 90* 93* 93* 96 96  CO2 27 30 27 27 26   GLUCOSE 163* 101* 97 120* 146*  BUN 42* 49* 50* 61* 64*  CREATININE 1.55* 1.97* 1.96* 2.13* 1.78*  CALCIUM 8.7 8.4 8.5  8.2* 7.9*   Liver Function Tests:  Recent Labs Lab 09/21/14 2349 09/22/14 0240 09/23/14 0359  AST 21 19 16   ALT 16 16 13   ALKPHOS 107 106 95  BILITOT 0.2* 0.2* 0.3  PROT 6.4 6.1 5.7*  ALBUMIN 2.5* 2.5* 2.3*   No results found for this basename: LIPASE, AMYLASE,  in the last 168 hours No results found for this basename: AMMONIA,  in the last 168 hours CBC:  Recent Labs Lab 09/20/14 1359 09/21/14 0327 09/22/14 0240 09/23/14 0359 09/24/14 0306  WBC 9.4 9.3 13.3* 12.1* 12.1*  NEUTROABS 8.4*  --  12.0* 10.3* 10.8*  HGB 8.2* 8.1* 7.7* 7.6* 7.4*  HCT 25.6* 26.1* 24.5* 23.5* 23.6*  MCV 98.8 100.4* 102.5* 100.9* 102.6*  PLT 399 410* 396 356 325   Cardiac Enzymes: No results found for this basename: CKTOTAL, CKMB, CKMBINDEX, TROPONINI,  in the last 168 hours BNP: BNP (last 3 results)  Recent Labs  01/20/14 1353 08/23/14 0418 09/20/14 1419  PROBNP 5546.0* 15994.0* 3105.0*   CBG:  Recent Labs Lab 09/22/14 2025 09/22/14 2344 09/23/14 0343 09/23/14 0753 09/23/14 1143  GLUCAP 168* 113* 122* 109* 157*       Signed:  Nita Sells  [facility] 09/24/2014, 1:25 PM

## 2014-09-27 LAB — CULTURE, BLOOD (ROUTINE X 2)
Culture: NO GROWTH
Culture: NO GROWTH

## 2014-10-21 ENCOUNTER — Ambulatory Visit: Payer: Self-pay | Admitting: Physician Assistant

## 2014-10-24 DEATH — deceased

## 2014-12-02 ENCOUNTER — Encounter (HOSPITAL_COMMUNITY): Payer: Self-pay | Admitting: Cardiovascular Disease

## 2014-12-22 ENCOUNTER — Ambulatory Visit: Payer: Self-pay | Admitting: Physician Assistant

## 2015-01-06 ENCOUNTER — Encounter (HOSPITAL_COMMUNITY): Payer: Self-pay | Admitting: Orthopedic Surgery

## 2015-06-07 IMAGING — CR DG SHOULDER 2+V*R*
3 series · 3 of 3 positions shown · non-contrast
Comparison: None

CLINICAL DATA: Patient tripped and fell this afternoon. Landed on
the shoulder. Pain and decreased range of motion.

EXAM:
RIGHT SHOULDER - 2+ VIEW

[w shoulder ap internal righ]
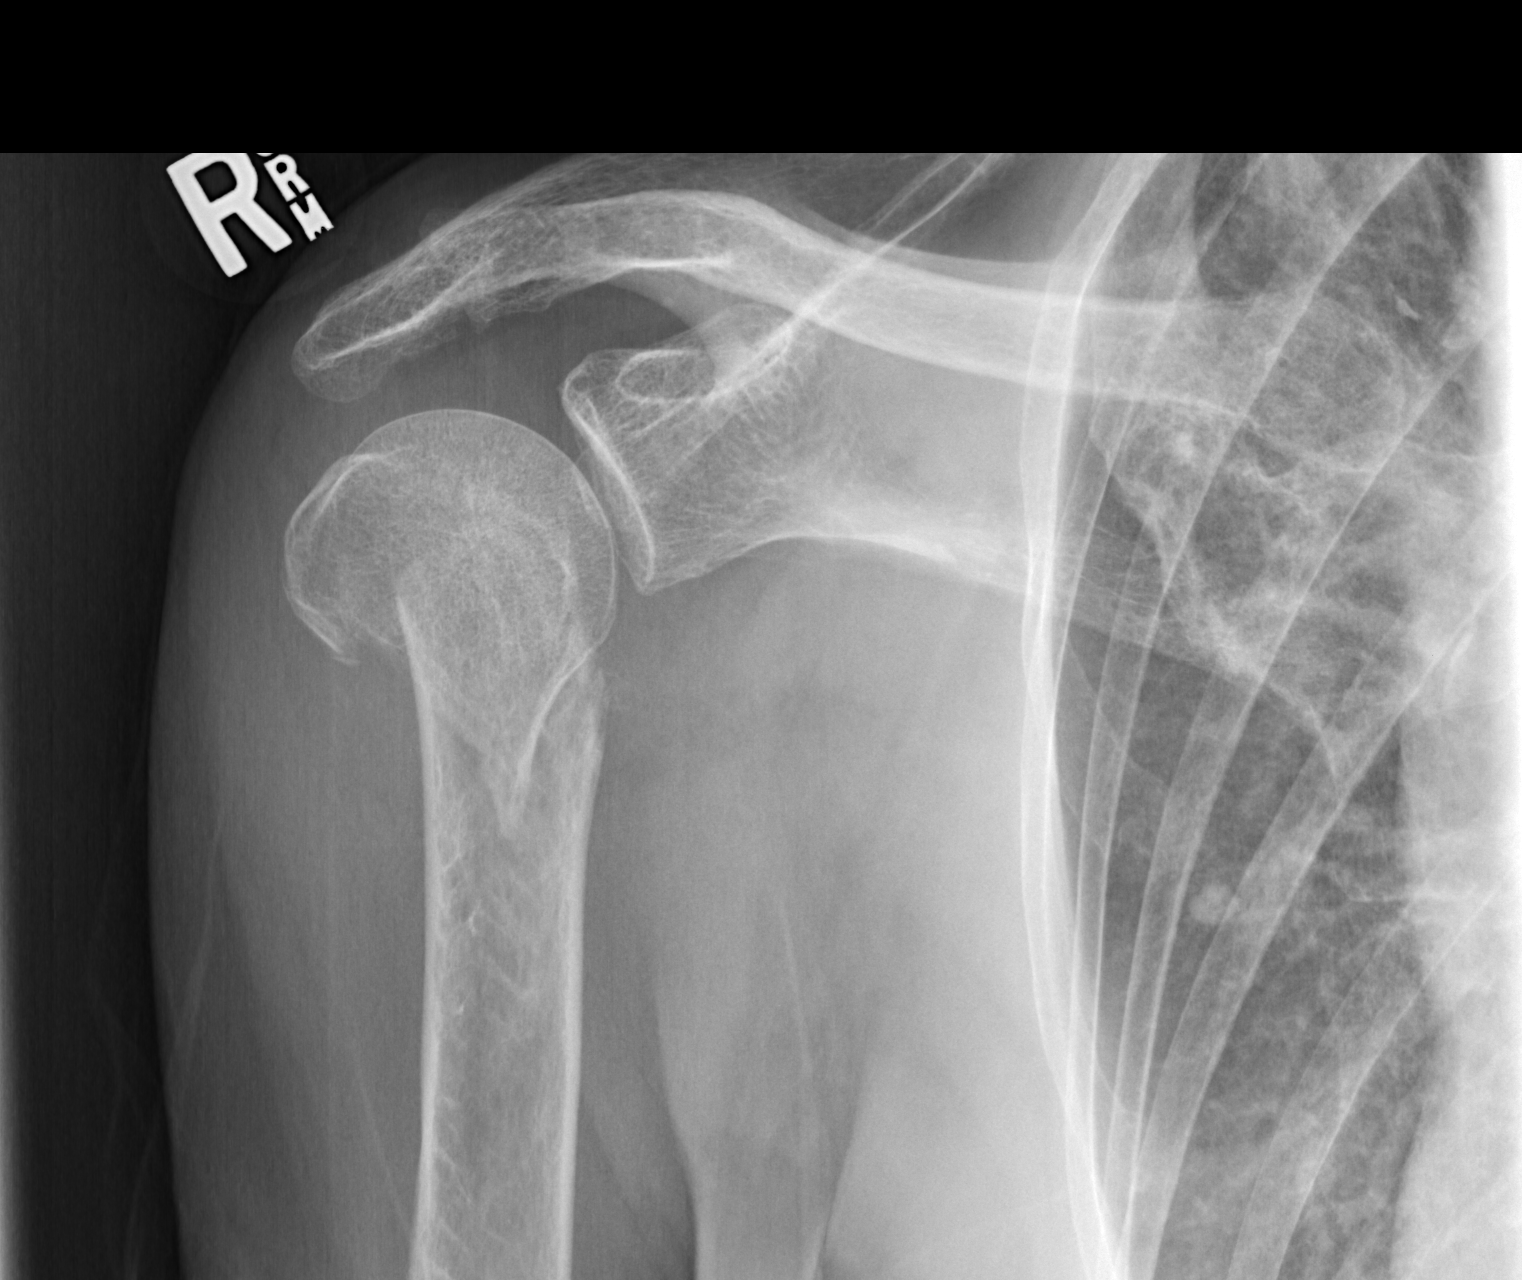

[w shoulder y view right]
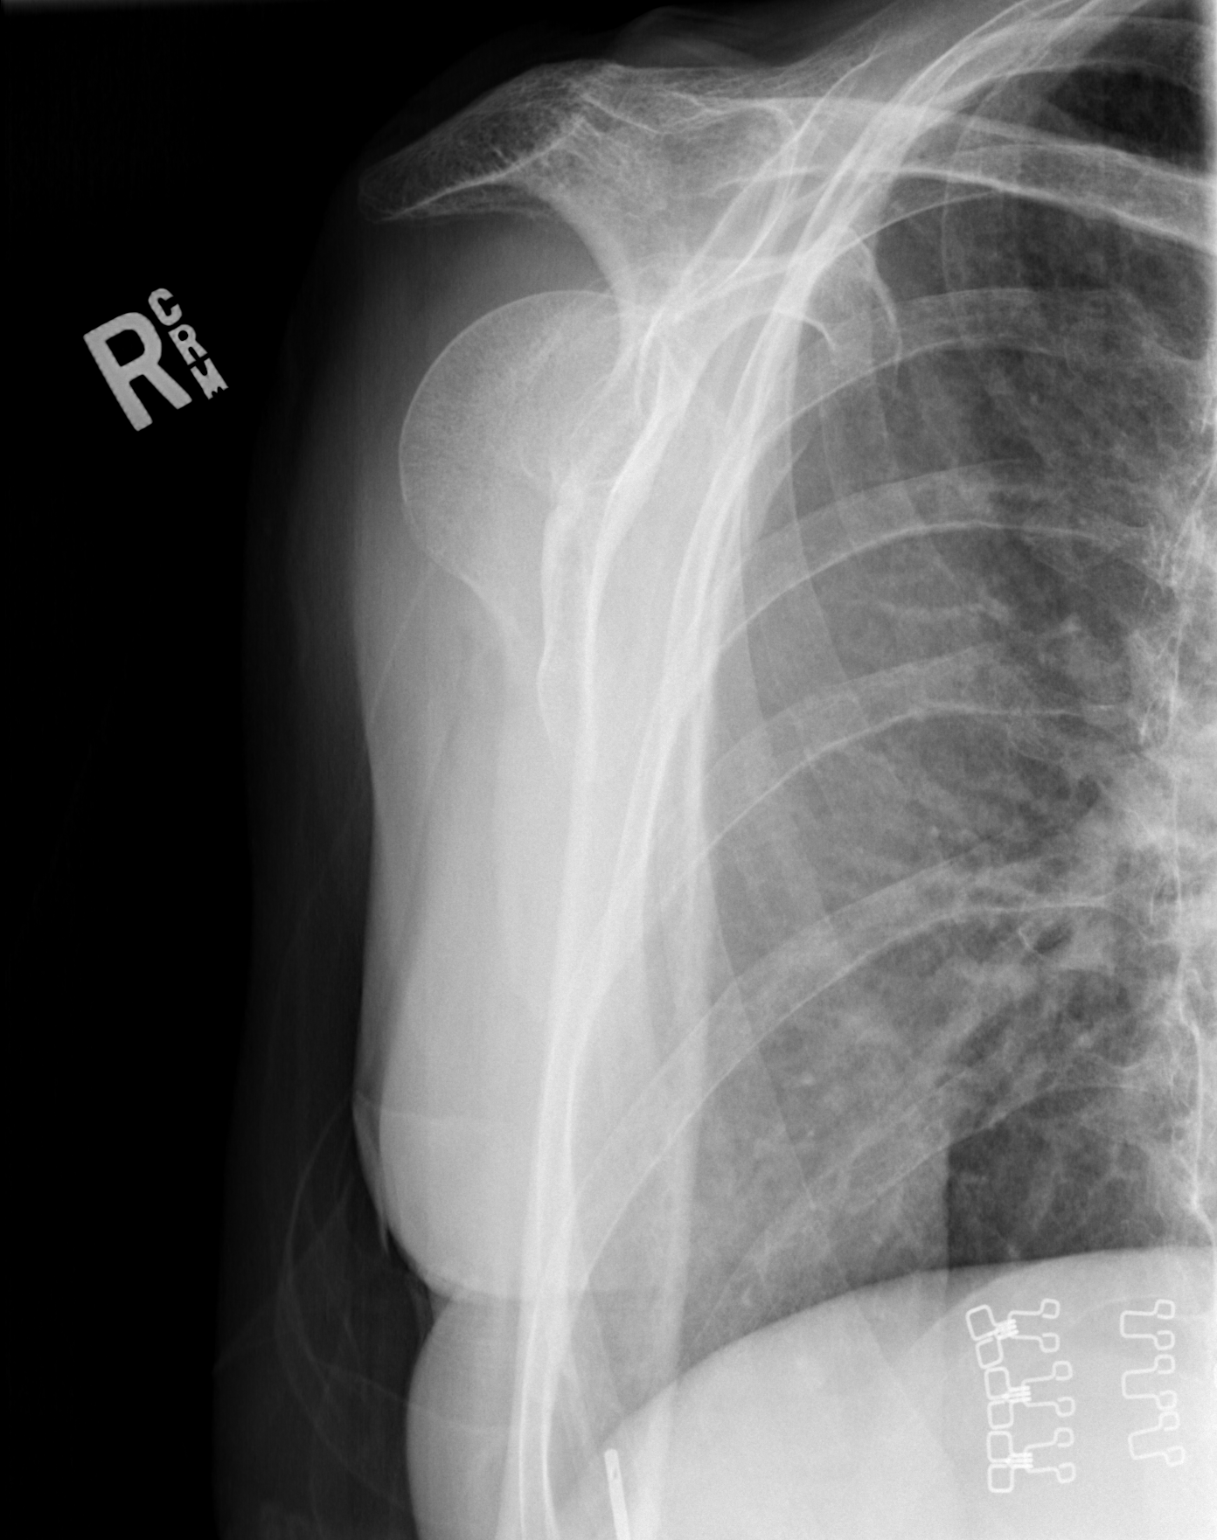

[w shoulder ap external righ]
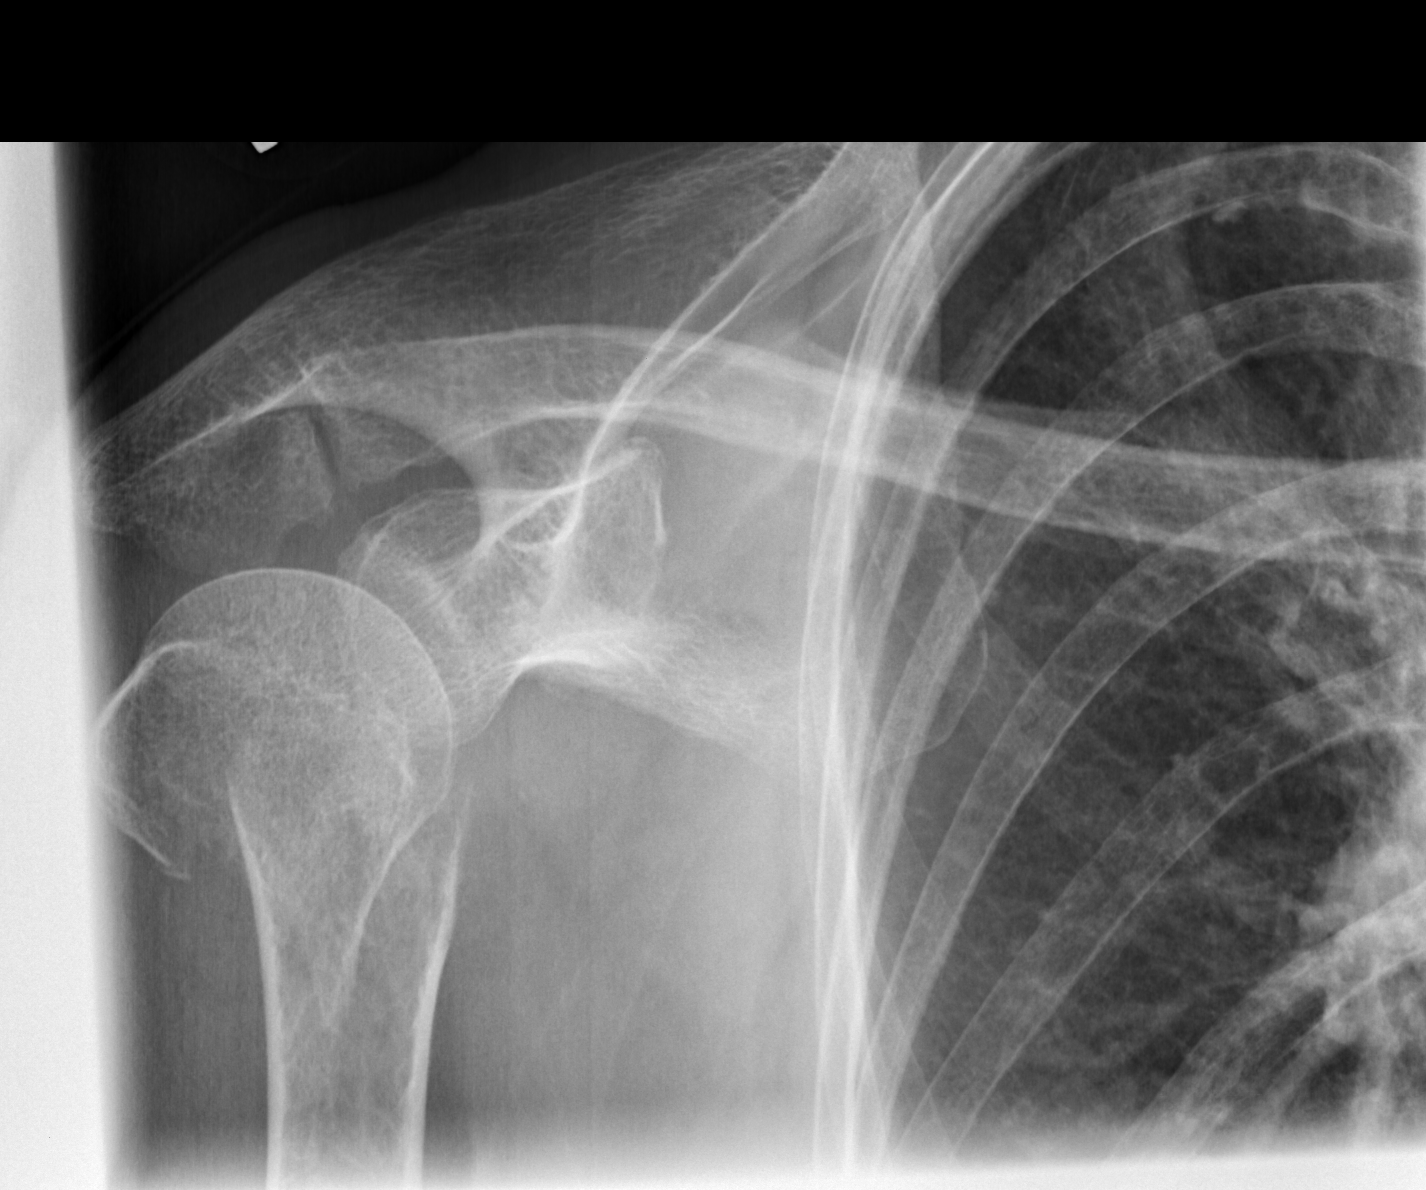

[3 of 3 positions shown; findings below may reference images not displayed]

FINDINGS: There is an oblique fracture of the right humeral neck. No
dislocation. The scapula appears intact.
IMPRESSION: Fracture of the right humeral neck.

## 2015-06-18 IMAGING — CR DG CHEST 1V PORT
1 series · 1 of 1 positions shown · non-contrast
Comparison: In 07/12/2014

CLINICAL DATA: Endotracheal tube placement

EXAM:
PORTABLE CHEST - 1 VIEW

[AP]
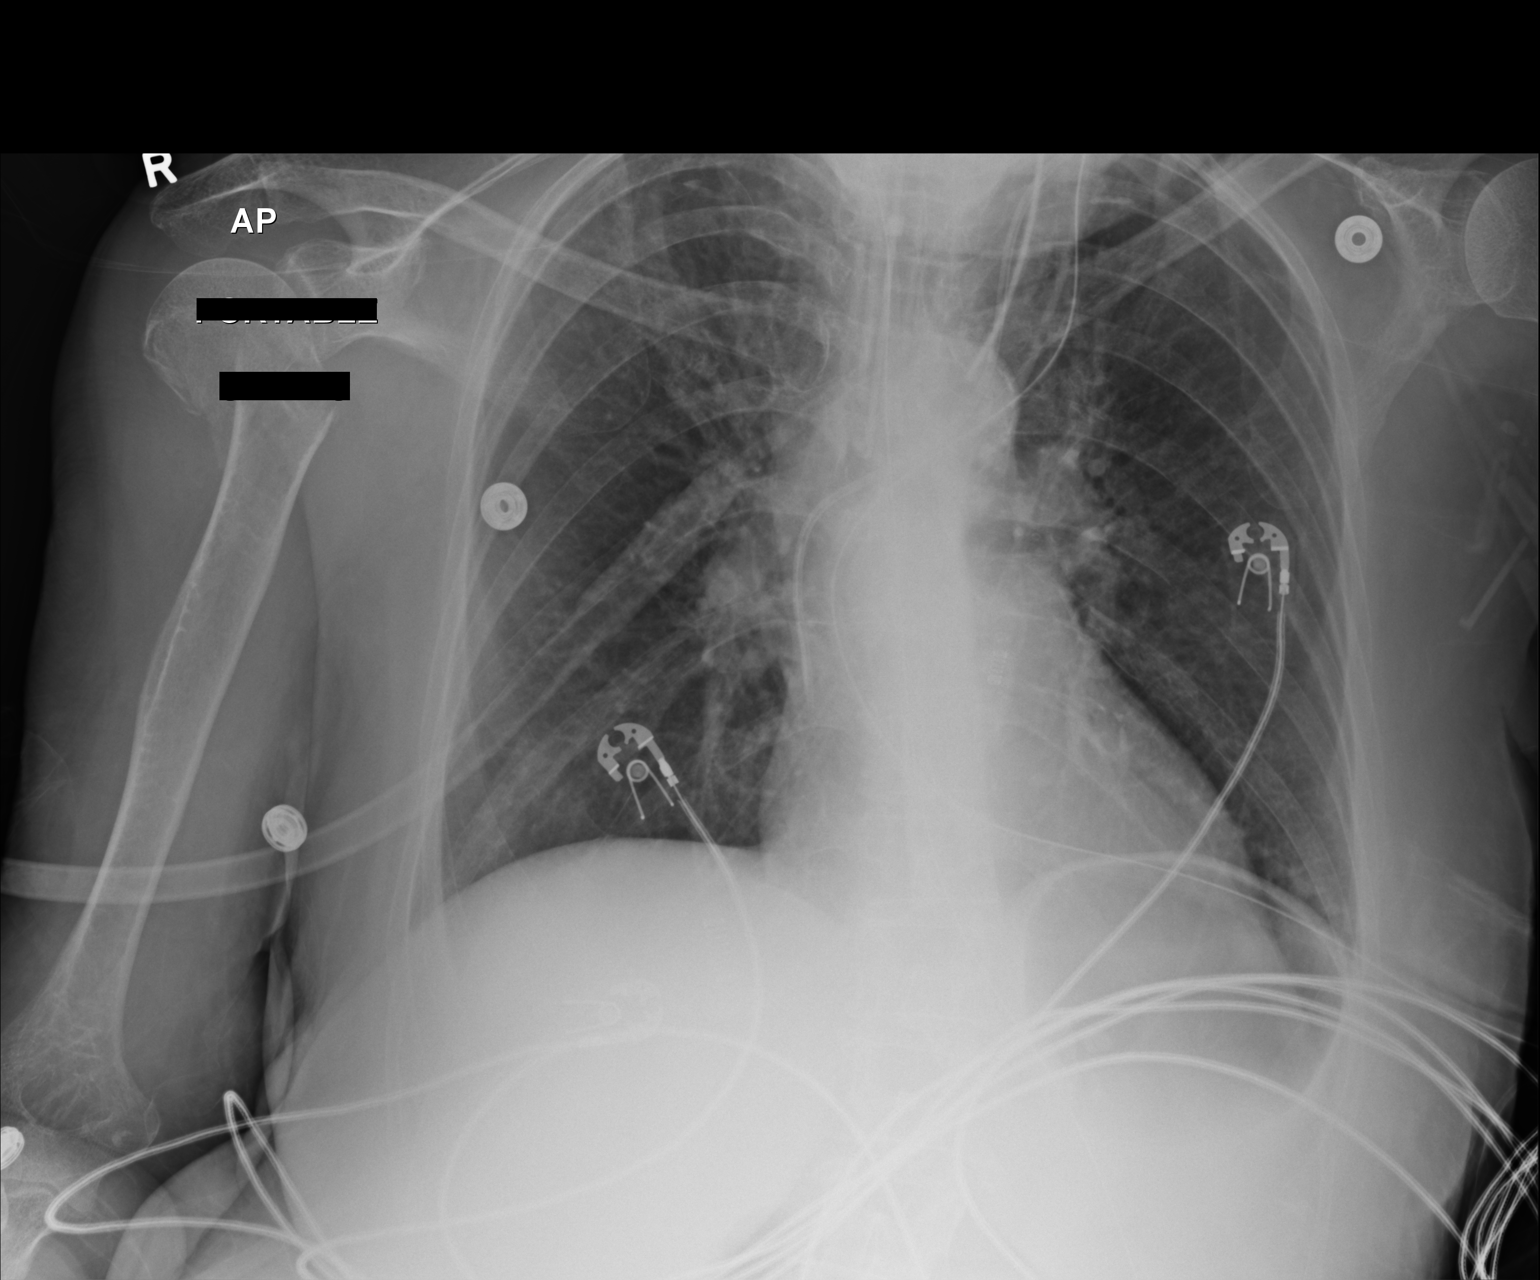

[1 of 1 positions shown; findings below may reference images not displayed]

FINDINGS: Endotracheal tube tip is now 7 mm above the carina. Internal jugular
line has been retracted with tip now over the brachiocephalic vein
on the left. There are 2 left internal jugular central lines, 1 with
tip over the cavoatrial junction and the other with tip over the
brachiocephalic vein on the left. No pneumothorax. Heart size is
normal. Lungs are clear.
IMPRESSION: Lines and tubes as described

## 2015-06-20 IMAGING — CR DG CHEST 1V PORT
1 series · 1 of 1 positions shown · non-contrast
Comparison: Chest x-ray 08/20/2014.  As CT chest 01/20/2014.

CLINICAL DATA: Cough and confusion.

EXAM:
PORTABLE CHEST - 1 VIEW

[AP]
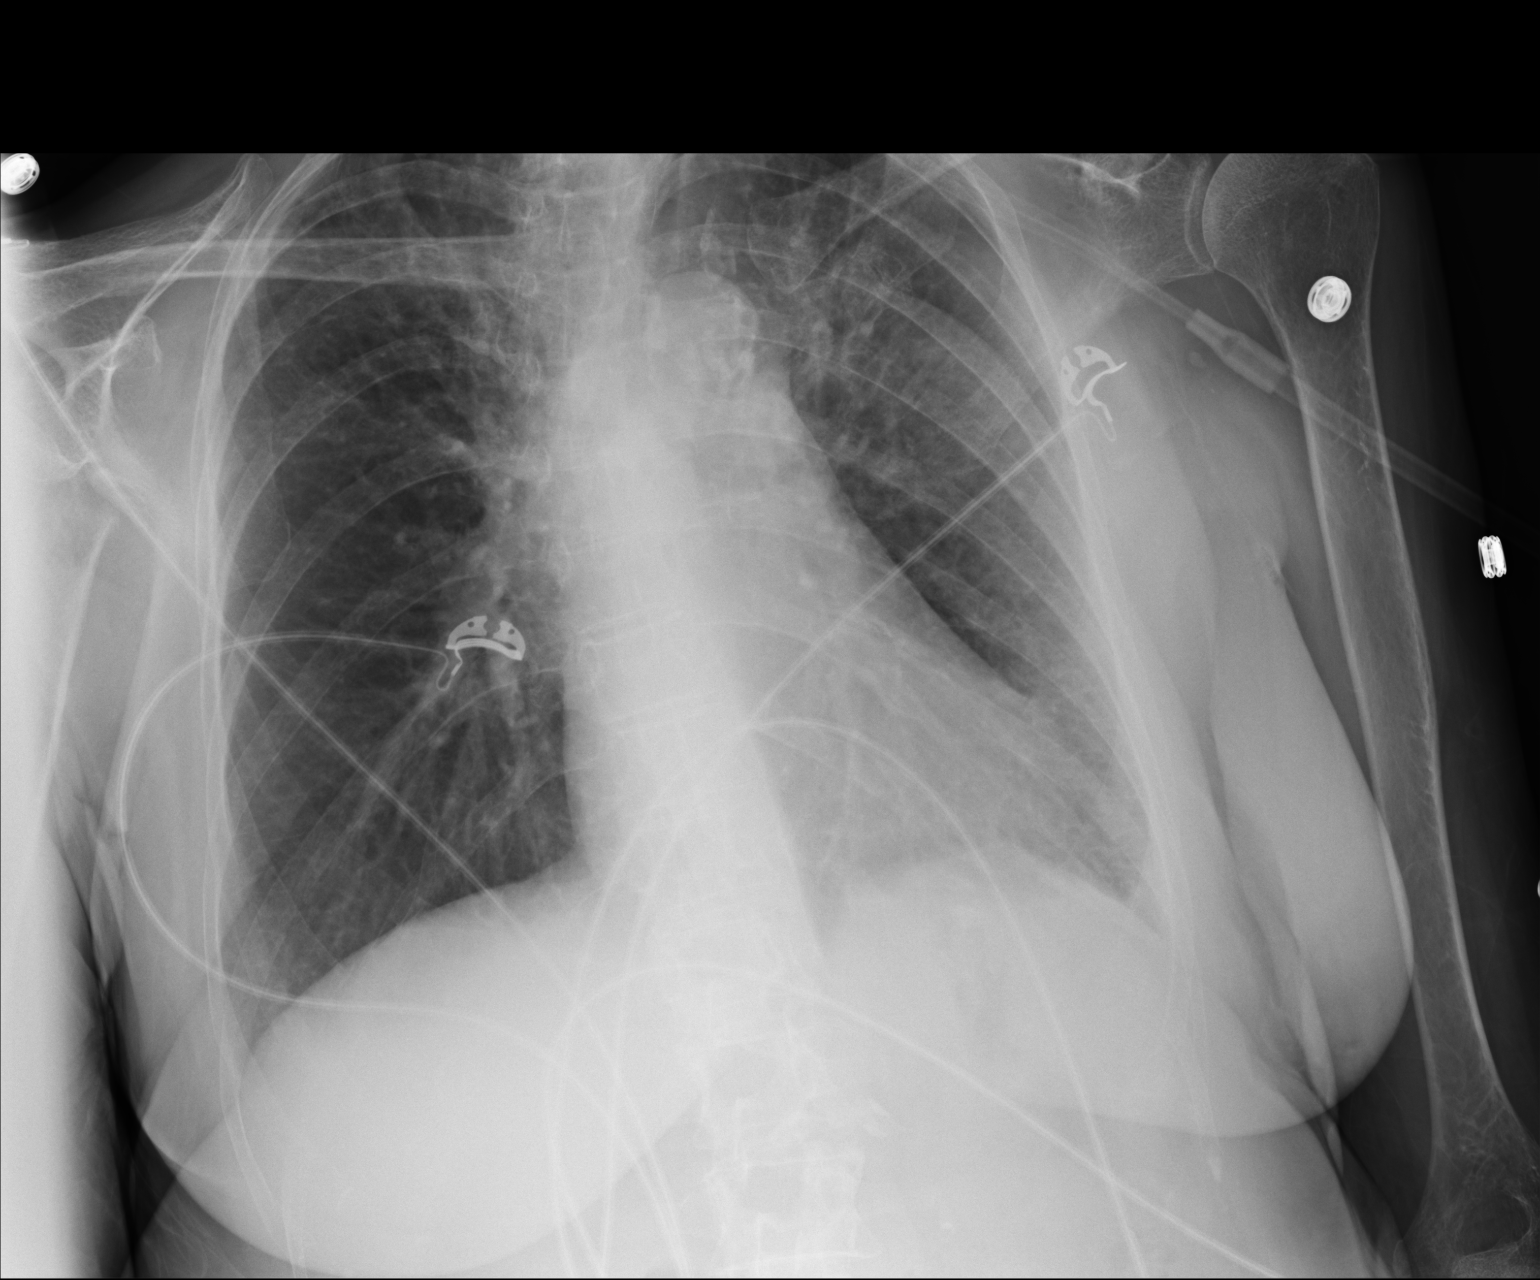

[1 of 1 positions shown; findings below may reference images not displayed]

FINDINGS: Interim removal of endotracheal tube, NG tube, and IJ line.
Mediastinum and hilar structures are normal. Mild bilateral upper
lobe infiltrates noted consistent with pneumonia. Small left base
infiltrate cannot be excluded. Small left pleural effusion. No
pneumothorax. Mild cardiomegaly with normal pulmonary vascularity.
No acute bony abnormality.
IMPRESSION: 1. Interim removal of endotracheal tube, NG tube, and IJ line.
2. Mild bilateral upper lobe and probable left lower lobe
infiltrates.
3. Small left pleural effusion.
